# Patient Record
Sex: Male | Born: 1945 | Race: White | Hispanic: No | Smoking: Former smoker
Health system: Southern US, Community
[De-identification: ages and names within clinical notes are randomized; demographics above are authoritative.]

## PROBLEM LIST (undated history)

## (undated) DIAGNOSIS — K219 Gastro-esophageal reflux disease without esophagitis: Secondary | ICD-10-CM

## (undated) DIAGNOSIS — E785 Hyperlipidemia, unspecified: Secondary | ICD-10-CM

## (undated) DIAGNOSIS — I5189 Other ill-defined heart diseases: Secondary | ICD-10-CM

## (undated) DIAGNOSIS — R269 Unspecified abnormalities of gait and mobility: Secondary | ICD-10-CM

## (undated) DIAGNOSIS — I4891 Unspecified atrial fibrillation: Secondary | ICD-10-CM

## (undated) DIAGNOSIS — I429 Cardiomyopathy, unspecified: Secondary | ICD-10-CM

## (undated) DIAGNOSIS — B019 Varicella without complication: Secondary | ICD-10-CM

## (undated) DIAGNOSIS — D689 Coagulation defect, unspecified: Secondary | ICD-10-CM

## (undated) DIAGNOSIS — J301 Allergic rhinitis due to pollen: Secondary | ICD-10-CM

## (undated) DIAGNOSIS — S92901A Unspecified fracture of right foot, initial encounter for closed fracture: Secondary | ICD-10-CM

## (undated) DIAGNOSIS — M1990 Unspecified osteoarthritis, unspecified site: Secondary | ICD-10-CM

## (undated) DIAGNOSIS — M5030 Other cervical disc degeneration, unspecified cervical region: Secondary | ICD-10-CM

## (undated) DIAGNOSIS — S92919A Unspecified fracture of unspecified toe(s), initial encounter for closed fracture: Secondary | ICD-10-CM

## (undated) DIAGNOSIS — F329 Major depressive disorder, single episode, unspecified: Secondary | ICD-10-CM

## (undated) DIAGNOSIS — M2040 Other hammer toe(s) (acquired), unspecified foot: Secondary | ICD-10-CM

## (undated) DIAGNOSIS — R011 Cardiac murmur, unspecified: Secondary | ICD-10-CM

## (undated) DIAGNOSIS — G629 Polyneuropathy, unspecified: Secondary | ICD-10-CM

## (undated) DIAGNOSIS — I00 Rheumatic fever without heart involvement: Secondary | ICD-10-CM

## (undated) DIAGNOSIS — Q667 Congenital pes cavus, unspecified foot: Secondary | ICD-10-CM

## (undated) DIAGNOSIS — M5090 Cervical disc disorder, unspecified, unspecified cervical region: Secondary | ICD-10-CM

## (undated) DIAGNOSIS — L0501 Pilonidal cyst with abscess: Secondary | ICD-10-CM

## (undated) DIAGNOSIS — R001 Bradycardia, unspecified: Secondary | ICD-10-CM

## (undated) DIAGNOSIS — D485 Neoplasm of uncertain behavior of skin: Secondary | ICD-10-CM

## (undated) DIAGNOSIS — H9190 Unspecified hearing loss, unspecified ear: Secondary | ICD-10-CM

## (undated) DIAGNOSIS — J302 Other seasonal allergic rhinitis: Secondary | ICD-10-CM

## (undated) DIAGNOSIS — N400 Enlarged prostate without lower urinary tract symptoms: Secondary | ICD-10-CM

## (undated) DIAGNOSIS — R972 Elevated prostate specific antigen [PSA]: Secondary | ICD-10-CM

## (undated) DIAGNOSIS — F32A Depression, unspecified: Secondary | ICD-10-CM

## (undated) DIAGNOSIS — H9319 Tinnitus, unspecified ear: Secondary | ICD-10-CM

## (undated) HISTORY — DX: Cardiac murmur, unspecified: R01.1

## (undated) HISTORY — DX: Major depressive disorder, single episode, unspecified: F32.9

## (undated) HISTORY — DX: Coagulation defect, unspecified: D68.9

## (undated) HISTORY — DX: Unspecified fracture of unspecified toe(s), initial encounter for closed fracture: S92.919A

## (undated) HISTORY — DX: Varicella without complication: B01.9

## (undated) HISTORY — DX: Gastro-esophageal reflux disease without esophagitis: K21.9

## (undated) HISTORY — DX: Neoplasm of uncertain behavior of skin: D48.5

## (undated) HISTORY — DX: Tinnitus, unspecified ear: H93.19

## (undated) HISTORY — DX: Polyneuropathy, unspecified: G62.9

## (undated) HISTORY — DX: Hyperlipidemia, unspecified: E78.5

## (undated) HISTORY — DX: Cardiomyopathy, unspecified: I42.9

## (undated) HISTORY — DX: Bradycardia, unspecified: R00.1

## (undated) HISTORY — DX: Other cervical disc degeneration, unspecified cervical region: M50.30

## (undated) HISTORY — DX: Unspecified fracture of right foot, initial encounter for closed fracture: S92.901A

## (undated) HISTORY — DX: Allergic rhinitis due to pollen: J30.1

## (undated) HISTORY — DX: Other hammer toe(s) (acquired), unspecified foot: M20.40

## (undated) HISTORY — DX: Other ill-defined heart diseases: I51.89

## (undated) HISTORY — DX: Benign prostatic hyperplasia without lower urinary tract symptoms: N40.0

## (undated) HISTORY — DX: Cervical disc disorder, unspecified, unspecified cervical region: M50.90

## (undated) HISTORY — DX: Rheumatic fever without heart involvement: I00

## (undated) HISTORY — DX: Pilonidal cyst with abscess: L05.01

## (undated) HISTORY — DX: Unspecified hearing loss, unspecified ear: H91.90

## (undated) HISTORY — DX: Depression, unspecified: F32.A

## (undated) HISTORY — DX: Unspecified abnormalities of gait and mobility: R26.9

## (undated) HISTORY — DX: Unspecified atrial fibrillation: I48.91

## (undated) HISTORY — DX: Unspecified osteoarthritis, unspecified site: M19.90

## (undated) HISTORY — PX: INGUINAL HERNIA REPAIR: SUR1180

## (undated) HISTORY — DX: Congenital pes cavus, unspecified foot: Q66.70

## (undated) HISTORY — DX: Elevated prostate specific antigen (PSA): R97.20

## (undated) HISTORY — PX: WISDOM TOOTH EXTRACTION: SHX21

## (undated) HISTORY — DX: Other seasonal allergic rhinitis: J30.2

## (undated) HISTORY — PX: COLONOSCOPY: SHX174

---
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
## 1964-04-08 HISTORY — PX: PILONIDAL CYST EXCISION: SHX744

## 2009-12-09 ENCOUNTER — Emergency Department (HOSPITAL_COMMUNITY): Admission: EM | Admit: 2009-12-09 | Discharge: 2009-12-09 | Payer: Self-pay | Admitting: Family Medicine

## 2010-12-02 ENCOUNTER — Emergency Department (HOSPITAL_COMMUNITY)
Admission: EM | Admit: 2010-12-02 | Discharge: 2010-12-02 | Disposition: A | Discharge status: Home / Self Care | Payer: Medicare Other | Attending: Emergency Medicine | Admitting: Emergency Medicine

## 2010-12-02 ENCOUNTER — Emergency Department (HOSPITAL_COMMUNITY): Payer: Medicare Other

## 2010-12-02 DIAGNOSIS — R112 Nausea with vomiting, unspecified: Secondary | ICD-10-CM | POA: Insufficient documentation

## 2010-12-02 DIAGNOSIS — R42 Dizziness and giddiness: Secondary | ICD-10-CM | POA: Insufficient documentation

## 2015-12-12 ENCOUNTER — Ambulatory Visit (INDEPENDENT_AMBULATORY_CARE_PROVIDER_SITE_OTHER): Payer: Medicare PPO | Admitting: Adult Health

## 2015-12-12 ENCOUNTER — Encounter: Payer: Self-pay | Admitting: Adult Health

## 2015-12-12 VITALS — BP 130/84 | Temp 98.6°F | Ht 73.0 in | Wt 193.7 lb

## 2015-12-12 DIAGNOSIS — R011 Cardiac murmur, unspecified: Secondary | ICD-10-CM | POA: Diagnosis not present

## 2015-12-12 DIAGNOSIS — E785 Hyperlipidemia, unspecified: Secondary | ICD-10-CM | POA: Diagnosis not present

## 2015-12-12 DIAGNOSIS — Z7189 Other specified counseling: Secondary | ICD-10-CM

## 2015-12-12 DIAGNOSIS — Z7689 Persons encountering health services in other specified circumstances: Secondary | ICD-10-CM

## 2015-12-12 NOTE — Progress Notes (Signed)
Patient presents to clinic today to establish care. He is a pleasant 70 year old male who  has a past medical history of Arthritis; Chicken pox; Depression; Fracture of foot bone, right, closed (1964); GERD (gastroesophageal reflux disease); Heart murmur; Rheumatic fever; and Seasonal allergies.   His last physical was in August 2017.   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 He denies any CP, SOB, or palpitations. He does have a history of rheumatic fever.   Elevated Cholesterol  - During his last office visit at the [facility] ( 10/2015) it was found that his Triglycerides were 303. He endorses that this is from his decrease in physical activity and poor diet over the last year due to having to take care of his wife. He has since started to exercise and eat healthier.    Health Maintenance: Dental -- Routine  Vision --2015  Immunizations -- UTD  Colonoscopy -- 2008   If Followed by  - Neurology - at the [facility] - Dermatology - at the [facility]    Past Medical History:  Diagnosis Date  . Arthritis   . Chicken pox   . Depression   . Fracture of foot bone, right, closed 1964  . GERD (gastroesophageal reflux disease)   . Heart murmur   . Rheumatic fever   . Seasonal allergies     Past Surgical History:  Procedure Laterality Date  . WISDOM TOOTH EXTRACTION      No current outpatient prescriptions on file prior to visit.   No current facility-administered medications on file prior to visit.     No Known Allergies  Family History  Problem Relation Age of Onset  . Arthritis Mother   . Cancer Mother   . Arthritis Father   . Cancer Father   . Heart disease Father   . Stroke Father   . Hypertension Father   . Stroke Paternal Grandfather     Social History   Social History  . Marital status: Married    Spouse name: N/A  . Number of children: N/A  . Years of education: N/A     Occupational History  . Not on file.   Social History Main Topics  . Smoking status: Former Research scientist (life sciences)  . Smokeless tobacco: Former Systems developer  . Alcohol use Yes  . Drug use: Unknown  . Sexual activity: Not on file   Other Topics Concern  . Not on file   Social History Narrative  . No narrative on file    Review of Systems  Constitutional: Negative.   HENT: Negative.   Respiratory: Negative.   Cardiovascular: Negative.   Genitourinary: Negative.   Musculoskeletal: Negative.   Skin: Negative.   Neurological: Negative.   Psychiatric/Behavioral: Negative.   All other systems reviewed and are negative.   BP 130/84   Temp 98.6 F (37 C) (Oral)   Ht 6\' 1"  (1.854 m)   Wt 193 lb 11.2 oz (87.9 kg)   BMI 25.56 kg/m   Physical Exam  Constitutional: He is oriented to person, place, and time and well-developed, well-nourished, and in no distress. No distress.  HENT:  Head: Normocephalic and atraumatic.  Right Ear: External ear normal.  Left Ear: External ear normal.  Nose: Nose normal.  Mouth/Throat: Oropharynx is clear and moist. No oropharyngeal exudate.  Eyes: Conjunctivae and EOM are normal. Pupils are equal, round, and reactive to  light. Right eye exhibits no discharge. Left eye exhibits no discharge.  Cardiovascular: Normal rate, regular rhythm and intact distal pulses.  Exam reveals no gallop and no friction rub.   Murmur (slight systolic murmur ) heard. Pulmonary/Chest: Effort normal and breath sounds normal.  Abdominal: Soft. Bowel sounds are normal. He exhibits no distension and no mass. There is no tenderness. There is no rebound and no guarding.  Musculoskeletal: Normal range of motion. He exhibits no edema, tenderness or deformity.  Neurological: He is alert and oriented to person, place, and time. He has normal reflexes. He displays normal reflexes. No cranial nerve deficit. He exhibits normal muscle tone. Gait normal. Coordination normal. GCS score is 15.  Skin: Skin  is warm and dry. No rash noted. He is not diaphoretic. No erythema. No pallor.  Psychiatric: Mood, memory, affect and judgment normal.  Nursing note and vitals reviewed.   Assessment/Plan: 1. Encounter to establish care - Reviewed labs from his previous [facility] appointment. THese results were scanned in - Follow up for CPE - Follow up sooner for any acute issues - Encouraged heart healthy diet and exercise  2. Heart murmur - Referral to cardiology   3. Hyperlipidemia - Will recheck in 4 months  - Encouraged heart healthy diet and exercise - Consider placing on statin.   [name], NP

## 2015-12-12 NOTE — Patient Instructions (Signed)
It was great seeing you today   Someone from Cardiology will contact you to set up an appointment.   Please follow up with me in 4 months or so to have your cholesterol rechecked.   Follow up in July 2018 for your next physical. If you need anything in the meantime, please let me know.

## 2016-01-15 ENCOUNTER — Ambulatory Visit (INDEPENDENT_AMBULATORY_CARE_PROVIDER_SITE_OTHER): Payer: Medicare PPO | Admitting: Cardiovascular Disease

## 2016-01-15 ENCOUNTER — Encounter: Payer: Self-pay | Admitting: Cardiovascular Disease

## 2016-01-15 VITALS — BP 122/68 | HR 65 | Ht 73.0 in | Wt 197.0 lb

## 2016-01-15 DIAGNOSIS — R011 Cardiac murmur, unspecified: Secondary | ICD-10-CM | POA: Insufficient documentation

## 2016-01-15 DIAGNOSIS — R9431 Abnormal electrocardiogram [ECG] [EKG]: Secondary | ICD-10-CM | POA: Insufficient documentation

## 2016-01-15 DIAGNOSIS — R0602 Shortness of breath: Secondary | ICD-10-CM

## 2016-01-15 DIAGNOSIS — R0600 Dyspnea, unspecified: Secondary | ICD-10-CM | POA: Insufficient documentation

## 2016-01-15 DIAGNOSIS — E781 Pure hyperglyceridemia: Secondary | ICD-10-CM | POA: Diagnosis not present

## 2016-01-15 NOTE — Patient Instructions (Signed)
Your physician has requested that you have an echocardiogram. Echocardiography is a painless test that uses sound waves to create images of your heart. It provides your doctor with information about the size and shape of your heart and how well your heart's chambers and valves are working. This procedure takes approximately one hour. There are no restrictions for this procedure. This will be performed at our [facility] location - [address].  Dr Sallyanne Kuster recommends that you schedule a follow-up appointment in 2 months.  If you need a refill on your cardiac medications before your next appointment, please call your pharmacy.

## 2016-01-15 NOTE — Progress Notes (Signed)
Cardiology Consultation Note    Date:  01/15/2016   ID:  Marc Thornton, DOB 09-Feb-1946, MRN [id number]  PCP:  [name], NP  Cardiologist:   Sanda Klein, MD  Reason for consultation: murmur  Chief complaint: new murmur   History of Present Illness:  Marc Thornton is a 70 y.o. male without previous known cardiac illness, recently found to have a murmur on physical exam by his primary care provider. He reports that this has not been detected in the past.  He tries to minimize his symptoms, but he has been mowing his 3/4-acre lawn a quarter acre every day, since he develops shortness of breath and a twinge of discomfort in his chest if he tries to do more. It sounds like this has been progressively worsening slowly over the last year or so. He denies dizziness or syncope. He does not have shortness of breath or chest discomfort at rest. He insists that all his symptoms are related to poor sleep. His wife is seriously disabled and requires a lot of assistance to use the bathroom. She has nocturia so his sleep is interrupted 4-5 times every night. He denies leg edema, recent focal neurological complaints.  Marc Thornton reports that he had rheumatic fever at age 6. His father, who is a Therapist, music treated him with Pen-Vee K prophylaxis for the next many years and he did not have any exacerbations or heart injury. The only other time she has had contact with healthcare providers has been for was done to fix fraction pilonidal cyst surgery or an occasional injury. In 2012 was briefly evaluated in the emergency room for vertigo. He has otherwise been very healthy. He reports that his cholesterol has been "intermittently elevated".  He spent a year and a half in [place] in the [employer] and had some exposure to [employer]. He quit smoking 50 years ago. Between Marc Thornton and his wife they will have anywhere from a beer or 2 a day that they split. He denies use of recreational drugs. He does not  perform routine physical exercise.  There is a strong family history of cerebrovascular disease and dementia on his father's side. His paternal grandfather died at age 61 of a stroke. His father had triple bypass at age 53 and lived to be 76, after having a stroke at age 77. His mother died at age 53 from metastatic breast cancer. No family history of hypertrophic cardiomyopathy, congestive heart failure, unexplained sudden cardiac death or her ventricular arrhythmia  His ECG today shows sinus rhythm with very prominent changes of left ventricular hypertrophy and profound secondary repolarization abnormalities. There is also an unusually sudden transition to positive R waves in lead V2 and deep Q waves in lead aVL only. He reports having had electrocardiograms before, that were by his recollection all normal. He has never been told that he had an abnormal electrocardiogram.   Past Medical History:  Diagnosis Date  . Arthritis   . BPH (benign prostatic hyperplasia)   . Chicken pox   . DDD (degenerative disc disease), cervical   . Depression   . Elevated PSA   . Fracture of foot bone, right, closed 1964  . GERD (gastroesophageal reflux disease)   . Heart murmur   . Hyperlipidemia   . Polyneuropathy ([place])   . Rheumatic fever   . Seasonal allergies     Past Surgical History:  Procedure Laterality Date  . WISDOM TOOTH EXTRACTION      Current  Medications: No outpatient prescriptions prior to visit.   No facility-administered medications prior to visit.      Allergies:   Review of patient's allergies indicates no known allergies.   Social History   Social History  . Marital status: Married    Spouse name: N/A  . Number of children: N/A  . Years of education: N/A   Social History Main Topics  . Smoking status: Former Research scientist (life sciences)  . Smokeless tobacco: Former Systems developer  . Alcohol use Yes  . Drug use: No  . Sexual activity: Not Asked   Other Topics Concern  . None   Social History  Narrative   Worked as a Community education officer    Married           Family History:  The patient's family history includes Arthritis in his father and mother; Breast cancer in his mother; Dementia in his father; Heart disease in his father; Hypertension in his father; Prostate cancer in his father; Stroke in his father and paternal grandfather.   ROS:   Please see the history of present illness.    ROS All other systems reviewed and are negative.   PHYSICAL EXAM:   VS:  BP 122/68 (BP Location: Right Arm, Patient Position: Sitting, Cuff Size: Normal)   Pulse 65   Ht 6\' 1"  (1.854 m)   Wt 197 lb (89.4 kg)   SpO2 95%   BMI 25.99 kg/m    GEN: Well nourished, well developed, in no acute distress  HEENT: normal  Neck: no JVD, carotid bruits, or masses Cardiac: RRR; 2/6 mid peaking systolic ejection murmur heard best at the left lower sternal border, that appears to slightly diminish with handgrip and increase with Valsalva maneuver ; no diastolic murmurs, rubs, or gallops,no edema  Respiratory:  clear to auscultation bilaterally, normal work of breathing GI: soft, nontender, nondistended, + BS MS: no deformity or atrophy  Skin: warm and dry, no rash Neuro:  Alert and Oriented x 3, Strength and sensation are intact Psych: euthymic mood, full affect  Wt Readings from Last 3 Encounters:  01/15/16 197 lb (89.4 kg)  12/12/15 193 lb 11.2 oz (87.9 kg)      Studies/Labs Reviewed:   EKG:  EKG is ordered today.  The ekg ordered today demonstrates Normal sinus rhythm, prominent left ventricular hypertrophy, QRS 100 ms, sudden are as transition from V1 to V2, deep Q wave in aVL, prominent secondary repolarization abnormalities, normal QTC 405 ms.  LABS: [facility] 10/20/2015 Cholesterol 178, triglycerides 303, HDL 43, LDL 74, hemoglobin A1c 5.4%    ASSESSMENT:    1. Heart murmur   2. Shortness of breath   3. Abnormal ECG   4. Hypertriglyceridemia      PLAN:  In order of problems listed  above:  1. Murmur: I think the differential diagnosis is between valvular aortic stenosis and hypertrophic obstructive cardiomyopathy. Both his symptoms and the electrocardiogram could be explained by both. There is some dynamic component to his murmur, but not as convincing as one would expect with true HOCM. We should differently start with an echocardiogram. He should not have a stress test until after his echo. 2. Dyspnea: Most likely represents diastolic heart failure related to valvular heart disease and/or diastolic dysfunction. Echo will help. I am worried that Marc Thornton is minimizing his symptoms since he does not want to acknowledge being sick. He is aware of how important is to the care of his wife. 3. Abnormal ECG: He does not have  hypertension to explain the LVH changes. 4. HyperTG: In the absence of diabetes mellitus or known vascular disease, his mild-moderate hypertriglyceridemia does not require pharmacological therapy. He should limit his intake of sugars and starches and saturated fats and perform daily physical exercise. You exercise prescription after we have the results of his echo.    Medication Adjustments/Labs and Tests Ordered: Current medicines are reviewed at length with the patient today.  Concerns regarding medicines are outlined above.  Medication changes, Labs and Tests ordered today are listed in the Patient Instructions below. Patient Instructions  Your physician has requested that you have an echocardiogram. Echocardiography is a painless test that uses sound waves to create images of your heart. It provides your doctor with information about the size and shape of your heart and how well your heart's chambers and valves are working. This procedure takes approximately one hour. There are no restrictions for this procedure. This will be performed at our [facility] location - [address], [address].  Dr Sallyanne Kuster recommends that you schedule a follow-up appointment  in 2 months.  If you need a refill on your cardiac medications before your next appointment, please call your pharmacy.    Signed, Sanda Klein, MD  01/15/2016 6:00 PM    [address], [place], [place]  [address] Phone: [phone]; Fax: [phone]

## 2016-01-30 ENCOUNTER — Other Ambulatory Visit: Payer: Self-pay

## 2016-01-30 ENCOUNTER — Ambulatory Visit (HOSPITAL_COMMUNITY): Payer: Medicare PPO | Attending: Cardiology

## 2016-01-30 DIAGNOSIS — R0602 Shortness of breath: Secondary | ICD-10-CM | POA: Insufficient documentation

## 2016-01-30 DIAGNOSIS — R011 Cardiac murmur, unspecified: Secondary | ICD-10-CM | POA: Diagnosis not present

## 2016-02-02 ENCOUNTER — Telehealth: Payer: Self-pay | Admitting: Cardiovascular Disease

## 2016-02-02 DIAGNOSIS — R011 Cardiac murmur, unspecified: Secondary | ICD-10-CM

## 2016-02-02 NOTE — Telephone Encounter (Signed)
I did not order a Lexiscan. I ordered a treadmill Myoview.  A simple treadmill ECG test without imaging will not suffice,

## 2016-02-02 NOTE — Telephone Encounter (Signed)
Pt is calling to get test results

## 2016-02-02 NOTE — Telephone Encounter (Signed)
Spoke with pt and gave him results. He states that he can do the treadmill testing and would like to try to do it. And not the lexiscan. FYI

## 2016-02-05 NOTE — Telephone Encounter (Signed)
PT NOTIFIED  

## 2016-02-16 ENCOUNTER — Telehealth (HOSPITAL_COMMUNITY): Payer: Self-pay

## 2016-02-16 NOTE — Telephone Encounter (Signed)
Encounter complete. 

## 2016-02-21 ENCOUNTER — Inpatient Hospital Stay (HOSPITAL_COMMUNITY): Admission: RE | Admit: 2016-02-21 | Payer: Medicare PPO | Source: Ambulatory Visit

## 2016-02-23 ENCOUNTER — Telehealth (HOSPITAL_COMMUNITY): Payer: Self-pay

## 2016-02-23 NOTE — Telephone Encounter (Signed)
Encounter complete. 

## 2016-02-28 ENCOUNTER — Ambulatory Visit (HOSPITAL_COMMUNITY)
Admission: RE | Admit: 2016-02-28 | Discharge: 2016-02-28 | Disposition: A | Discharge status: Home / Self Care | Payer: Medicare PPO | Source: Ambulatory Visit | Attending: Internal Medicine | Admitting: Internal Medicine

## 2016-02-28 DIAGNOSIS — R011 Cardiac murmur, unspecified: Secondary | ICD-10-CM | POA: Insufficient documentation

## 2016-02-28 LAB — MYOCARDIAL PERFUSION IMAGING
CHL CUP MPHR: 149 {beats}/min
CHL CUP NUCLEAR SDS: 4
CHL CUP NUCLEAR SSS: 4
CHL RATE OF PERCEIVED EXERTION: 17
CSEPED: 4 min
CSEPEW: 5.6 METS
Exercise duration (sec): 33 s
LV dias vol: 111 mL (ref 62–150)
LVSYSVOL: 51 mL
NUC STRESS TID: 1.11
Peak HR: 137 {beats}/min
Percent HR: 91 %
Rest HR: 52 {beats}/min
SRS: 0

## 2016-02-28 MED ORDER — TECHNETIUM TC 99M TETROFOSMIN IV KIT
9.8000 | PACK | Freq: Once | INTRAVENOUS | Status: AC | PRN
  Administered 2016-02-28: 9.8 via INTRAVENOUS
  Filled 2016-02-28: qty 10

## 2016-02-28 MED ORDER — TECHNETIUM TC 99M TETROFOSMIN IV KIT
31.6000 | PACK | Freq: Once | INTRAVENOUS | Status: AC | PRN
  Administered 2016-02-28: 31.6 via INTRAVENOUS
  Filled 2016-02-28: qty 32

## 2016-03-21 ENCOUNTER — Ambulatory Visit (INDEPENDENT_AMBULATORY_CARE_PROVIDER_SITE_OTHER): Payer: Medicare PPO | Admitting: Cardiovascular Disease

## 2016-03-21 ENCOUNTER — Encounter: Payer: Self-pay | Admitting: Cardiovascular Disease

## 2016-03-21 VITALS — BP 130/80 | HR 86 | Ht 73.0 in | Wt 198.4 lb

## 2016-03-21 DIAGNOSIS — R9431 Abnormal electrocardiogram [ECG] [EKG]: Secondary | ICD-10-CM

## 2016-03-21 DIAGNOSIS — R011 Cardiac murmur, unspecified: Secondary | ICD-10-CM | POA: Diagnosis not present

## 2016-03-21 DIAGNOSIS — E852 Heredofamilial amyloidosis, unspecified: Secondary | ICD-10-CM | POA: Diagnosis not present

## 2016-03-21 DIAGNOSIS — E781 Pure hyperglyceridemia: Secondary | ICD-10-CM

## 2016-03-21 DIAGNOSIS — Z01812 Encounter for preprocedural laboratory examination: Secondary | ICD-10-CM

## 2016-03-21 DIAGNOSIS — I5032 Chronic diastolic (congestive) heart failure: Secondary | ICD-10-CM | POA: Diagnosis not present

## 2016-03-21 NOTE — Patient Instructions (Addendum)
Medication Instructions: Dr Sallyanne Kuster recommends that you continue on your current medications as directed. Please refer to the Current Medication list given to you today.  Labwork: Your physician recommends that you return for lab work prior to Cardiac MRI - within 30 days.  Testing/Procedures: 1. 24-hour Blood Pressure Monitor - This will be placed at our [facility] office - [address] [place] physician has requested that you have a cardiac MRI. Cardiac MRI uses a computer to create images of your heart as its beating, producing both still and moving pictures of your heart and major blood vessels. For further information please visit [email or web address] Please follow the instruction sheet given to you today for more information. This will be performed at [facility].  Follow-up: Dr Sallyanne Kuster recommends that you schedule a follow-up appointment in 3 months.  If you need a refill on your cardiac medications before your next appointment, please call your pharmacy.

## 2016-03-21 NOTE — Progress Notes (Signed)
Cardiology Office Note    Date:  03/21/2016   ID:  Marc Thornton, DOB 10-25-1945, MRN [id number]  PCP:  [name], NP  Cardiologist:   Sanda Klein, MD   Chief complaint: Follow-up echo and stress test   History of Present Illness:  Marc Thornton is a 70 y.o. male without previous known cardiac illness, recently found to have a murmur on physical exam by his primary care provider. Returns to follow-up on the results of his echocardiogram and nuclear treadmill stress test  The nuclear perfusion images were normal. He was only able to exercise for 4-1/2 minutes (5.6 METS) before he reached a heart rate of 149 bpm. He had a hypertensive response to exercise with a diastolic blood pressure that increased to 107 mmHg. Exercise performance was good. His echocardiogram did not show evidence of hypertrophic obstructive cardiomyopathy or valvular aortic stenosis. Left ventricular ejection fraction was normal. There was indeed evidence of diastolic dysfunction. The mitral annulus diastolic velocities were markedly reduced and the E/e' ratio was borderline significant for elevated filling pressures. Surprisingly, the measurements did not show thickening of the ventricular walls or left atrial enlargement.  He spent a year and a half in [place] in the [employer] and had some exposure to [employer]. He quit smoking 50 years ago.   There is a strong family history of cerebrovascular disease and dementia on his father's side. His paternal grandfather died at age 57 of a stroke. His father had triple bypass at age 6 and lived to be 19, after having a stroke at age 58. His mother died at age 18 from metastatic breast cancer. No family history of hypertrophic cardiomyopathy, congestive heart failure, unexplained sudden cardiac death or her ventricular arrhythmia. He believes his father had amyloid angiopathy and whether this might be hereditary.  His ECG today shows sinus rhythm with very prominent changes  of left ventricular hypertrophy and profound secondary repolarization abnormalities. There is also an unusually sudden transition to positive R waves in lead V2 and deep Q waves in lead aVL only. He reports having had electrocardiograms before, that were by his recollection all normal. He has never been told that he had an abnormal electrocardiogram.   Past Medical History:  Diagnosis Date  . Arthritis   . BPH (benign prostatic hyperplasia)   . Chicken pox   . DDD (degenerative disc disease), cervical   . Depression   . Elevated PSA   . Fracture of foot bone, right, closed 1964  . GERD (gastroesophageal reflux disease)   . Heart murmur   . Hyperlipidemia   . Polyneuropathy ([place])   . Rheumatic fever   . Seasonal allergies     Past Surgical History:  Procedure Laterality Date  . WISDOM TOOTH EXTRACTION      Current Medications: Outpatient Medications Prior to Visit  Medication Sig Dispense Refill  . aspirin 325 MG tablet Take 325 mg by mouth daily.    Marland Kitchen ibuprofen (ADVIL,MOTRIN) 200 MG tablet Take 200 mg by mouth every 6 (six) hours as needed.     No facility-administered medications prior to visit.      Allergies:   Patient has no known allergies.   Social History   Social History  . Marital status: Married    Spouse name: N/A  . Number of children: N/A  . Years of education: N/A   Social History Main Topics  . Smoking status: Former Research scientist (life sciences)  . Smokeless tobacco: Former Systems developer  . Alcohol  use Yes  . Drug use: No  . Sexual activity: Not Asked   Other Topics Concern  . None   Social History Narrative   Worked as a Community education officer    Married           Family History:  The patient's family history includes Arthritis in his father and mother; Breast cancer in his mother; Dementia in his father; Heart disease in his father; Hypertension in his father; Prostate cancer in his father; Stroke in his father and paternal grandfather.   ROS:   Please see the history of present  illness.    ROS All other systems reviewed and are negative.   PHYSICAL EXAM:   VS:  BP 130/80 (BP Location: Left Arm, Patient Position: Sitting, Cuff Size: Normal)   Pulse 86   Ht 6\' 1"  (1.854 m)   Wt 198 lb 6.4 oz (90 kg)   SpO2 96%   BMI 26.18 kg/m    GEN: Well nourished, well developed, in no acute distress  HEENT: normal  Neck: no JVD, carotid bruits, or masses Cardiac: RRR; 2/6 mid peaking systolic ejection murmur heard best at the left lower sternal border, that appears to slightly diminish with handgrip and increase with Valsalva maneuver ; no diastolic murmurs, rubs, or gallops,no edema  Respiratory:  clear to auscultation bilaterally, normal work of breathing GI: soft, nontender, nondistended, + BS MS: no deformity or atrophy  Skin: warm and dry, no rash Neuro:  Alert and Oriented x 3, Strength and sensation are intact Psych: euthymic mood, full affect  Wt Readings from Last 3 Encounters:  03/21/16 198 lb 6.4 oz (90 kg)  02/28/16 197 lb (89.4 kg)  01/15/16 197 lb (89.4 kg)      Studies/Labs Reviewed:   EKG:  EKG is not ordered today.  The ekg ordered At his previous appointment demonstrates Normal sinus rhythm, prominent left ventricular hypertrophy, QRS 100 ms, sudden are as transition from V1 to V2, deep Q wave in aVL, prominent secondary repolarization abnormalities, normal QTC 405 ms.  LABS: [facility] 10/20/2015 Cholesterol 178, triglycerides 303, HDL 43, LDL 74, hemoglobin A1c 5.4%    ASSESSMENT:    1. Chronic diastolic heart failure ([place])   2. Abnormal ECG   3. Murmur, cardiac   4. Hypertriglyceridemia   5. Amyloidosis, hereditary ([place])   6. Pre-procedure lab exam      PLAN:  In order of problems listed above:   1. CHF: his dyspnea most likely represents diastolic heart failure related to diastolic dysfunction. I think the treadmill tests confirm that his exercise capacity is more limited than he is willing to admit. What remains unclear is  whether or not this is simply due to latent and untreated hypertension (more likely from a statistical point of view) or due to an infiltrative cardiomyopathy such as amyloidosis (a diagnosis which he favors and which he has researched intensively). I have told him that typically with amyloidosis the electrocardiogram shows low voltage, and that his ECG shows the typical high-voltage of left ventricular hypertrophy. I think it is more likely that he has high blood pressure heart changes or even a primary hypertrophic cardiomyopathy. We'll schedule for cardiac MRI and a 24-hour blood pressure monitor. He would like referral to a free genetic screening test for hATTR amyloidosis via [place]. Since there is no evidence of LV outflow tract obstruction, but he does have some suggestion of diastolic heart failure, I would recommend initial treatment with a diuretic such  as chlorthalidone. He at least should limit increased sodium intake in his diet, which he admits is a problem.  2. Abnormal ECG: He does not have resting hypertension, but does have a hypertensive response to exercise which might explain the LVH changes. 3. Murmur: There was no evidence of left ventricular outflow tract obstruction or systolic anterior motion of the mitral valve on his echocardiogram. There was also no evidence of valvular aortic stenosis. Provocative maneuvers were not performed, suspect his murmur simply represent aortic valve sclerosis. 4. HyperTG: In the absence of diabetes mellitus or known vascular disease, his mild-moderate hypertriglyceridemia does not require pharmacological therapy. He should limit his intake of sugars and starches and saturated fats and perform daily physical exercise. You exercise prescription after we have the results of his echo. 5. Possible amyloidosis: Pointed out the fact that there is no relationship between cerebral amyloid angiopathy which is an isolated cerebrovascular phenomenon and systemic  amyloidosis. Therefore his father's medical problems likely have little bearing on his current condition. 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    Medication Adjustments/Labs and Tests Ordered: Current medicines are reviewed at length with the patient today.  Concerns regarding medicines are outlined above.  Medication changes, Labs and Tests ordered today are listed in the Patient Instructions below. Patient Instructions  Medication Instructions: Dr Sallyanne Kuster recommends that you continue on your current medications as directed. Please refer to the Current Medication list given to you today.  Labwork: Your physician recommends that you return for lab work prior to Cardiac MRI - within 30 days.  Testing/Procedures: 1. 24-hour Blood Pressure Monitor - This will be placed at our [facility] office - [address], [place] physician has requested that you have a cardiac MRI. Cardiac MRI uses a computer to create images of your heart as its beating, producing both still and moving pictures of your heart and major blood vessels. For further information please visit [email or web address] Please follow the instruction sheet given to you today for more information. This will be performed at [place].  Follow-up: Dr Sallyanne Kuster recommends that you schedule a follow-up appointment in 3 months.  If you need a refill on your cardiac medications before your next appointment, please call your pharmacy.    Signed, Sanda Klein, MD  03/21/2016 5:58 PM    [address], [place], [place]  [address] Phone: [phone]; Fax: [phone]

## 2016-03-26 ENCOUNTER — Encounter: Payer: Self-pay | Admitting: Cardiovascular Disease

## 2016-03-26 ENCOUNTER — Telehealth: Payer: Self-pay

## 2016-03-26 DIAGNOSIS — E781 Pure hyperglyceridemia: Secondary | ICD-10-CM

## 2016-03-26 NOTE — Telephone Encounter (Signed)
After office visit on 03/21/16, patient reported that back in the summer he had his cholesterol check and his triglycerides were elevated.  Discussed this finding with Dr C after patient was let go. Dr C okay with ordering lipid panel to be drawn with his labs for his upcoming cardiac MRI.  Additional paperwork mailed to patient.

## 2016-04-04 ENCOUNTER — Telehealth: Payer: Self-pay | Admitting: Cardiovascular Disease

## 2016-04-04 NOTE — Telephone Encounter (Signed)
Called the patient and gave him the date, time and location of cardiac MRI.

## 2016-04-09 ENCOUNTER — Ambulatory Visit (HOSPITAL_COMMUNITY): Admission: RE | Admit: 2016-04-09 | Payer: Medicare PPO | Source: Ambulatory Visit

## 2016-04-09 ENCOUNTER — Telehealth: Payer: Self-pay | Admitting: Cardiovascular Disease

## 2016-04-09 NOTE — Telephone Encounter (Signed)
Got a staff message from Mack Guise saying that she got a VM from the patient wanting to reschedule his cardiac MRI that he cancelled this morning.  He wants an afternoon appointment.  I sent a staff message to [facility] and precert to reschedule MRI.

## 2016-04-19 ENCOUNTER — Telehealth: Payer: Self-pay | Admitting: Cardiovascular Disease

## 2016-04-19 ENCOUNTER — Encounter: Payer: Self-pay | Admitting: Cardiovascular Disease

## 2016-04-19 NOTE — Telephone Encounter (Signed)
Called patient and gave him date, time and location of cardiac MRI.  Also sent message to check on precert of MRI.

## 2016-04-23 ENCOUNTER — Telehealth: Payer: Self-pay | Admitting: Cardiovascular Disease

## 2016-04-23 NOTE — Telephone Encounter (Signed)
Spoke to patient who recounted detailed narrative for my own knowledge regarding his prior tests and those findings. Notes echo showing diastolic dysfunction Notes the neg stress test but findings of hypertensive response to exercise  He had two questions/comments related to the upcoming MRI slated for 1/22. #1 - he's unclear of what additional information the MRI will provide #2 - had concern r/t his demyelinating peripheral polyneuropathy and what impact the contrast chemical will have on this.  Aware Dr. Sallyanne Kuster is out of office but he wanted his advice - voiced he will wait for response.

## 2016-04-23 NOTE — Telephone Encounter (Signed)
Marc Thornton is calling because he has some questions about his upcoming MRI . Please call

## 2016-04-26 NOTE — Telephone Encounter (Signed)
Patient came by office today, to see if he can get answer from previous question - why does he need this cardiac MRI.  2- How will it change in his diagnosis and treatment.  3- he states he was not given a full explanation about the test. 4- if the contrast will interfere with his diagnosis of  Amyloidosis.  5 cost factor?and will do labs when reschedule.  RN spoke to patient , informed Dr Sallyanne Kuster not in office , message is awaiting his return. Suggest to reschedule MRI   Until question are answered. Patient request activation code for [place] to reschedule. Both given to patient . Patient verbalized understanding .

## 2016-04-29 ENCOUNTER — Ambulatory Visit (HOSPITAL_COMMUNITY): Payer: Medicare PPO

## 2016-05-22 ENCOUNTER — Ambulatory Visit (HOSPITAL_COMMUNITY): Payer: Medicare PPO

## 2016-05-27 ENCOUNTER — Ambulatory Visit (HOSPITAL_COMMUNITY): Admission: RE | Admit: 2016-05-27 | Payer: Medicare PPO | Source: Ambulatory Visit

## 2016-05-28 ENCOUNTER — Ambulatory Visit (INDEPENDENT_AMBULATORY_CARE_PROVIDER_SITE_OTHER): Payer: Medicare PPO | Admitting: Adult Health

## 2016-05-28 ENCOUNTER — Encounter: Payer: Self-pay | Admitting: Adult Health

## 2016-05-28 VITALS — Temp 98.1°F | Wt 203.1 lb

## 2016-05-28 DIAGNOSIS — R6889 Other general symptoms and signs: Secondary | ICD-10-CM

## 2016-05-28 NOTE — Progress Notes (Signed)
Subjective:    Patient ID: Marc Thornton, male    DOB: Dec 20, 1945, 71 y.o.   MRN: [id number]  HPI  71 year old male who  has a past medical history of Arthritis; BPH (benign prostatic hyperplasia); Chicken pox; DDD (degenerative disc disease), cervical; Depression; Elevated PSA; Fracture of foot bone, right, closed (1964); GERD (gastroesophageal reflux disease); Heart murmur; Hyperlipidemia; Polyneuropathy ([place]); Rheumatic fever; and Seasonal allergies. He presents to the office today with the acute complaint of flu like symptoms that seem to be improving. His symptoms include that of non productive cough, generalized body aches, and fatigue. He denies any low grade fevers, nausea, or vomiting. He feels better today than he has in the past.   He has been using Mucinex with relief.    Review of Systems  Constitutional: Positive for activity change and fatigue.  HENT: Negative.   Respiratory: Positive for cough.   Musculoskeletal: Positive for myalgias.  All other systems reviewed and are negative.  Past Medical History:  Diagnosis Date  . Arthritis   . BPH (benign prostatic hyperplasia)   . Chicken pox   . DDD (degenerative disc disease), cervical   . Depression   . Elevated PSA   . Fracture of foot bone, right, closed 1964  . GERD (gastroesophageal reflux disease)   . Heart murmur   . Hyperlipidemia   . Polyneuropathy ([place])   . Rheumatic fever   . Seasonal allergies     Social History   Social History  . Marital status: Married    Spouse name: N/A  . Number of children: N/A  . Years of education: N/A   Occupational History  . Not on file.   Social History Main Topics  . Smoking status: Former Research scientist (life sciences)  . Smokeless tobacco: Former Systems developer  . Alcohol use Yes  . Drug use: No  . Sexual activity: Not on file   Other Topics Concern  . Not on file   Social History Narrative   Worked as a Community education officer    Married          Past Surgical History:  Procedure Laterality  Date  . WISDOM TOOTH EXTRACTION      Family History  Problem Relation Age of Onset  . Arthritis Mother   . Breast cancer Mother   . Arthritis Father   . Heart disease Father   . Stroke Father   . Hypertension Father   . Prostate cancer Father   . Dementia Father   . Stroke Paternal Grandfather     No Known Allergies  Current Outpatient Prescriptions on File Prior to Visit  Medication Sig Dispense Refill  . aspirin 325 MG tablet Take 325 mg by mouth daily.    Marland Kitchen ibuprofen (ADVIL,MOTRIN) 200 MG tablet Take 200 mg by mouth every 6 (six) hours as needed.     No current facility-administered medications on file prior to visit.     Temp 98.1 F (36.7 C) (Oral)   Wt 203 lb 1.6 oz (92.1 kg)   BMI 26.80 kg/m       Objective:   Physical Exam  Constitutional: He is oriented to person, place, and time. He appears well-developed and well-nourished. No distress.  Cardiovascular: Normal rate, regular rhythm, normal heart sounds and intact distal pulses.  Exam reveals no gallop and no friction rub.   No murmur heard. Pulmonary/Chest: Effort normal and breath sounds normal. No respiratory distress. He has no wheezes. He has no rales. He exhibits no  tenderness.  Neurological: He is alert and oriented to person, place, and time.  Skin: Skin is warm and dry. No rash noted. He is not diaphoretic. No erythema. No pallor.  Psychiatric: He has a normal mood and affect. His behavior is normal. Judgment and thought content normal.  Nursing note and vitals reviewed.     Assessment & Plan:  1. Flu-like symptoms - Has been improving. I do not see a need to do a flu swab today  - Stay hydrated and rest - Can take tylenol or motrin for symptom relief.  - Follow up as needed  [name], NP

## 2016-05-31 ENCOUNTER — Encounter: Payer: Self-pay | Admitting: Family Medicine

## 2016-05-31 ENCOUNTER — Ambulatory Visit (INDEPENDENT_AMBULATORY_CARE_PROVIDER_SITE_OTHER): Payer: Medicare PPO | Admitting: Family Medicine

## 2016-05-31 VITALS — BP 138/82 | HR 80 | Temp 100.4°F | Wt 200.8 lb

## 2016-05-31 DIAGNOSIS — R52 Pain, unspecified: Secondary | ICD-10-CM

## 2016-05-31 DIAGNOSIS — J101 Influenza due to other identified influenza virus with other respiratory manifestations: Secondary | ICD-10-CM | POA: Diagnosis not present

## 2016-05-31 DIAGNOSIS — R350 Frequency of micturition: Secondary | ICD-10-CM | POA: Diagnosis not present

## 2016-05-31 LAB — POC URINALSYSI DIPSTICK (AUTOMATED)
Bilirubin, UA: NEGATIVE
GLUCOSE UA: NEGATIVE
Ketones, UA: NEGATIVE
LEUKOCYTES UA: NEGATIVE
NITRITE UA: NEGATIVE
PH UA: 5.5
Protein, UA: NEGATIVE
RBC UA: NEGATIVE
Spec Grav, UA: 1.01
Urobilinogen, UA: 0.2

## 2016-05-31 LAB — POCT INFLUENZA A: RAPID INFLUENZA A AGN: POSITIVE

## 2016-05-31 NOTE — Progress Notes (Signed)
Pre visit review using our clinic review tool, if applicable. No additional management support is needed unless otherwise documented below in the visit note. 

## 2016-05-31 NOTE — Progress Notes (Signed)
Subjective:    Patient ID: Marc Thornton, male    DOB: [birth year], 71 y.o.   MRN: [id number]  HPI  Marc Thornton is a 71 year old male with PMH of Arthritis; BPH (benign prostatic hyperplasia); Chicken pox; DDD (degenerative disc disease), cervical; Depression; Elevated PSA; Fracture of foot bone, right, closed (1964); GERD (gastroesophageal reflux disease); Heart murmur; Hyperlipidemia; Polyneuropathy ([place]); Rheumatic fever; and Seasonal allergies He presents today with urinary frequency and urgency that started 2 weeks ago. No associated dysuria. He denies hematuria, flank pain, N/V/D. History of BPH and he is followed by urology with the [facility].  He has not seen this provider "in some time" Associated fever of 100.4 F, productive cough with yellow sputum, mild headache and body aches. He was seen in the office 3 days ago for flu like symptoms of nonproductive cough, generalized body aches, and fatigue. Treatment at home with Mucinex has provided limited benefit. He reports that these symptoms remain present but are improving.   Review of Systems  Constitutional: Positive for fatigue and fever.  HENT: Positive for rhinorrhea. Negative for ear pain, postnasal drip, sinus pain, sinus pressure and sore throat.   Respiratory: Positive for cough. Negative for shortness of breath and wheezing.   Cardiovascular: Negative for chest pain and palpitations.  Gastrointestinal: Negative for abdominal pain, diarrhea, nausea and vomiting.  Genitourinary: Positive for frequency and urgency. Negative for hematuria.  Musculoskeletal: Positive for myalgias.  Skin: Negative for rash.  Neurological: Negative for dizziness, light-headedness and headaches.   Past Medical History:  Diagnosis Date  . Arthritis   . BPH (benign prostatic hyperplasia)   . Chicken pox   . DDD (degenerative disc disease), cervical   . Depression   . Elevated PSA   . Fracture of foot bone, right, closed 1964  . GERD (gastroesophageal  reflux disease)   . Heart murmur   . Hyperlipidemia   . Polyneuropathy ([place])   . Rheumatic fever   . Seasonal allergies      Social History   Social History  . Marital status: Married    Spouse name: N/A  . Number of children: N/A  . Years of education: N/A   Occupational History  . Not on file.   Social History Main Topics  . Smoking status: Former Research scientist (life sciences)  . Smokeless tobacco: Former Systems developer  . Alcohol use Yes  . Drug use: No  . Sexual activity: Not on file   Other Topics Concern  . Not on file   Social History Narrative   Worked as a Community education officer    Married          Past Surgical History:  Procedure Laterality Date  . WISDOM TOOTH EXTRACTION      Family History  Problem Relation Age of Onset  . Arthritis Mother   . Breast cancer Mother   . Arthritis Father   . Heart disease Father   . Stroke Father   . Hypertension Father   . Prostate cancer Father   . Dementia Father   . Stroke Paternal Grandfather     No Known Allergies  Current Outpatient Prescriptions on File Prior to Visit  Medication Sig Dispense Refill  . aspirin 325 MG tablet Take 325 mg by mouth daily.    Marland Kitchen ibuprofen (ADVIL,MOTRIN) 200 MG tablet Take 200 mg by mouth every 6 (six) hours as needed.     No current facility-administered medications on file prior to visit.     BP 138/82 (BP  Location: Left Arm, Patient Position: Sitting, Cuff Size: Normal)   Pulse 80   Temp (!) 100.4 F (38 C) (Oral)   Wt 200 lb 12.8 oz (91.1 kg)   SpO2 97%   BMI 26.49 kg/m        Objective:   Physical Exam  Constitutional: He is oriented to person, place, and time. He appears well-developed and well-nourished.  Eyes: Pupils are equal, round, and reactive to light. No scleral icterus.  Neck: Neck supple.  Cardiovascular: Normal rate and regular rhythm.   Pulmonary/Chest: Effort normal and breath sounds normal. He has no wheezes. He has no rales.  Abdominal: Soft. Bowel sounds are normal. There is no  tenderness. There is no CVA tenderness.  Genitourinary:  Genitourinary Comments: Declined GU exam. He plans to follow up with urology  Lymphadenopathy:    He has no cervical adenopathy.  Neurological: He is alert and oriented to person, place, and time. Coordination normal.  Skin: Skin is warm and dry. No rash noted.          Assessment & Plan:  1. Influenza A Positive for influenza A and B; he is outside of the window for Tamiflu. He stated that he declined Tamiflu earlier in the week so is supportive of comfort measures for symptoms. Advised patient on supportive measures:  Get rest, drink plenty of fluids, and use tylenol or ibuprofen as needed for pain. Follow up if fever >101, if symptoms worsen or if symptoms are not improved in 3 days. Patient verbalizes understanding.   2. Influenza B Positive Influenza B  3. Frequency of urination Negative leukocytes, nitrites, UA is unremarkable; Suspect that symptoms are related to BPH. He reports that symptoms have been ongoing for 2 to weeks and these are not related to recent flu-like illness and have not worsened since flu symptoms started. He will make an appointment with his urologist for evaluation. No evidence of infection and other symptoms are related to influenza A/B.  - POCT Urinalysis Dipstick (Automated)  4. Body aches Advised patient on supportive measures:  Get rest, drink plenty of fluids, and use tylenol or ibuprofen as needed for pain. Follow up if fever >101, if symptoms worsen or if symptoms are not improved in 3 to 4 days. Patient verbalizes understanding.   - POCT Influenza A  We reviewed symptoms for follow up including pain with urination, hematuria, N/V, or back pain.   Delano Metz, FNP-C

## 2016-05-31 NOTE — Patient Instructions (Signed)
Your symptoms are most likely related to influenza B. Please drink plenty of water so that your urine is pale yellow or clear. Also, get plenty of rest, use tylenol or ibuprofen as needed for discomfort and follow up if symptoms do not improve in 3 to 4 days, worsen, or you develop a fever >101.  Also, your urinary symptoms are most likely related to your enlarge prostate. Please schedule a follow up with your urologist for further evaluation.  If you develop symptoms of pain with urination, nausea, vomiting, or back pain, please follow up with [facility] for further evaluation and treatment.   Avian Influenza, Adult Avian influenza is also known as bird flu. Avian influenza is a group of viruses that occur naturally in wild and domestic birds. Avian influenza is easily spread (contagious) among birds and is deadly to them. Birds become infected when they come into contact with infected birds or contaminated surfaces. The avian influenza viruses are spread from country to country through the international poultry trade or by migrating birds. Although rare, avian influenza can cause severe illness in humans. It is also rare for there to be human-to-human transmission of avian influenza. However, influenza viruses can change, so human-to-human transmission may become more likely in the future. What are the causes? This condition is caused by infected birds that spread avian influenza through their feces, saliva, and fluids from the nose (nasal secretions). Avian influenza can then infect humans who:  Come in contact with infected birds. This can happen with a bird that is dead or alive.  Breathe in contaminated dust.  Touch contaminated surfaces. What increases the risk? This condition is more likely to develop in people who come in close contact with birds or poultry. What are the signs or symptoms? Symptoms of this condition include:  Fever.  Cough.  Sore throat.  Nausea and  vomiting.  Diarrhea.  Muscle aches.  Tiredness (fatigue).  Inflammation or redness of the eyes (conjunctivitis).  Shortness of breath.  Difficulty breathing.  Pain in the abdomen.  Seizures.  Mental confusion. How is this diagnosed? This condition may be diagnosed by:  Medical history and physical exam.  Chest X-ray.  Examination of a fluid sample from your throat or nose.  Blood tests. How is this treated? This condition may be treated by:  Medicines that stop the growth of the viruses in your body (antiviral medicines).  Supportive care to relieve your symptoms. Follow these instructions at home:  Take over-the-counter and prescription medicines only as told by your health care provider.  Use a cool mist humidifier. This makes breathing easier.  Rest as directed by your health care provider.  Drink enough fluid to keep your urine clear or pale yellow.  Cover your mouth and nose when coughing or sneezing.  Wash your hands well to prevent the viruses from spreading.  Avoid crowded areas. Stay home from work or school until directed by your health care provider. How is this prevented?  Stay home from work and school when you are sick. Not being in contact with other people will help stop the spread of illness.  Cover your mouth and nose with your arm when coughing or sneezing. This may help keep those around you from getting sick.  Wash your hands often with warm water and soap. Illnesses are often spread when a person touches something that is contaminated with germs and then touches his or her eyes, nose, or mouth.  If you think that you have been  exposed to avian influenza, ask your health care provider about preventive antiviral medicines. These can help prevent infection from occurring. Contact a health care provider if:  You have a fever.  You have a skin rash.  You have new symptoms.  Your symptoms get worse.  You are urinating noticeably  less or not at all.  Your symptoms do not get better with treatment. Get help right away if:  Your skin or nails turn bluish.  You have chest pain.  You have trouble breathing.  You feel like your heart is fluttering, skipping a beat, or beating faster than normal.  You become confused.  You have chills, weakness, or light-headedness.  You develop a sudden headache.  You develop pain in your face or ear.  You have severe pain or stiffness in your neck.  You cough up blood.  You have nausea and vomiting that you cannot control. This information is not intended to replace advice given to you by your health care provider. Make sure you discuss any questions you have with your health care provider. Document Released: 03/28/2003 Document Revised: 10/18/2015 Document Reviewed: 09/02/2015 Elsevier Interactive Patient Education  2017 [employer].

## 2017-02-11 ENCOUNTER — Encounter: Payer: Self-pay | Admitting: Adult Health

## 2017-02-11 ENCOUNTER — Ambulatory Visit: Payer: Medicare PPO | Admitting: Adult Health

## 2017-02-11 VITALS — BP 124/70 | Temp 98.6°F | Ht 73.0 in | Wt 194.0 lb

## 2017-02-11 DIAGNOSIS — M542 Cervicalgia: Secondary | ICD-10-CM | POA: Diagnosis not present

## 2017-02-11 NOTE — Progress Notes (Signed)
Subjective:    Patient ID: Marc Thornton, male    DOB: 09-Jul-1945, 70 y.o.   MRN: [id number]  HPI  71 year old male who  has a past medical history of Arthritis, BPH (benign prostatic hyperplasia), Chicken pox, DDD (degenerative disc disease), cervical, Depression, Elevated PSA, Fracture of foot bone, right, closed (1964), GERD (gastroesophageal reflux disease), Heart murmur, Hyperlipidemia, Polyneuropathy ([place]), Rheumatic fever, and Seasonal allergies.  He presents to the office today for the acute complaint of right sided neck pain x 2-3 weeks. Pain is located at right lower base of neck. Closest to trachea. He feels as though the right side of his neck is swollen as compared to the left. Denies any trouble swallowing or eating. Denies any change in voice   He has been sleeping on the couch and has some muscular pain in his right shoulder and right upper back as well    Review of Systems See HPI   Past Medical History:  Diagnosis Date  . Arthritis   . BPH (benign prostatic hyperplasia)   . Chicken pox   . DDD (degenerative disc disease), cervical   . Depression   . Elevated PSA   . Fracture of foot bone, right, closed 1964  . GERD (gastroesophageal reflux disease)   . Heart murmur   . Hyperlipidemia   . Polyneuropathy ([place])   . Rheumatic fever   . Seasonal allergies     Social History   Socioeconomic History  . Marital status: Married    Spouse name: Not on file  . Number of children: Not on file  . Years of education: Not on file  . Highest education level: Not on file  Social Needs  . Financial resource strain: Not on file  . Food insecurity - worry: Not on file  . Food insecurity - inability: Not on file  . Transportation needs - medical: Not on file  . Transportation needs - non-medical: Not on file  Occupational History  . Not on file  Tobacco Use  . Smoking status: Former Research scientist (life sciences)  . Smokeless tobacco: Former Network engineer and Sexual Activity  . Alcohol  use: Yes  . Drug use: No  . Sexual activity: Not on file  Other Topics Concern  . Not on file  Social History Narrative   Worked as a Community education officer    Married       Past Surgical History:  Procedure Laterality Date  . WISDOM TOOTH EXTRACTION      Family History  Problem Relation Age of Onset  . Arthritis Mother   . Breast cancer Mother   . Arthritis Father   . Heart disease Father   . Stroke Father   . Hypertension Father   . Prostate cancer Father   . Dementia Father   . Stroke Paternal Grandfather     No Known Allergies  Current Outpatient Medications on File Prior to Visit  Medication Sig Dispense Refill  . aspirin 325 MG tablet Take 325 mg by mouth daily.    Marland Kitchen ibuprofen (ADVIL,MOTRIN) 200 MG tablet Take 200 mg by mouth every 6 (six) hours as needed.     No current facility-administered medications on file prior to visit.     There were no vitals taken for this visit.      Objective:   Physical Exam  Constitutional: He appears well-developed and well-nourished. No distress.  Neck: Normal range of motion. Neck supple. No JVD present. No tracheal deviation present. No thyromegaly  present.  No noticeable swelling noted on right neck. He has pain with palpation around splenius capitus. No masses felt   Cardiovascular: Intact distal pulses.  Lymphadenopathy:    He has no cervical adenopathy.  Skin: He is not diaphoretic.  Nursing note and vitals reviewed.     Assessment & Plan:  1. Neck pain - appears more MSK in nature  - He has flexeril at home. Advised to try this for acouple of days  - Can take Motrin with this  - Follow up if no improvement   [name], NP

## 2017-02-11 NOTE — Patient Instructions (Signed)
I believe your symptoms are more muscular. As discussed try using Flexeril you have at home already   Follow up if no improvement in the next 2-3 days   Also follow up for your physical

## 2017-03-11 ENCOUNTER — Encounter: Payer: Self-pay | Admitting: Adult Health

## 2017-03-11 ENCOUNTER — Ambulatory Visit: Payer: Medicare PPO | Admitting: Adult Health

## 2017-03-11 VITALS — BP 126/70 | Temp 98.4°F | Wt 193.0 lb

## 2017-03-11 DIAGNOSIS — T148XXA Other injury of unspecified body region, initial encounter: Secondary | ICD-10-CM | POA: Diagnosis not present

## 2017-03-11 MED ORDER — CYCLOBENZAPRINE HCL 10 MG PO TABS: 10.0000 mg | ORAL_TABLET | Freq: Every day | ORAL | 0 refills | Status: DC

## 2017-03-11 NOTE — Patient Instructions (Signed)
Your exam appears to be more muscular in nature.   Alternate motrin and tylenol. I have sent in Flexeril to help with muscle pain.   You can use heat or ice... Which ever feels better

## 2017-03-11 NOTE — Progress Notes (Signed)
Subjective:    Patient ID: Marc Thornton, male    DOB: 1945/11/05, 71 y.o.   MRN: [id number]  HPI  71 year old male who  has a past medical history of Arthritis, BPH (benign prostatic hyperplasia), Chicken pox, DDD (degenerative disc disease), cervical, Depression, Elevated PSA, Fracture of foot bone, right, closed (1964), GERD (gastroesophageal reflux disease), Heart murmur, Hyperlipidemia, Polyneuropathy, Rheumatic fever, and Seasonal allergies. He presents to the office today for an acute issue of right hip pain. He reports falling seven days ago after slipping on a water spill at [employer]. He reports pain in his right lower back, right buttocks, and right hamstring. He was able to keep himself from hitting the ground.   Pain is worse with movement and changing positions. He has been using Tylenol without relief. Denies any bruising    Review of Systems See HPI   Past Medical History:  Diagnosis Date  . Arthritis   . BPH (benign prostatic hyperplasia)   . Chicken pox   . DDD (degenerative disc disease), cervical   . Depression   . Elevated PSA   . Fracture of foot bone, right, closed 1964  . GERD (gastroesophageal reflux disease)   . Heart murmur   . Hyperlipidemia   . Polyneuropathy   . Rheumatic fever   . Seasonal allergies     Social History   Socioeconomic History  . Marital status: Married    Spouse name: Not on file  . Number of children: Not on file  . Years of education: Not on file  . Highest education level: Not on file  Social Needs  . Financial resource strain: Not on file  . Food insecurity - worry: Not on file  . Food insecurity - inability: Not on file  . Transportation needs - medical: Not on file  . Transportation needs - non-medical: Not on file  Occupational History  . Not on file  Tobacco Use  . Smoking status: Former Research scientist (life sciences)  . Smokeless tobacco: Former Network engineer and Sexual Activity  . Alcohol use: Yes  . Drug use: No  . Sexual  activity: Not on file  Other Topics Concern  . Not on file  Social History Narrative   Worked as a Community education officer    Married       Past Surgical History:  Procedure Laterality Date  . WISDOM TOOTH EXTRACTION      Family History  Problem Relation Age of Onset  . Arthritis Mother   . Breast cancer Mother   . Arthritis Father   . Heart disease Father   . Stroke Father   . Hypertension Father   . Prostate cancer Father   . Dementia Father   . Stroke Paternal Grandfather     No Known Allergies  Current Outpatient Medications on File Prior to Visit  Medication Sig Dispense Refill  . acetaminophen (TYLENOL) 500 MG tablet Take 500 mg by mouth every 6 (six) hours as needed.    Marland Kitchen aspirin 325 MG tablet Take 325 mg by mouth daily.     No current facility-administered medications on file prior to visit.     BP 126/70 (BP Location: Left Arm)   Temp 98.4 F (36.9 C) (Oral)   Wt 193 lb (87.5 kg)   BMI 25.46 kg/m       Objective:   Physical Exam  Constitutional: He is oriented to person, place, and time. He appears well-developed and well-nourished. No distress.  Cardiovascular: Normal  rate, regular rhythm, normal heart sounds and intact distal pulses. Exam reveals no gallop and no friction rub.  No murmur heard. Pulmonary/Chest: Effort normal and breath sounds normal. No respiratory distress. He has no wheezes. He has no rales. He exhibits no tenderness.  Musculoskeletal: Normal range of motion. He exhibits tenderness. He exhibits no edema or deformity.  Pain to hamstring and gluteal on right side with straight leg raise and knee to chest. No pain to hip or sciatic nerve  No bruising noted      Neurological: He is alert and oriented to person, place, and time.  Skin: Skin is warm and dry. No rash noted. He is not diaphoretic. No erythema. No pallor.  Psychiatric: He has a normal mood and affect. His behavior is normal. Judgment and thought content normal.  Nursing note and  vitals reviewed.      Assessment & Plan:  1. Muscle strain - Exam appears to be muscular in nature.  - cyclobenzaprine (FLEXERIL) 10 MG tablet; Take 1 tablet (10 mg total) by mouth at bedtime.  Dispense: 30 tablet; Refill: 0 - Will prescribe flexeril QHS  - Ok to take motrin Q6H PRN and use heating pad  - Follow up if not resolved in the next week   [name], NP

## 2017-03-14 ENCOUNTER — Telehealth: Payer: Self-pay | Admitting: Adult Health

## 2017-03-14 NOTE — Telephone Encounter (Signed)
Copied from [place] [id number]. Topic: Quick Communication - See Telephone Encounter >> Mar 14, 2017 12:18 PM Boyd Kerbs wrote: CRM for notification. See Telephone encounter for:   Patient called wanting referral for [facility], He has had this before and it worked. He is in a lot of pain right now.  Or does someone in office can do this. 03/14/17.

## 2017-03-19 NOTE — Telephone Encounter (Signed)
Spoke to the pt.  Advised that Tommi Rumps was not aware of anyone who does IFT.  Advised pt that we could send him to PT for TENS therapy.  Pt stated he will think about it and call back.  Asked pt if Flexeril helped when prescribed on 03/11/17 for his hip and leg pain.  He did not think that it helped all that much.  Also stated that he did not get to use it much due to caring for his disabled wife.

## 2017-03-19 NOTE — Telephone Encounter (Signed)
Patient calling back, states he has found his notes

## 2017-03-19 NOTE — Telephone Encounter (Signed)
I do not know anyone that does IFT. I can send him to PT for TENS therapy

## 2017-03-20 NOTE — Telephone Encounter (Signed)
Returned Marc Thornton' phone call.  He informed me that Marc Thornton told him that he needed to schedule an Wellness Exam with Marc Thornton and a physical appointment with Marc Thornton.  He wanted Marc Thornton to know he tried to make these appointments 1-2 weeks ago but was unable.  He was very confused about the 2 appointments.  He then asked to speak to Marc Thornton directly.  Advised that I would pass along a message to have Marc Thornton call at his convenience.

## 2017-03-21 NOTE — Telephone Encounter (Signed)
Spoke to Harris about the difference between the two exams. Due to Nancy's significant medical issues going on right now, I do not think she needs to come in for her AWE. Her thyroid and lipid panel is checked at [facility] ( he will get me these labs). We will follow up for CPE in January or later if needed.

## 2017-03-24 ENCOUNTER — Ambulatory Visit (INDEPENDENT_AMBULATORY_CARE_PROVIDER_SITE_OTHER): Payer: Medicare PPO | Admitting: *Deleted

## 2017-03-24 DIAGNOSIS — Z23 Encounter for immunization: Secondary | ICD-10-CM | POA: Diagnosis not present

## 2017-03-26 ENCOUNTER — Other Ambulatory Visit: Payer: Self-pay | Admitting: Adult Health

## 2017-03-26 DIAGNOSIS — M79604 Pain in right leg: Secondary | ICD-10-CM

## 2017-04-09 ENCOUNTER — Ambulatory Visit: Payer: Medicare PPO | Attending: Adult Health | Admitting: Physical Therapy

## 2017-04-09 ENCOUNTER — Encounter: Payer: Self-pay | Admitting: Physical Therapy

## 2017-04-09 DIAGNOSIS — M6281 Muscle weakness (generalized): Secondary | ICD-10-CM | POA: Insufficient documentation

## 2017-04-09 DIAGNOSIS — M79651 Pain in right thigh: Secondary | ICD-10-CM | POA: Diagnosis not present

## 2017-04-09 DIAGNOSIS — R252 Cramp and spasm: Secondary | ICD-10-CM | POA: Diagnosis present

## 2017-04-09 NOTE — Therapy (Signed)
[facility] [address], [place], [place], [address] Phone: [phone]   Fax:  [phone]  Physical Therapy Evaluation  Patient Details  Name: Marc Thornton MRN: [id number] Date of Birth: [birth year] Referring Provider: Dr. [name]   Encounter Date: 04/09/2017  PT End of Session - 04/09/17 1530    Visit Number  1    Date for PT Re-Evaluation  06/04/17    Authorization Type  [place]    PT Start Time  1445    PT Stop Time  1545    PT Time Calculation (min)  60 min    Activity Tolerance  Patient tolerated treatment well    Behavior During Therapy  [facility] for tasks assessed/performed       Past Medical History:  Diagnosis Date  . Arthritis   . BPH (benign prostatic hyperplasia)   . Chicken pox   . DDD (degenerative disc disease), cervical   . Depression   . Elevated PSA   . Fracture of foot bone, right, closed 1964  . GERD (gastroesophageal reflux disease)   . Heart murmur   . Hyperlipidemia   . Polyneuropathy   . Rheumatic fever   . Seasonal allergies     Past Surgical History:  Procedure Laterality Date  . WISDOM TOOTH EXTRACTION      There were no vitals filed for this visit.   Subjective Assessment - 04/09/17 1450    Subjective  slipped on water on the floor when at [facility] with right leg forward.  Patient pulled the muscles in the back of the right leg.  The deep lower hamstring is uncomfortable with the pressure of the chair.     Patient Stated Goals  reduce pain in right leg    Currently in Pain?  Yes    Pain Score  6     Pain Location  Leg    Pain Orientation  Upper;Posterior    Pain Descriptors / Indicators  Aching    Pain Type  Acute pain    Pain Onset  More than a month ago    Pain Frequency  Intermittent    Aggravating Factors   when activating the right hamstring, sitting with pressure on the posterior right thigh    Pain Relieving Factors  change position    Multiple Pain  Sites  No         [facility] PT Assessment - 04/09/17 0001      Assessment   Medical Diagnosis  M79.604 Right leg pain    Referring Provider  Dr. [name]    Onset Date/Surgical Date  03/04/17    Prior Therapy  none      Precautions   Precautions  None      Restrictions   Weight Bearing Restrictions  No      Balance Screen   Has the patient fallen in the past 6 months  Yes    How many times?  1 slipped on water at [employer]      Prior Function   Level of [place]  Retired    Leisure  household chores, take care of wife      Cognition   Overall Cognitive Status  Within Functional Limits for tasks assessed      Observation/Other Assessments   Focus on Therapeutic Outcomes (FOTO)   46% limitation and goal is 34% limitation      Posture/Postural Control   Posture/Postural  Control  No significant limitations      ROM / Strength   AROM / PROM / Strength  AROM;PROM;Strength      Strength   Strength Assessment Site  Knee    Right Hip Extension  4/5    Right/Left Knee  Right    Right Knee Flexion  4-/5 pain    Right Knee Extension  5/5      Palpation   Palpation comment  tenderness located in posterior right hip laterally and distally of right hamstring             Objective measurements completed on examination: See above findings.      [facility] Adult PT Treatment/Exercise - 04/09/17 0001      Modalities   Modalities  Electrical Stimulation;Moist Heat      Moist Heat Therapy   Number Minutes Moist Heat  15 Minutes    Moist Heat Location  Other (comment) posterior right thigh      Electrical Stimulation   Electrical Stimulation Location  posterior right thigh    Electrical Stimulation Action  IFC    Electrical Stimulation Parameters  15 min, to patient tolerance    Electrical Stimulation Goals  Pain      Manual Therapy   Manual Therapy  Soft tissue mobilization    Soft tissue mobilization  right lower hamstring        Trigger Point Dry Needling - 04/09/17 1531    Consent Given?  Yes    Education Handout Provided  Yes    Muscles Treated Lower Body  Hamstring    Hamstring Response  Twitch response elicited;Palpable increased muscle length lower lateral right            PT Education - 04/09/17 1637    Education provided  Yes    Education Details  information on dry needling    Person(s) Educated  Patient    Methods  Explanation;Handout    Comprehension  Verbalized understanding       PT Short Term Goals - 04/09/17 1643      PT SHORT TERM GOAL #1   Title  able to sit with pressure on posterior right thigh with pain decreased >/= 25%    Time  4    Period  Weeks    Status  New    Target Date  05/07/17      PT SHORT TERM GOAL #2   Title  able to activate right hamstring with pain decreased >/= 25%    Time  4    Period  Weeks    Status  New    Target Date  05/07/17      PT SHORT TERM GOAL #3   Title  independent with initial HEP    Time  4    Period  Weeks    Status  New    Target Date  05/07/17        PT Long Term Goals - 04/09/17 1646      PT LONG TERM GOAL #1   Title  indepedent with HEP and how to progress himself    Time  8    Period  Weeks    Status  New    Target Date  06/04/17      PT LONG TERM GOAL #2   Title  able to sit with pressure on right posterior thigh with pain decreased >/= 80%    Time  8    Period  Weeks  Status  New    Target Date  06/04/17      PT LONG TERM GOAL #3   Title  able to contract right hamstring with minimal to no pain due to reduction of trigger points and strength 5/5    Time  8    Period  Weeks    Status  New    Target Date  06/04/17             Plan - 04/09/17 1637    Clinical Impression Statement  Patient is a 72 year old male with right leg pain that began when he slipped on water at [employer] and out stretced his right leg forward on 03/04/2017.  Patient reports 6/10 in distal right hamstring laterally that  is worse with activation of the right hamstring or pressure on it with sitting.  Right knee flexion 3+/5 and right hip extension. Palpable tenderness located in distal right lateral hamstring with increased tightness.  Patient would benefit from skilled therapy to improve right leg function while reducing the pain.     History and Personal Factors relevant to plan of care:  none that will affect patient with rehab    Clinical Presentation  Stable    Clinical Presentation due to:  stable condition    Clinical Decision Making  Low    Rehab Potential  Excellent    Clinical Impairments Affecting Rehab Potential  None    PT Frequency  1x / week    PT Duration  8 weeks    PT Treatment/Interventions  Cryotherapy;Electrical Stimulation;Iontophoresis 4mg /ml Dexamethasone;Moist Heat;Ultrasound;Therapeutic exercise;Therapeutic activities;Neuromuscular re-education;Patient/family education;Manual techniques;Dry needling;Taping    PT Next Visit Plan  see if dry needling helped the hamstring; soft tissue work to right hamstring; strength for right hip extension and hamstring; stretches to right hamstring, modalities as needed    PT Home Exercise Plan  progress as needed    Consulted and Agree with Plan of Care  Patient       Patient will benefit from skilled therapeutic intervention in order to improve the following deficits and impairments:  Increased fascial restricitons, Pain, Increased muscle spasms, Decreased strength, Decreased activity tolerance  Visit Diagnosis: Pain in right thigh - Plan: PT plan of care cert/re-cert  Muscle weakness (generalized) - Plan: PT plan of care cert/re-cert  Cramp and spasm - Plan: PT plan of care cert/re-cert     Problem List Patient Active Problem List   Diagnosis Date Noted  . Chronic diastolic heart failure ([place]) 03/21/2016  . Murmur, cardiac 01/15/2016  . Dyspnea 01/15/2016  . Abnormal ECG 01/15/2016  . Hypertriglyceridemia 01/15/2016    Earlie Counts,  PT 04/09/17 4:50 PM   [facility] [address], [place], [place], [address] Phone: [phone]   Fax:  [phone]  Name: Marc Thornton MRN: [id number] Date of Birth: [birth year]

## 2017-04-09 NOTE — Patient Instructions (Signed)

## 2017-04-14 ENCOUNTER — Encounter: Payer: Self-pay | Admitting: Physical Therapy

## 2017-04-14 ENCOUNTER — Ambulatory Visit: Payer: Medicare PPO | Admitting: Physical Therapy

## 2017-04-14 DIAGNOSIS — M79651 Pain in right thigh: Secondary | ICD-10-CM | POA: Diagnosis not present

## 2017-04-14 DIAGNOSIS — M6281 Muscle weakness (generalized): Secondary | ICD-10-CM

## 2017-04-14 DIAGNOSIS — R252 Cramp and spasm: Secondary | ICD-10-CM

## 2017-04-14 NOTE — Therapy (Signed)
[facility] [address], [place], [place], [address] Phone: [phone]   Fax:  [phone]  Physical Therapy Treatment  Patient Details  Name: Marc Thornton MRN: [id number] Date of Birth: 08/21/1945 Referring Provider: Dr. [name]   Encounter Date: 04/14/2017  PT End of Session - 04/14/17 1403    Visit Number  2    Date for PT Re-Evaluation  06/04/17    Authorization Type  [employer]    PT Start Time  1402    PT Stop Time  1502    PT Time Calculation (min)  60 min    Activity Tolerance  Patient tolerated treatment well    Behavior During Therapy  [facility] for tasks assessed/performed       Past Medical History:  Diagnosis Date  . Arthritis   . BPH (benign prostatic hyperplasia)   . Chicken pox   . DDD (degenerative disc disease), cervical   . Depression   . Elevated PSA   . Fracture of foot bone, right, closed 1964  . GERD (gastroesophageal reflux disease)   . Heart murmur   . Hyperlipidemia   . Polyneuropathy   . Rheumatic fever   . Seasonal allergies     Past Surgical History:  Procedure Laterality Date  . WISDOM TOOTH EXTRACTION      There were no vitals filed for this visit.  Subjective Assessment - 04/14/17 1404    Subjective  I feel like it is getting better. Feels like the needling really helps.     Currently in Pain?  No/denies    Multiple Pain Sites  No                      [facility] Adult PT Treatment/Exercise - 04/14/17 0001      Knee/Hip Exercises: Stretches   Active Hamstring Stretch  -- Seated 3x 30 sec      Knee/Hip Exercises: Aerobic   Recumbent Bike  L4 x 6 min PTA present to monitor       Knee/Hip Exercises: Prone   Hamstring Curl  3 sets;10 reps    Hamstring Curl Limitations  3# added, pt commented this was so easy...      Moist Heat Therapy   Number Minutes Moist Heat  15 Minutes    Moist Heat Location  Other (comment) posterior right thigh      Electrical  Stimulation   Electrical Stimulation Location  posterior right thigh    Electrical Stimulation Action  IFC    Electrical Stimulation Goals  Pain      Manual Therapy   Manual Therapy  Soft tissue mobilization    Soft tissue mobilization  RT hamstring proximal & distal               PT Short Term Goals - 04/09/17 1643      PT SHORT TERM GOAL #1   Title  able to sit with pressure on posterior right thigh with pain decreased >/= 25%    Time  4    Period  Weeks    Status  New    Target Date  05/07/17      PT SHORT TERM GOAL #2   Title  able to activate right hamstring with pain decreased >/= 25%    Time  4    Period  Weeks    Status  New    Target Date  05/07/17      PT SHORT  TERM GOAL #3   Title  independent with initial HEP    Time  4    Period  Weeks    Status  New    Target Date  05/07/17        PT Long Term Goals - 04/09/17 1646      PT LONG TERM GOAL #1   Title  indepedent with HEP and how to progress himself    Time  8    Period  Weeks    Status  New    Target Date  06/04/17      PT LONG TERM GOAL #2   Title  able to sit with pressure on right posterior thigh with pain decreased >/= 80%    Time  8    Period  Weeks    Status  New    Target Date  06/04/17      PT LONG TERM GOAL #3   Title  able to contract right hamstring with minimal to no pain due to reduction of trigger points and strength 5/5    Time  8    Period  Weeks    Status  New    Target Date  06/04/17            Plan - 04/14/17 1403    Clinical Impression Statement  Pt presentes today with no complaints of hamstring pain. He was able to vigorously ride the recumbent bike and do prone ham curls with 3# ankle weights. Proximally PTA was able to identify multiple trigger points at the hamstring, but noted they are also present in the LT hamstring as well. Visably the Rt hamstring looked smaller than the LT.     Rehab Potential  Excellent    Clinical Impairments Affecting Rehab  Potential  None    PT Frequency  1x / week    PT Duration  8 weeks    PT Treatment/Interventions  Cryotherapy;Electrical Stimulation;Iontophoresis 4mg /ml Dexamethasone;Moist Heat;Ultrasound;Therapeutic exercise;Therapeutic activities;Neuromuscular re-education;Patient/family education;Manual techniques;Dry needling;Taping    PT Next Visit Plan  Bike, manual work to hamstring, flexibility/stretching and increase wt for strength, consider leg press.    PT Home Exercise Plan  progress as needed    Consulted and Agree with Plan of Care  Patient       Patient will benefit from skilled therapeutic intervention in order to improve the following deficits and impairments:  Increased fascial restricitons, Pain, Increased muscle spasms, Decreased strength, Decreased activity tolerance  Visit Diagnosis: Pain in right thigh  Muscle weakness (generalized)  Cramp and spasm     Problem List Patient Active Problem List   Diagnosis Date Noted  . Chronic diastolic heart failure ([place]) 03/21/2016  . Murmur, cardiac 01/15/2016  . Dyspnea 01/15/2016  . Abnormal ECG 01/15/2016  . Hypertriglyceridemia 01/15/2016    COCHRAN,JENNIFER, PTA 04/14/2017, 3:51 PM  [facility] [address], [place], [place], [address] Phone: [phone]   Fax:  [phone]  Name: Marc Thornton MRN: [id number] Date of Birth: 1945-09-07

## 2017-04-23 ENCOUNTER — Ambulatory Visit: Payer: Medicare PPO | Admitting: Physical Therapy

## 2017-04-23 ENCOUNTER — Encounter: Payer: Self-pay | Admitting: Physical Therapy

## 2017-04-23 DIAGNOSIS — R252 Cramp and spasm: Secondary | ICD-10-CM

## 2017-04-23 DIAGNOSIS — M79651 Pain in right thigh: Secondary | ICD-10-CM

## 2017-04-23 DIAGNOSIS — M6281 Muscle weakness (generalized): Secondary | ICD-10-CM

## 2017-04-23 NOTE — Therapy (Signed)
[facility] [address], [place], [place], [address] Phone: [phone]   Fax:  [phone]  Physical Therapy Treatment  Patient Details  Name: Marc Thornton MRN: [id number] Date of Birth: 1945-04-25 Referring Provider: Dr. [name]   Encounter Date: 04/23/2017  PT End of Session - 04/23/17 1415    Visit Number  3    Date for PT Re-Evaluation  06/04/17    Authorization Type  [employer]    PT Start Time  1412 Pt late    PT Stop Time  1510    PT Time Calculation (min)  58 min    Activity Tolerance  Patient tolerated treatment well    Behavior During Therapy  [facility] for tasks assessed/performed       Past Medical History:  Diagnosis Date  . Arthritis   . BPH (benign prostatic hyperplasia)   . Chicken pox   . DDD (degenerative disc disease), cervical   . Depression   . Elevated PSA   . Fracture of foot bone, right, closed 1964  . GERD (gastroesophageal reflux disease)   . Heart murmur   . Hyperlipidemia   . Polyneuropathy   . Rheumatic fever   . Seasonal allergies     Past Surgical History:  Procedure Laterality Date  . WISDOM TOOTH EXTRACTION      There were no vitals filed for this visit.  Subjective Assessment - 04/23/17 1415    Subjective  I am pleased with how my leg has felt since coming to therapy.     Currently in Pain?  No/denies    Multiple Pain Sites  No                      [facility] Adult PT Treatment/Exercise - 04/23/17 0001      Knee/Hip Exercises: Stretches   Active Hamstring Stretch  Right;3 reps;30 seconds      Knee/Hip Exercises: Aerobic   Recumbent Bike  L6 x 10  min PTA present for status       Knee/Hip Exercises: Machines for Strengthening   Cybex Knee Flexion  30# 70% RT 30% LT 2x10 VC for slower speed and control, pt like to go fast    Cybex Leg Press  Seat 8 RTEL 50# 20x, 60# 20x VC to decelerate slowly      Moist Heat Therapy   Number Minutes Moist Heat   15 Minutes    Moist Heat Location  Other (comment) posterior right thigh      Manual Therapy   Manual Therapy  Soft tissue mobilization    Soft tissue mobilization  RT hamstring proximal & distal               PT Short Term Goals - 04/23/17 1416      PT SHORT TERM GOAL #1   Title  able to sit with pressure on posterior right thigh with pain decreased >/= 25%    Time  4    Period  Weeks    Status  Achieved 50%      PT SHORT TERM GOAL #2   Title  able to activate right hamstring with pain decreased >/= 25%    Time  4    Period  Weeks    Status  Achieved      PT SHORT TERM GOAL #3   Title  independent with initial HEP    Time  4    Status  Achieved  PT Long Term Goals - 04/09/17 1646      PT LONG TERM GOAL #1   Title  indepedent with HEP and how to progress himself    Time  8    Period  Weeks    Status  New    Target Date  06/04/17      PT LONG TERM GOAL #2   Title  able to sit with pressure on right posterior thigh with pain decreased >/= 80%    Time  8    Period  Weeks    Status  New    Target Date  06/04/17      PT LONG TERM GOAL #3   Title  able to contract right hamstring with minimal to no pain due to reduction of trigger points and strength 5/5    Time  8    Period  Weeks    Status  New    Target Date  06/04/17            Plan - 04/23/17 1417    Clinical Impression Statement  Short term goals met as of today, pt at least 50% improved especially over the last few days. After riding the recumbent bike pt requested taking his blood pressure which was 145/70 mmhg. Pt had no pain with resistive hamstring exercises for strength today. There appears to be a smaller fibrotic area distal and medial hamstring during manual work today. Pt was encouraged to stretch at home daily.      Rehab Potential  Excellent    Clinical Impairments Affecting Rehab Potential  None    PT Frequency  1x / week    PT Duration  8 weeks    PT  Treatment/Interventions  Cryotherapy;Electrical Stimulation;Iontophoresis '4mg'$ /ml Dexamethasone;Moist Heat;Ultrasound;Therapeutic exercise;Therapeutic activities;Neuromuscular re-education;Patient/family education;Manual techniques;Dry needling;Taping    PT Next Visit Plan  Bike, manual work to hamstring, flexibility/stretching and increase wt for strength.    Consulted and Agree with Plan of Care  --       Patient will benefit from skilled therapeutic intervention in order to improve the following deficits and impairments:  Increased fascial restricitons, Pain, Increased muscle spasms, Decreased strength, Decreased activity tolerance  Visit Diagnosis: Pain in right thigh  Muscle weakness (generalized)  Cramp and spasm     Problem List Patient Active Problem List   Diagnosis Date Noted  . Chronic diastolic heart failure ([place]) 03/21/2016  . Murmur, cardiac 01/15/2016  . Dyspnea 01/15/2016  . Abnormal ECG 01/15/2016  . Hypertriglyceridemia 01/15/2016    COCHRAN,JENNIFER, PTA 04/23/2017, 3:35 PM  [facility] [address] [place], [place], [address] Phone: [phone]   Fax:  [phone]  Name: Marc Thornton MRN: [id number] Date of Birth: [birth year]

## 2017-04-30 ENCOUNTER — Ambulatory Visit: Payer: Medicare PPO | Admitting: Physical Therapy

## 2017-04-30 ENCOUNTER — Encounter: Payer: Self-pay | Admitting: Physical Therapy

## 2017-04-30 DIAGNOSIS — M79651 Pain in right thigh: Secondary | ICD-10-CM

## 2017-04-30 DIAGNOSIS — R252 Cramp and spasm: Secondary | ICD-10-CM

## 2017-04-30 DIAGNOSIS — M6281 Muscle weakness (generalized): Secondary | ICD-10-CM

## 2017-04-30 NOTE — Therapy (Signed)
[facility] [address] [place], [place], [address] Phone: [phone]   Fax:  [phone]  Physical Therapy Treatment  Patient Details  Name: LOGYN KENDRICK MRN: [id number] Date of Birth: 03/24/1946 Referring Provider: Dr. [name]   Encounter Date: 04/30/2017  PT End of Session - 04/30/17 1405    Visit Number  4    Date for PT Re-Evaluation  06/04/17    Authorization Type  [employer]    PT Start Time  1448    PT Stop Time  1500    PT Time Calculation (min)  57 min    Activity Tolerance  Patient tolerated treatment well    Behavior During Therapy  [facility] for tasks assessed/performed       Past Medical History:  Diagnosis Date  . Arthritis   . BPH (benign prostatic hyperplasia)   . Chicken pox   . DDD (degenerative disc disease), cervical   . Depression   . Elevated PSA   . Fracture of foot bone, right, closed 1964  . GERD (gastroesophageal reflux disease)   . Heart murmur   . Hyperlipidemia   . Polyneuropathy   . Rheumatic fever   . Seasonal allergies     Past Surgical History:  Procedure Laterality Date  . WISDOM TOOTH EXTRACTION      There were no vitals filed for this visit.  Subjective Assessment - 04/30/17 1407    Subjective  I only have hamstring pain when I stretch it. I get more discomfort from my lumbar (low.)    Currently in Pain?  No/denies    Aggravating Factors   When stretching it    Pain Relieving Factors  Exercises    Multiple Pain Sites  No                      [facility] Adult PT Treatment/Exercise - 04/30/17 0001      Knee/Hip Exercises: Stretches   Active Hamstring Stretch  Right;3 reps;30 seconds      Knee/Hip Exercises: Aerobic   Recumbent Bike  L6 x 10  min PTA present for status       Knee/Hip Exercises: Machines for Strengthening   Cybex Knee Flexion  30# RTLE > LTLE 3x10    Cybex Leg Press  Seat 8 RTLE 65# 30x       Electrical Stimulation   Electrical Stimulation Location  posterior right thigh    Electrical Stimulation Action  IFC    Electrical Stimulation Goals  Pain      Manual Therapy   Manual Therapy  Soft tissue mobilization    Soft tissue mobilization  RT hamstring proximal & distal               PT Short Term Goals - 04/23/17 1416      PT SHORT TERM GOAL #1   Title  able to sit with pressure on posterior right thigh with pain decreased >/= 25%    Time  4    Period  Weeks    Status  Achieved 50%      PT SHORT TERM GOAL #2   Title  able to activate right hamstring with pain decreased >/= 25%    Time  4    Period  Weeks    Status  Achieved      PT SHORT TERM GOAL #3   Title  independent with initial HEP    Time  4  Status  Achieved        PT Long Term Goals - 04/30/17 1410      PT LONG TERM GOAL #1   Title  indepedent with HEP and how to progress himself    Time  8    Period  Weeks    Status  On-going      PT LONG TERM GOAL #2   Title  able to sit with pressure on right posterior thigh with pain decreased >/= 80%    Time  8    Period  Weeks    Status  Achieved      PT LONG TERM GOAL #3   Title  able to contract right hamstring with minimal to no pain due to reduction of trigger points and strength 5/5    Time  8    Period  Weeks    Status  On-going Will MMT nest session, but essentially no pain.            Plan - 04/30/17 1405    Clinical Impression Statement  Pt reports at this point he feels 75%-80 improved since the eval. He appears complinat with his HEP and reports riding a recumbent bike at home some. Although proximal trigger points can manually be palpated, they do not seem to produce any pain unless provoked manually.     Rehab Potential  Excellent    Clinical Impairments Affecting Rehab Potential  None    PT Frequency  1x / week    PT Duration  8 weeks    PT Treatment/Interventions  Cryotherapy;Electrical Stimulation;Iontophoresis 4mg /ml Dexamethasone;Moist  Heat;Ultrasound;Therapeutic exercise;Therapeutic activities;Neuromuscular re-education;Patient/family education;Manual techniques;Dry needling;Taping    PT Next Visit Plan  Dc next session.    Consulted and Agree with Plan of Care  Patient       Patient will benefit from skilled therapeutic intervention in order to improve the following deficits and impairments:  Increased fascial restricitons, Pain, Increased muscle spasms, Decreased strength, Decreased activity tolerance  Visit Diagnosis: Pain in right thigh  Muscle weakness (generalized)  Cramp and spasm     Problem List Patient Active Problem List   Diagnosis Date Noted  . Chronic diastolic heart failure ([place]) 03/21/2016  . Murmur, cardiac 01/15/2016  . Dyspnea 01/15/2016  . Abnormal ECG 01/15/2016  . Hypertriglyceridemia 01/15/2016    COCHRAN,JENNIFER, PTA 04/30/2017, 3:34 PM  [facility] [address] [place], [place], [address] Phone: [phone]   Fax:  [phone]  Name: JONATHIN HEINICKE MRN: [id number] Date of Birth: 1945/12/03

## 2017-05-07 ENCOUNTER — Telehealth: Payer: Self-pay | Admitting: Adult Health

## 2017-05-07 ENCOUNTER — Encounter: Payer: Self-pay | Admitting: Physical Therapy

## 2017-05-07 ENCOUNTER — Ambulatory Visit: Payer: Medicare PPO | Admitting: Physical Therapy

## 2017-05-07 DIAGNOSIS — M79651 Pain in right thigh: Secondary | ICD-10-CM

## 2017-05-07 DIAGNOSIS — R252 Cramp and spasm: Secondary | ICD-10-CM

## 2017-05-07 DIAGNOSIS — M545 Low back pain: Secondary | ICD-10-CM

## 2017-05-07 DIAGNOSIS — M6281 Muscle weakness (generalized): Secondary | ICD-10-CM

## 2017-05-07 NOTE — Telephone Encounter (Signed)
Patient is requesting Physical Therapy along with treatment he has already had when patient pulled his leg when he fell previously/same injury.  Patient is requesting a call back.

## 2017-05-07 NOTE — Therapy (Addendum)
[facility] [address] [place], [place], [address] Phone: [phone]   Fax:  [phone]  Physical Therapy Treatment  Patient Details  Name: Marc Thornton MRN: [id number] Date of Birth: 01-25-1946 Referring Provider: Dr. [name]   Encounter Date: 05/07/2017  PT End of Session - 05/07/17 1404    Visit Number  5    Date for PT Re-Evaluation  06/04/17    Authorization Type  [employer]    PT Start Time  1404    PT Stop Time  1505    PT Time Calculation (min)  61 min    Activity Tolerance  Patient tolerated treatment well    Behavior During Therapy  [facility] for tasks assessed/performed       Past Medical History:  Diagnosis Date  . Arthritis   . BPH (benign prostatic hyperplasia)   . Chicken pox   . DDD (degenerative disc disease), cervical   . Depression   . Elevated PSA   . Fracture of foot bone, right, closed 1964  . GERD (gastroesophageal reflux disease)   . Heart murmur   . Hyperlipidemia   . Polyneuropathy   . Rheumatic fever   . Seasonal allergies     Past Surgical History:  Procedure Laterality Date  . WISDOM TOOTH EXTRACTION      There were no vitals filed for this visit.  Subjective Assessment - 05/07/17 1408    Subjective  I don't really have hamstring pain anymore, I have more Rt low back to proximal glute pain fairly constantly but is positional. Range of pain is 3-7.    Currently in Pain?  Yes    Pain Score  3     Pain Location  Back    Pain Orientation  Right    Pain Descriptors / Indicators  Sore    Aggravating Factors   Standing up straight, and maybe the leg press or knee flexion machine    Multiple Pain Sites  No         [facility] PT Assessment - 05/07/17 0001      Observation/Other Assessments   Focus on Therapeutic Outcomes (FOTO)   29% limitation CJ      Strength   Right Knee Flexion  4/5 no pain    Right Knee Extension  5/5                  [facility] Adult PT  Treatment/Exercise - 05/07/17 0001      Knee/Hip Exercises: Stretches   Active Hamstring Stretch  Right;3 reps;30 seconds      Knee/Hip Exercises: Aerobic   Recumbent Bike  L6 x 10  min PTA present for status and FOTO      Knee/Hip Exercises: Machines for Strengthening   Cybex Knee Flexion  20# RTLE 3x10     Cybex Leg Press  Seat 8 RTLE 45# 3x 10  Reduced weight so see if that helped low back symptoms      Electrical Stimulation   Electrical Stimulation Location  posterior right thigh    Electrical Stimulation Action  IFC    Electrical Stimulation Parameters  80-150 HZ prone    Electrical Stimulation Goals  Pain               PT Short Term Goals - 04/23/17 1416      PT SHORT TERM GOAL #1   Title  able to sit with pressure on posterior right thigh with pain decreased >/=  25%    Time  4    Period  Weeks    Status  Achieved 50%      PT SHORT TERM GOAL #2   Title  able to activate right hamstring with pain decreased >/= 25%    Time  4    Period  Weeks    Status  Achieved      PT SHORT TERM GOAL #3   Title  independent with initial HEP    Time  4    Status  Achieved        PT Long Term Goals - 05/07/17 1411      PT LONG TERM GOAL #1   Title  indepedent with HEP and how to progress himself    Time  8    Period  Weeks    Status  Achieved      PT LONG TERM GOAL #3   Title  able to contract right hamstring with minimal to no pain due to reduction of trigger points and strength 5/5    Time  8    Period  Weeks    Status  Partially Met MMT was 4/5 but no pain            Plan - 05/07/17 1411    Clinical Impression Statement  Pt scored 29% on his FOTO today. He reports his hamstring pain/symptoms are currently 70%-75% improved. Major complaint at this time is pain that originates in his RT low back and radiates to the glutes. He plans to see MD/NP regarding this complaint and may request more PT to address this pain.  His MMT of the RT hamstring was slightly  better in strength but there was no pain like there was originally.     Rehab Potential  Excellent    Clinical Impairments Affecting Rehab Potential  None    PT Frequency  1x / week    PT Duration  8 weeks    PT Treatment/Interventions  Cryotherapy;Electrical Stimulation;Iontophoresis '4mg'$ /ml Dexamethasone;Moist Heat;Ultrasound;Therapeutic exercise;Therapeutic activities;Neuromuscular re-education;Patient/family education;Manual techniques;Dry needling;Taping    PT Next Visit Plan  Discharge this current plan of care, pt will see the MD/NP for his new complaints and perhaps return to PT to address this complaint.     Consulted and Agree with Plan of Care  Patient       Patient will benefit from skilled therapeutic intervention in order to improve the following deficits and impairments:  Increased fascial restricitons, Pain, Increased muscle spasms, Decreased strength, Decreased activity tolerance  Visit Diagnosis: Pain in right thigh  Muscle weakness (generalized)  Cramp and spasm     Problem List Patient Active Problem List   Diagnosis Date Noted  . Chronic diastolic heart failure ([place]) 03/21/2016  . Murmur, cardiac 01/15/2016  . Dyspnea 01/15/2016  . Abnormal ECG 01/15/2016  . Hypertriglyceridemia 01/15/2016    Myrene Galas, PTA 05/07/17 3:16 PM  [facility] [address] [place], [place], [address] Phone: [phone]   Fax:  [phone]  Name: Marc Thornton MRN: [id number] Date of Birth: March 27, 1946  PHYSICAL THERAPY DISCHARGE SUMMARY  Visits from Start of Care: 5  Current functional level related to goals / functional outcomes: See above.    Remaining deficits: See above.  Patient has new complaints that need to be addressed by the physician.  Education / Equipment: HEP Plan: Patient agrees to discharge.  Patient goals were met. Patient is being discharged due to meeting the stated rehab  goals.   Thank you for the referral. Earlie Counts, PT 05/08/17 7:54 AM  ?????

## 2017-05-07 NOTE — Telephone Encounter (Signed)
I am fine with him seeing PT for right lower back pain. He may want to check with insurance to make sure they will cover this since he JUST finished with PT for his hamstring

## 2017-05-09 NOTE — Telephone Encounter (Signed)
Pt notified of instructions and verbalized understanding. He is still seeing PT for hamstring and is "not worried about my insurance right now." He requested we go ahead and refer for R LBP. Referral placed as directed.

## 2017-05-13 ENCOUNTER — Encounter: Payer: Self-pay | Admitting: Physical Therapy

## 2017-05-13 ENCOUNTER — Other Ambulatory Visit: Payer: Self-pay

## 2017-05-13 ENCOUNTER — Ambulatory Visit: Payer: Medicare PPO | Attending: Adult Health | Admitting: Physical Therapy

## 2017-05-13 DIAGNOSIS — R252 Cramp and spasm: Secondary | ICD-10-CM | POA: Insufficient documentation

## 2017-05-13 DIAGNOSIS — M6281 Muscle weakness (generalized): Secondary | ICD-10-CM | POA: Diagnosis present

## 2017-05-13 DIAGNOSIS — M25551 Pain in right hip: Secondary | ICD-10-CM | POA: Diagnosis present

## 2017-05-13 DIAGNOSIS — M545 Low back pain: Secondary | ICD-10-CM

## 2017-05-13 DIAGNOSIS — M79651 Pain in right thigh: Secondary | ICD-10-CM | POA: Diagnosis present

## 2017-05-13 DIAGNOSIS — M62830 Muscle spasm of back: Secondary | ICD-10-CM

## 2017-05-13 NOTE — Therapy (Signed)
[facility] [address], [place], [place], [address] Phone: [phone]   Fax:  [phone]  Physical Therapy Evaluation  Patient Details  Name: Marc Thornton MRN: [id number] Date of Birth: 02/08/1946 Referring Provider: Beaulah Dinning, MD   Encounter Date: 05/13/2017  PT End of Session - 05/13/17 1616    Visit Number  1    Date for PT Re-Evaluation  07/08/17    Authorization Type  [employer]    Authorization Time Period  05/13/17 to 07/08/17    PT Start Time  1532    PT Stop Time  1625    PT Time Calculation (min)  53 min    Activity Tolerance  Patient tolerated treatment well;No increased pain    Behavior During Therapy  WFL for tasks assessed/performed       Past Medical History:  Diagnosis Date  . Arthritis   . BPH (benign prostatic hyperplasia)   . Chicken pox   . DDD (degenerative disc disease), cervical   . Depression   . Elevated PSA   . Fracture of foot bone, right, closed 1964  . GERD (gastroesophageal reflux disease)   . Heart murmur   . Hyperlipidemia   . Polyneuropathy   . Rheumatic fever   . Seasonal allergies     Past Surgical History:  Procedure Laterality Date  . WISDOM TOOTH EXTRACTION      There were no vitals filed for this visit.   Subjective Assessment - 05/13/17 1535    Subjective  Pt reports that he slipped on some water into [employer] and had some Rt low back and hamstring pain. His hamstring is atleast 80% improved, however his Rt buttock and low back region remain irritated with daily movements.     Currently in Pain?  No/denies    Aggravating Factors   standing up straight, leg press, going to sit    Pain Relieving Factors  repositioning and avoiding irritating movements     Effect of Pain on Daily Activities  issues with lifting his wife         [facility] PT Assessment - 05/13/17 0001      Assessment   Medical Diagnosis  Rt low back pain with sciatic presence      Referring Provider  Beaulah Dinning, MD    Prior Therapy  finished OPPT for hamstring on 05/07/17      Precautions   Precautions  None      Restrictions   Weight Bearing Restrictions  No      Balance Screen   Has the patient fallen in the past 6 months  Yes    How many times?  1 slipped on water at [employer]    Has the patient had a decrease in activity level because of a fear of falling?   No    Is the patient reluctant to leave their home because of a fear of falling?   No      Prior Function   Level of Independence  Independent    Vocation  Retired    Leisure  household chores, Primary care of wife      Cognition   Overall Cognitive Status  Within Functional Limits for tasks assessed      Observation/Other Assessments   Focus on Therapeutic Outcomes (FOTO)   33% limited       AROM   AROM Assessment Site  Lumbar    Lumbar Flexion  50% limited  mild  discomfort Rt low back     Lumbar Extension  75% limited       Strength   Strength Assessment Site  Hip;Ankle;Knee    Right/Left Hip  Right;Left    Right Hip Flexion  5/5    Right Hip Extension  5/5 (+) pain    Right Hip ABduction  5/5    Left Hip Flexion  5/5    Left Hip Extension  5/5    Left Hip ABduction  5/5    Right/Left Knee  Right;Left    Right Knee Flexion  5/5    Right Knee Extension  5/5    Left Knee Flexion  5/5    Left Knee Extension  5/5      Flexibility   Soft Tissue Assessment /Muscle Length  yes    Quadriceps  90 deg BLE, in prone      Palpation   Spinal mobility  hypomobile throughout lumbar spine     SI assessment   (-) sacral compression, (+) pain with end range Rt hip flexion    Palpation comment  tenderness along Rt gluteals/Rt paraspinals      Special Tests   Other special tests  Negative passive straight leg raise      Transfers   Comments  Use of UE due to pain Rt gluteal/low back region             Objective measurements completed on examination: See above findings.       [facility] Adult PT Treatment/Exercise - 05/13/17 0001      Moist Heat Therapy   Number Minutes Moist Heat  15 Minutes    Moist Heat Location  Hip;Other (comment) Rt buttock/low back       Electrical Stimulation   Electrical Stimulation Location  Rt buttock and low back     Electrical Stimulation Action  IFC    Electrical Stimulation Parameters  80-150 Hz prone     Electrical Stimulation Goals  Pain             PT Education - 05/13/17 1616    Education provided  Yes    Education Details  eval findings/POC    Person(s) Educated  Patient    Methods  Explanation    Comprehension  Verbalized understanding       PT Short Term Goals - 05/13/17 1626      PT SHORT TERM GOAL #1   Title  Pt will be independent with his initial HEP to decrease pain and improved flexibility.    Time  3    Period  Weeks    Status  New    Target Date  06/03/17      PT SHORT TERM GOAL #2   Title  Pt will active Rt gluteals into extension with 25% or more less pain to assist with sit to stand.    Time  3    Period  Weeks    Status  New        PT Long Term Goals - 05/13/17 1633      PT LONG TERM GOAL #1   Title  Pt will be indepedent with HEP and demonstrate good understanding of self progressions of exericse for completion at discharge.    Time  8    Period  Weeks    Status  New    Target Date  07/08/17      PT LONG TERM GOAL #2   Title  Pt will be able to  complete sit to stand with decreased pain atleast 75% from the start of therapy.    Time  8    Period  Weeks    Status  New      PT LONG TERM GOAL #3   Title  Pt will be able to properly lift and transfer his wife x2 trials without the need for additional cuing or support and without increase in Rt buttock/back pain    Time  8    Period  Weeks    Status  New MMT was 4/5 but no pain      PT LONG TERM GOAL #4   Title  Pt will demo improved quadriceps flexibility evident by his ability to reach atleast 100 deg of knee flexion  during prone testing, which will improve his standing mechanics and activation of the gluteals.     Time  8    Period  Weeks    Status  New             Plan - 05/13/17 1618    Clinical Impression Statement  Pt returns today with new physician order regarding Rt low back/buttock pain that has been present since his fall back in November. Pt recently finished treatment of the Rt hamstring which is nearly 80% resolved per his report today. He continues to have muscle spasm and stiffness along the Rt low back/buttock region which is impacting his daily activity and caring for his spouse. Pt demonstrates full BLE strength and negative straight leg raise during today's evaluation. He does have limitations in flexibility/mobility of the hips and lumbar spine along with tenderness to palpation along the Rt gluteals and Rt lumbar paraspinals. Pt responded well to PT treatment in the past and would benefit from skilled PT to address limitations in flexibility, mobility and decrease pain to allow him to safely perform transfers with his wife and maintain his regular duties as a primary caregiver.     History and Personal Factors relevant to plan of care:  none applicable to rehab POC    Clinical Presentation  Stable    Clinical Presentation due to:  mostly stable since onset     Clinical Decision Making  Low    Rehab Potential  Excellent    Clinical Impairments Affecting Rehab Potential  None    PT Frequency  1x / week    PT Duration  8 weeks    PT Treatment/Interventions  Cryotherapy;Electrical Stimulation;Iontophoresis 4mg /ml Dexamethasone;Moist Heat;Ultrasound;Therapeutic exercise;Therapeutic activities;Neuromuscular re-education;Patient/family education;Manual techniques;Dry needling;Taping;ADLs/Self Care Home Management;Traction    PT Next Visit Plan  follow up on lifting mechanics and make adjustments if needed; gentle lumbar/hip mobility work and possible DN to Rt lumbar/glute region; pt enjoys  heat/estim end of session    PT Home Exercise Plan  provide next session: lumbar/hip stretches    Consulted and Agree with Plan of Care  Patient       Patient will benefit from skilled therapeutic intervention in order to improve the following deficits and impairments:  Increased fascial restricitons, Pain, Increased muscle spasms, Decreased activity tolerance, Decreased range of motion, Hypomobility, Impaired flexibility, Postural dysfunction, Improper body mechanics  Visit Diagnosis: Right low back pain, unspecified chronicity, with sciatica presence unspecified  Pain in right hip  Muscle spasm of back     Problem List Patient Active Problem List   Diagnosis Date Noted  . Chronic diastolic heart failure ([place]) 03/21/2016  . Murmur, cardiac 01/15/2016  . Dyspnea 01/15/2016  . Abnormal ECG 01/15/2016  .  Hypertriglyceridemia 01/15/2016    4:44 PM,05/13/17 Sherol Dade PT, DPT [place] [facility] [place] [facility] [address], [place], [place], [address] Phone: [phone]   Fax:  [phone]  Name: Marc Thornton MRN: [id number] Date of Birth: 08/27/1945

## 2017-05-14 ENCOUNTER — Telehealth: Payer: Self-pay | Admitting: Physical Therapy

## 2017-05-14 ENCOUNTER — Ambulatory Visit: Payer: Medicare PPO | Admitting: Physical Therapy

## 2017-05-14 NOTE — Telephone Encounter (Signed)
No show. Pt states he was unaware he had an appointment today. He confirmed his next appointment on 05/21/17 at 2:45pm.   3:09 PM,05/14/17 Sherol Dade PT, [place] [facility] [place]

## 2017-05-21 ENCOUNTER — Ambulatory Visit: Payer: Medicare PPO | Admitting: Physical Therapy

## 2017-05-21 ENCOUNTER — Encounter: Payer: Self-pay | Admitting: Physical Therapy

## 2017-05-21 DIAGNOSIS — M545 Low back pain: Secondary | ICD-10-CM | POA: Diagnosis not present

## 2017-05-21 DIAGNOSIS — M25551 Pain in right hip: Secondary | ICD-10-CM

## 2017-05-21 DIAGNOSIS — M62830 Muscle spasm of back: Secondary | ICD-10-CM

## 2017-05-21 NOTE — Therapy (Signed)
[facility] [address], [place], [place], [address] Phone: [phone]   Fax:  [phone]  Physical Therapy Treatment  Patient Details  Name: Marc Thornton MRN: [id number] Date of Birth: 07/09/1945 Referring Provider: Beaulah Dinning, MD   Encounter Date: 05/21/2017  PT End of Session - 05/21/17 1507    Visit Number  2    Date for PT Re-Evaluation  07/08/17    Authorization Type  [employer]    Authorization Time Period  05/13/17 to 07/08/17    PT Start Time  1455 Pt arrived late     PT Stop Time  1550    PT Time Calculation (min)  55 min    Activity Tolerance  Patient tolerated treatment well;No increased pain    Behavior During Therapy  WFL for tasks assessed/performed       Past Medical History:  Diagnosis Date  . Arthritis   . BPH (benign prostatic hyperplasia)   . Chicken pox   . DDD (degenerative disc disease), cervical   . Depression   . Elevated PSA   . Fracture of foot bone, right, closed 1964  . GERD (gastroesophageal reflux disease)   . Heart murmur   . Hyperlipidemia   . Polyneuropathy   . Rheumatic fever   . Seasonal allergies     Past Surgical History:  Procedure Laterality Date  . WISDOM TOOTH EXTRACTION      There were no vitals filed for this visit.  Subjective Assessment - 05/21/17 1456    Subjective  Pt reports that he is doing well, but he has had alot going on with caring for his wife. He     Currently in Pain?  No/denies                [facility] Adult PT Treatment/Exercise - 05/21/17 0001      Knee/Hip Exercises: Stretches   Other Knee/Hip Stretches  SKTC 5x20 sec each; Lt/Rt glute stretch 3x30 sec     Other Knee/Hip Stretches  DKTC 5x20 sec       Knee/Hip Exercises: Aerobic   Recumbent Bike  L3 x5 min PT present to discuss session      Knee/Hip Exercises: Supine   Other Supine Knee/Hip Exercises  low trunk rotation in hooklying x20 reps each direction     Other Supine  Knee/Hip Exercises  hip extension isometric into mat table 10x5 sec hold each LE      Moist Heat Therapy   Number Minutes Moist Heat  15 Minutes    Moist Heat Location  -- Rt buttock/low back      Electrical Stimulation   Electrical Stimulation Location  Rt buttock and low back     Electrical Stimulation Action  IFC    Electrical Stimulation Parameters  80-150 Hz prone     Electrical Stimulation Goals  Pain             PT Education - 05/21/17 1527    Education provided  Yes    Education Details  HEP     Person(s) Educated  Patient    Methods  Explanation;Verbal cues;Handout    Comprehension  Returned demonstration;Verbalized understanding       PT Short Term Goals - 05/13/17 1626      PT SHORT TERM GOAL #1   Title  Pt will be independent with his initial HEP to decrease pain and improved flexibility.    Time  3    Period  Weeks  Status  New    Target Date  06/03/17      PT SHORT TERM GOAL #2   Title  Pt will active Rt gluteals into extension with 25% or more less pain to assist with sit to stand.    Time  3    Period  Weeks    Status  New        PT Long Term Goals - 05/13/17 1633      PT LONG TERM GOAL #1   Title  Pt will be indepedent with HEP and demonstrate good understanding of self progressions of exericse for completion at discharge.    Time  8    Period  Weeks    Status  New    Target Date  07/08/17      PT LONG TERM GOAL #2   Title  Pt will be able to complete sit to stand with decreased pain atleast 75% from the start of therapy.    Time  8    Period  Weeks    Status  New      PT LONG TERM GOAL #3   Title  Pt will be able to properly lift and transfer his wife x2 trials without the need for additional cuing or support and without increase in Rt buttock/back pain    Time  8    Period  Weeks    Status  New MMT was 4/5 but no pain      PT LONG TERM GOAL #4   Title  Pt will demo improved quadriceps flexibility evident by his ability to reach  atleast 100 deg of knee flexion during prone testing, which will improve his standing mechanics and activation of the gluteals.     Time  8    Period  Weeks    Status  New            Plan - 05/21/17 1540    Clinical Impression Statement  Pt arrived late to his appointment today. Therapist was able to incorporate gentle lumbar/hip ROM exercises into the pt's HEP and he demonstrated good understanding of technique with intermittent verbal instruction. Introduced hip extension isometric which was pain free this session compared to his evaluation. Pt continues to report good results following heat and TENS, so we ended with this today. No reports of pain during of following today's session.      Rehab Potential  Excellent    Clinical Impairments Affecting Rehab Potential  None    PT Frequency  1x / week    PT Duration  8 weeks    PT Treatment/Interventions  Cryotherapy;Electrical Stimulation;Iontophoresis 4mg /ml Dexamethasone;Moist Heat;Ultrasound;Therapeutic exercise;Therapeutic activities;Neuromuscular re-education;Patient/family education;Manual techniques;Dry needling;Taping;ADLs/Self Care Home Management;Traction    PT Next Visit Plan  continue to progress gentle lumbar/hip mobility work; progress from hip isometrics various ranges of hip flexion to gentle hip strengthening through full range; possible DN to Rt lumbar/glute region; pt enjoys heat/estim end of session    PT Home Exercise Plan  SKTC, glute/piriformis stretch, low trunk rotation    Consulted and Agree with Plan of Care  Patient       Patient will benefit from skilled therapeutic intervention in order to improve the following deficits and impairments:  Increased fascial restricitons, Pain, Increased muscle spasms, Decreased activity tolerance, Decreased range of motion, Hypomobility, Impaired flexibility, Postural dysfunction, Improper body mechanics  Visit Diagnosis: Right low back pain, unspecified chronicity, with  sciatica presence unspecified  Pain in right hip  Muscle spasm  of back     Problem List Patient Active Problem List   Diagnosis Date Noted  . Chronic diastolic heart failure ([place]) 03/21/2016  . Murmur, cardiac 01/15/2016  . Dyspnea 01/15/2016  . Abnormal ECG 01/15/2016  . Hypertriglyceridemia 01/15/2016    3:54 PM,05/21/17 Sherol Dade PT, DPT [place] [facility] [place] [facility] [address], [place], [place], [address] Phone: [phone]   Fax:  [phone]  Name: Marc Thornton MRN: [id number] Date of Birth: 1945/08/14

## 2017-05-21 NOTE — Patient Instructions (Addendum)
  LOWER TRUNK ROTATIONS - LTR  Lying on your back with your knees bent, gently move your knees side-to-side.  x20 reps      SINGLE KNEE TO CHEST STRETCH - SKTC  While lying on your back, use your hands and gently draw up a knee towards your chest.   Keep your other knee straight and lying on the ground. Hold 10 sec, repeat 5x each.      Supine glute med/piriformis stretch  Lie on your back with both legs straight. Bring one knee up to your chest and grab with both hands as if giving it a hug. Pull that knee towards the opposite shoulder as tolerated feeling this in low back and butt region.   Hold 10 sec, repeat 5x each side    [facility] [address] [place], [place] [address] Phone # [phone] Fax [phone]

## 2017-05-26 ENCOUNTER — Encounter: Payer: Self-pay | Admitting: Physical Therapy

## 2017-05-26 ENCOUNTER — Ambulatory Visit: Payer: Medicare PPO | Admitting: Physical Therapy

## 2017-05-26 DIAGNOSIS — M545 Low back pain: Secondary | ICD-10-CM

## 2017-05-26 DIAGNOSIS — M62830 Muscle spasm of back: Secondary | ICD-10-CM

## 2017-05-26 DIAGNOSIS — M25551 Pain in right hip: Secondary | ICD-10-CM

## 2017-05-26 NOTE — Therapy (Signed)
[facility] [address], [place], [place], [address] Phone: [phone]   Fax:  [phone]  Physical Therapy Treatment  Patient Details  Name: Marc Thornton MRN: [id number] Date of Birth: February 14, 1946 Referring Provider: Beaulah Dinning, MD   Encounter Date: 05/26/2017  PT End of Session - 05/26/17 1505    Visit Number  3    Date for PT Re-Evaluation  07/08/17    Authorization Type  [employer]    Authorization Time Period  05/13/17 to 07/08/17    PT Start Time  1442    PT Stop Time  1540    PT Time Calculation (min)  58 min    Activity Tolerance  Patient tolerated treatment well;No increased pain    Behavior During Therapy  WFL for tasks assessed/performed       Past Medical History:  Diagnosis Date  . Arthritis   . BPH (benign prostatic hyperplasia)   . Chicken pox   . DDD (degenerative disc disease), cervical   . Depression   . Elevated PSA   . Fracture of foot bone, right, closed 1964  . GERD (gastroesophageal reflux disease)   . Heart murmur   . Hyperlipidemia   . Polyneuropathy   . Rheumatic fever   . Seasonal allergies     Past Surgical History:  Procedure Laterality Date  . WISDOM TOOTH EXTRACTION      There were no vitals filed for this visit.  Subjective Assessment - 05/26/17 1454    Subjective  Pt reports he is completing his HEP and thinks his pain is improving. He has no other complaints at this time.     Currently in Pain?  Yes    Pain Score  2     Pain Location  Back    Pain Orientation  Right;Lower    Pain Descriptors / Indicators  Sore    Pain Type  Acute pain    Pain Radiating Towards  none     Pain Onset  More than a month ago    Pain Frequency  Rarely    Aggravating Factors   alot of lifting    Pain Relieving Factors  stretching    Effect of Pain on Daily Activities  mild limitation                       [facility] Adult PT Treatment/Exercise - 05/26/17 0001      Knee/Hip Exercises: Aerobic   Nustep  L6 x6 min PT present to discuss HEP/session      Knee/Hip Exercises: Supine   Bridges  Both;15 reps;1 set    Other Supine Knee/Hip Exercises  B knees to chest with LE on red physioball x20 reps     Other Supine Knee/Hip Exercises  Rt hip extension isometric into mat table 15x3 sec hold (at full extension, at 90 deg and at 105 deg);  supine bent knee fallout on the Rt with 5 sec hold x10 reps       Moist Heat Therapy   Number Minutes Moist Heat  15 Minutes concurrent Estim    Moist Heat Location  Other (comment) Rt buttock/low back       Electrical Stimulation   Electrical Stimulation Location  Rt buttock and low back     Electrical Stimulation Action  IFC    Electrical Stimulation Parameters  80-150 Hz in prone     Electrical Stimulation Goals  Pain  PT Education - 05/26/17 1528    Education provided  Yes    Education Details  technique with therex     Person(s) Educated  Patient    Methods  Explanation;Verbal cues    Comprehension  Verbalized understanding;Returned demonstration       PT Short Term Goals - 05/13/17 1626      PT SHORT TERM GOAL #1   Title  Pt will be independent with his initial HEP to decrease pain and improved flexibility.    Time  3    Period  Weeks    Status  New    Target Date  06/03/17      PT SHORT TERM GOAL #2   Title  Pt will active Rt gluteals into extension with 25% or more less pain to assist with sit to stand.    Time  3    Period  Weeks    Status  New        PT Long Term Goals - 05/13/17 1633      PT LONG TERM GOAL #1   Title  Pt will be indepedent with HEP and demonstrate good understanding of self progressions of exericse for completion at discharge.    Time  8    Period  Weeks    Status  New    Target Date  07/08/17      PT LONG TERM GOAL #2   Title  Pt will be able to complete sit to stand with decreased pain atleast 75% from the start of therapy.    Time  8    Period   Weeks    Status  New      PT LONG TERM GOAL #3   Title  Pt will be able to properly lift and transfer his wife x2 trials without the need for additional cuing or support and without increase in Rt buttock/back pain    Time  8    Period  Weeks    Status  New MMT was 4/5 but no pain      PT LONG TERM GOAL #4   Title  Pt will demo improved quadriceps flexibility evident by his ability to reach atleast 100 deg of knee flexion during prone testing, which will improve his standing mechanics and activation of the gluteals.     Time  8    Period  Weeks    Status  New            Plan - 05/26/17 1529    Clinical Impression Statement  Pt continues to note improvement in Rt buttock and low back pain, noting no more than 2/10 dull ache upon arrival today. Session continued with flexibility exercise in addition to progressed isometric glute activation, which pt reports was relatively pain free. Pt does require occasional redirection to the specific task at hand, but overall continues to have full pain relief by the end of his session despite progressions in exercise. Will continue with current POC.     Rehab Potential  Excellent    Clinical Impairments Affecting Rehab Potential  None    PT Frequency  1x / week    PT Duration  8 weeks    PT Treatment/Interventions  Cryotherapy;Electrical Stimulation;Iontophoresis 4mg /ml Dexamethasone;Moist Heat;Ultrasound;Therapeutic exercise;Therapeutic activities;Neuromuscular re-education;Patient/family education;Manual techniques;Dry needling;Taping;ADLs/Self Care Home Management;Traction    PT Next Visit Plan  continue to progress gentle lumbar/hip mobility work; progress from hip isometrics various ranges of hip flexion to gentle hip strengthening through full range; possible  DN to Rt lumbar/glute region; pt enjoys heat/estim end of session    PT Home Exercise Plan  SKTC, glute/piriformis stretch, low trunk rotation    Consulted and Agree with Plan of Care   Patient       Patient will benefit from skilled therapeutic intervention in order to improve the following deficits and impairments:  Increased fascial restricitons, Pain, Increased muscle spasms, Decreased activity tolerance, Decreased range of motion, Hypomobility, Impaired flexibility, Postural dysfunction, Improper body mechanics  Visit Diagnosis: Right low back pain, unspecified chronicity, with sciatica presence unspecified  Pain in right hip  Muscle spasm of back     Problem List Patient Active Problem List   Diagnosis Date Noted  . Chronic diastolic heart failure ([place]) 03/21/2016  . Murmur, cardiac 01/15/2016  . Dyspnea 01/15/2016  . Abnormal ECG 01/15/2016  . Hypertriglyceridemia 01/15/2016    3:55 PM,05/26/17 Sherol Dade PT, DPT [place] [facility] [place]  [facility] [address] [place], [place], [address] Phone: [phone]   Fax:  [phone]  Name: Marc Thornton MRN: [id number] Date of Birth: Jun 25, 1945

## 2017-05-28 ENCOUNTER — Encounter: Payer: Medicare PPO | Admitting: Physical Therapy

## 2017-06-04 ENCOUNTER — Encounter: Payer: Self-pay | Admitting: Physical Therapy

## 2017-06-04 ENCOUNTER — Ambulatory Visit: Payer: Medicare PPO | Admitting: Physical Therapy

## 2017-06-04 DIAGNOSIS — R252 Cramp and spasm: Secondary | ICD-10-CM

## 2017-06-04 DIAGNOSIS — M545 Low back pain: Secondary | ICD-10-CM

## 2017-06-04 DIAGNOSIS — M6281 Muscle weakness (generalized): Secondary | ICD-10-CM

## 2017-06-04 DIAGNOSIS — M62830 Muscle spasm of back: Secondary | ICD-10-CM

## 2017-06-04 DIAGNOSIS — M79651 Pain in right thigh: Secondary | ICD-10-CM

## 2017-06-04 DIAGNOSIS — M25551 Pain in right hip: Secondary | ICD-10-CM

## 2017-06-04 NOTE — Therapy (Addendum)
[facility] [address], [place], [place], [address] Phone: [phone]   Fax:  [phone]  Physical Therapy Treatment  Patient Details  Name: Marc Thornton MRN: [id number] Date of Birth: 28-Jun-1945 Referring Provider: Beaulah Dinning, MD   Encounter Date: 06/04/2017  PT End of Session - 06/04/17 1430    Visit Number  4    Date for PT Re-Evaluation  07/08/17    Authorization Type  [employer]    Authorization Time Period  05/13/17 to 07/08/17    PT Start Time  1425    PT Stop Time  1535    PT Time Calculation (min)  70 min    Activity Tolerance  Patient tolerated treatment well;No increased pain    Behavior During Therapy  WFL for tasks assessed/performed       Past Medical History:  Diagnosis Date  . Arthritis   . BPH (benign prostatic hyperplasia)   . Chicken pox   . DDD (degenerative disc disease), cervical   . Depression   . Elevated PSA   . Fracture of foot bone, right, closed 1964  . GERD (gastroesophageal reflux disease)   . Heart murmur   . Hyperlipidemia   . Polyneuropathy   . Rheumatic fever   . Seasonal allergies     Past Surgical History:  Procedure Laterality Date  . WISDOM TOOTH EXTRACTION      There were no vitals filed for this visit.  Subjective Assessment - 06/04/17 1426    Subjective  My back is better. Pt was late due to isues with his wife.     Currently in Pain?  Yes    Pain Score  1     Pain Location  Back    Pain Orientation  Right    Pain Descriptors / Indicators  Dull    Aggravating Factors   not using good body mechanics    Pain Relieving Factors  stretching    Multiple Pain Sites  No                      [facility] Adult PT Treatment/Exercise - 06/04/17 0001      Knee/Hip Exercises: Stretches   Piriformis Stretch  -- Hip ER release bil, L1 foot on mat 30 sec 2x     Other Knee/Hip Stretches  SKTC 5x20 sec each; Lt/Rt glute stretch 3x30 sec     Other Knee/Hip  Stretches  Knee to opposite  shoulder  2x20 sec bil       Knee/Hip Exercises: Aerobic   Nustep  L6 x6 min seat #12, arm #6 PT present to discuss HEP/session      Knee/Hip Exercises: Supine   Bridges  Both;1 set;20 reps twinge while lowering hips    Other Supine Knee/Hip Exercises  B knees to chest with LE on red physioball x20 reps ; feet on red physioball and roll ball side to side to engage the abdominals.  bridge with feet on ball with control 10x    Other Supine Knee/Hip Exercises  Rt hip extension isometric into mat table 15x3 sec hold (at full extension, at 90 deg and at 105 deg); supine bent knee fallout on the Rt and left with 5 sec hold x10 reps       Moist Heat Therapy   Number Minutes Moist Heat  15 Minutes consurrent estim    Moist Heat Location  Other (comment) Rt buttock/low back  Acupuncturist Location  Rt buttock and low back     Electrical Stimulation Action  IFC    Electrical Stimulation Parameters  80-150 HZ in prone    Electrical Stimulation Goals  Pain               PT Short Term Goals - 06/04/17 1512      PT SHORT TERM GOAL #1   Title  Pt will be independent with his initial HEP to decrease pain and improved flexibility.    Time  3    Period  Weeks    Status  On-going      PT SHORT TERM GOAL #2   Title  Pt will active Rt gluteals into extension with 25% or more less pain to assist with sit to stand.    Time  3    Period  Weeks    Status  On-going      PT SHORT TERM GOAL #3   Title  independent with initial HEP    Time  4    Period  Weeks    Status  On-going        PT Long Term Goals - 05/13/17 1633      PT LONG TERM GOAL #1   Title  Pt will be indepedent with HEP and demonstrate good understanding of self progressions of exericse for completion at discharge.    Time  8    Period  Weeks    Status  New    Target Date  07/08/17      PT LONG TERM GOAL #2   Title  Pt will be able to complete sit to  stand with decreased pain atleast 75% from the start of therapy.    Time  8    Period  Weeks    Status  New      PT LONG TERM GOAL #3   Title  Pt will be able to properly lift and transfer his wife x2 trials without the need for additional cuing or support and without increase in Rt buttock/back pain    Time  8    Period  Weeks    Status  New MMT was 4/5 but no pain      PT LONG TERM GOAL #4   Title  Pt will demo improved quadriceps flexibility evident by his ability to reach atleast 100 deg of knee flexion during prone testing, which will improve his standing mechanics and activation of the gluteals.     Time  8    Period  Weeks    Status  New            Plan - 06/04/17 1430    Clinical Impression Statement  Patient needs verbal cues to go slowly with exercise to work on control.  Patient has pain in right buttocks when coming down during a bridge.  Patient was working on flexibility and core strength in therapy session.  Patient will benefit fromskilled therapy to reduce pain and improve function.     Rehab Potential  Excellent    Clinical Impairments Affecting Rehab Potential  None    PT Frequency  1x / week    PT Duration  8 weeks    PT Treatment/Interventions  Cryotherapy;Electrical Stimulation;Iontophoresis 4mg /ml Dexamethasone;Moist Heat;Ultrasound;Therapeutic exercise;Therapeutic activities;Neuromuscular re-education;Patient/family education;Manual techniques;Dry needling;Taping;ADLs/Self Care Home Management;Traction    PT Next Visit Plan  continue to progress gentle lumbar/hip mobility work; progress from hip isometrics various ranges of hip  flexion to gentle hip strengthening through full range; possible DN to Rt lumbar/glute region; pt enjoys heat/estim end of session    PT Home Exercise Plan  SKTC, glute/piriformis stretch, low trunk rotation    Recommended Other Services  MD signed initial note    Consulted and Agree with Plan of Care  Patient       Patient will  benefit from skilled therapeutic intervention in order to improve the following deficits and impairments:  Increased fascial restricitons, Pain, Increased muscle spasms, Decreased activity tolerance, Decreased range of motion, Hypomobility, Impaired flexibility, Postural dysfunction, Improper body mechanics  Visit Diagnosis: Right low back pain, unspecified chronicity, with sciatica presence unspecified  Pain in right hip  Muscle spasm of back  Pain in right thigh  Muscle weakness (generalized)  Cramp and spasm     Problem List Patient Active Problem List   Diagnosis Date Noted  . Chronic diastolic heart failure ([place]) 03/21/2016  . Murmur, cardiac 01/15/2016  . Dyspnea 01/15/2016  . Abnormal ECG 01/15/2016  . Hypertriglyceridemia 01/15/2016    Earlie Counts, PT 06/04/17 3:15 PM  Myrene Galas, PTA 06/04/17 3:24 PM  [facility] [address], [place], [place], [address] Phone: [phone]   Fax:  [phone]  Name: Marc Thornton MRN: [id number] Date of Birth: Feb 28, 1946

## 2017-06-11 ENCOUNTER — Ambulatory Visit: Payer: Medicare PPO | Attending: Adult Health | Admitting: Physical Therapy

## 2017-06-11 ENCOUNTER — Encounter: Payer: Self-pay | Admitting: Physical Therapy

## 2017-06-11 DIAGNOSIS — M25551 Pain in right hip: Secondary | ICD-10-CM | POA: Diagnosis present

## 2017-06-11 DIAGNOSIS — M62830 Muscle spasm of back: Secondary | ICD-10-CM | POA: Insufficient documentation

## 2017-06-11 DIAGNOSIS — M79651 Pain in right thigh: Secondary | ICD-10-CM | POA: Insufficient documentation

## 2017-06-11 DIAGNOSIS — R252 Cramp and spasm: Secondary | ICD-10-CM | POA: Diagnosis present

## 2017-06-11 DIAGNOSIS — M545 Low back pain: Secondary | ICD-10-CM | POA: Diagnosis present

## 2017-06-11 DIAGNOSIS — M6281 Muscle weakness (generalized): Secondary | ICD-10-CM | POA: Insufficient documentation

## 2017-06-11 NOTE — Therapy (Signed)
[facility] [address], [place], [place], [address] Phone: [phone]   Fax:  [phone]  Physical Therapy Treatment  Patient Details  Name: Marc Thornton MRN: [id number] Date of Birth: [birth year] Referring Provider: Beaulah Dinning, MD   Encounter Date: 06/11/2017  PT End of Session - 06/11/17 1405    Visit Number  5    Date for PT Re-Evaluation  07/08/17    Authorization Type  [employer]    Authorization Time Period  05/13/17 to 07/08/17    PT Start Time  6967    PT Stop Time  1503    PT Time Calculation (min)  60 min    Activity Tolerance  Patient tolerated treatment well;No increased pain    Behavior During Therapy  WFL for tasks assessed/performed       Past Medical History:  Diagnosis Date  . Arthritis   . BPH (benign prostatic hyperplasia)   . Chicken pox   . DDD (degenerative disc disease), cervical   . Depression   . Elevated PSA   . Fracture of foot bone, right, closed 1964  . GERD (gastroesophageal reflux disease)   . Heart murmur   . Hyperlipidemia   . Polyneuropathy   . Rheumatic fever   . Seasonal allergies     Past Surgical History:  Procedure Laterality Date  . WISDOM TOOTH EXTRACTION      There were no vitals filed for this visit.  Subjective Assessment - 06/11/17 1406    Subjective  My pelvic crest area feels better, but I feel more tightness along my paraspinals.    Currently in Pain?  -- Tightness in my paraspinals 1/10    Multiple Pain Sites  No                      [facility] Adult PT Treatment/Exercise - 06/11/17 0001      Knee/Hip Exercises: Stretches   Active Hamstring Stretch  Right;3 reps;30 seconds    Piriformis Stretch  -- Hip ER release bil, L1 foot on mat 30 sec 2x     Other Knee/Hip Stretches  SKTC 5x20 sec each; Lt/Rt glute stretch 3x30 sec     Other Knee/Hip Stretches  Knee to opposite  shoulder  2x20 sec bil       Knee/Hip Exercises: Aerobic   Recumbent Bike  L7 x 10 min      Knee/Hip Exercises: Machines for Strengthening   Cybex Leg Press  Seat 9 110# Bil RT> LT 20x       Knee/Hip Exercises: Seated   Sit to Sand  -- 15x no UE      Knee/Hip Exercises: Supine   Bridges  Strengthening;Right;Left;2 sets;10 reps    Single Leg Bridge  -- Each side 8x VC to slow speed      Moist Heat Therapy   Number Minutes Moist Heat  15 Minutes    Moist Heat Location  Other (comment) Rt buttock/low back       Electrical Stimulation   Electrical Stimulation Location  Rt buttock and low back     Electrical Stimulation Action  IFC    Electrical Stimulation Parameters  80-150 HZin prone     Electrical Stimulation Goals  Pain             PT Education - 06/11/17 1446    Education provided  Yes    Education Details  Final HEP    Person(s) Educated  Patient  Methods  Explanation;Demonstration;Tactile cues;Handout;Verbal cues    Comprehension  Verbalized understanding;Returned demonstration       PT Short Term Goals - 06/11/17 1409      PT SHORT TERM GOAL #1   Title  Pt will be independent with his initial HEP to decrease pain and improved flexibility.    Time  3    Period  Weeks      PT SHORT TERM GOAL #2   Title  Pt will active Rt gluteals into extension with 25% or more less pain to assist with sit to stand.    Time  3    Period  Weeks      PT SHORT TERM GOAL #3   Title  independent with initial HEP    Time  4    Period  Weeks    Status  Achieved        PT Long Term Goals - 06/11/17 1410      PT LONG TERM GOAL #1   Title  Pt will be indepedent with HEP and demonstrate good understanding of self progressions of exericse for completion at discharge.    Time  8    Period  Weeks    Status  Achieved      PT LONG TERM GOAL #2   Title  Pt will be able to complete sit to stand with decreased pain atleast 75% from the start of therapy.    Time  8    Period  Weeks    Status  Achieved No pain      PT LONG TERM GOAL  #3   Title  Pt will be able to properly lift and transfer his wife x2 trials without the need for additional cuing or support and without increase in Rt buttock/back pain    Time  8    Period  Weeks    Status  Achieved            Plan - 06/11/17 1405    Clinical Impression Statement  Pt has met most of his PT goals. We discussed at length what HEP he could be somewhat consistent as he cannot get to the gym and finds little time throughout the day to do his HEP. PTA suggested any stretches and exercises he could do in the bed while he was spending time with his wife. He thought this was something he could manage and do consistently. He continues to demonstrate tightness in his Rt hip. No pain with strengthening exercises, no pain with sit to stand.      Rehab Potential  Excellent    Clinical Impairments Affecting Rehab Potential  None    PT Frequency  1x / week    PT Duration  8 weeks    PT Treatment/Interventions  Cryotherapy;Electrical Stimulation;Iontophoresis '4mg'$ /ml Dexamethasone;Moist Heat;Ultrasound;Therapeutic exercise;Therapeutic activities;Neuromuscular re-education;Patient/family education;Manual techniques;Dry needling;Taping;ADLs/Self Care Home Management;Traction    PT Next Visit Plan  Pt states he might like to DC next visit.     Consulted and Agree with Plan of Care  --       Patient will benefit from skilled therapeutic intervention in order to improve the following deficits and impairments:  Increased fascial restricitons, Pain, Increased muscle spasms, Decreased activity tolerance, Decreased range of motion, Hypomobility, Impaired flexibility, Postural dysfunction, Improper body mechanics  Visit Diagnosis: Right low back pain, unspecified chronicity, with sciatica presence unspecified  Pain in right hip  Muscle spasm of back  Pain in right thigh  Muscle weakness (generalized)  Cramp and spasm     Problem List Patient Active Problem List   Diagnosis Date  Noted  . Chronic diastolic heart failure ([place]) 03/21/2016  . Murmur, cardiac 01/15/2016  . Dyspnea 01/15/2016  . Abnormal ECG 01/15/2016  . Hypertriglyceridemia 01/15/2016    Naama Sappington, PTA 06/11/2017, 3:52 PM  [facility] [address], [place], [place], [address] Phone: [phone]   Fax:  [phone]  Name: Marc Thornton MRN: [id number] Date of Birth: March 17, 1946

## 2017-06-11 NOTE — Patient Instructions (Addendum)
Knee to Chest    Lying supine, bend involved knee to chest ___ times. Repeat with other leg. Do ___ times per day.  Copyright  VHI. All rights reserved.  HIP: External Rotation    Sit at edge of surface. Cross one leg over other knee. Press down gently on knee. Hold 20___ seconds. _1-_3_ reps per set, __2-3_ sets per day, this is an alternative to the exercise laying on your back where you flop your knee outward as the ankle  rests on the opposite shin bone. Lay there in that position for 1 min. Do each leg.   Then pull knee to OPPOSITE shoulder. Sprinkle this exercise throughout your day. 30 sec to 1 min holds, as many as you can! Copyright  VHI. All rights reserved.   Hamstring: Towel Stretch (Supine)    Lie on back. Loop towel around left foot, hip and knee at 90. Straighten knee and pull foot toward body. Hold _30 sec to 1 min__ seconds. Relax. Repeat __1-3_ times. Do __many_ times a day. Repeat with other leg.    Copyright  VHI. All rights reserved.   Flexors, Supine Bridge    Lie supine, feet shoulder-width apart. Lift hips toward ceiling. Hold _5__ seconds. Repeat _10__ times per session. Do _1-3__ sessions per day. DO SLOWLY!!!  You can also do slow marching or do single leg bridging.  Copyright  VHI. All rights reserved.

## 2017-06-18 ENCOUNTER — Encounter: Payer: Self-pay | Admitting: Physical Therapy

## 2017-06-18 ENCOUNTER — Ambulatory Visit: Payer: Medicare PPO | Admitting: Physical Therapy

## 2017-06-18 DIAGNOSIS — M545 Low back pain: Secondary | ICD-10-CM | POA: Diagnosis not present

## 2017-06-18 DIAGNOSIS — R252 Cramp and spasm: Secondary | ICD-10-CM

## 2017-06-18 DIAGNOSIS — M6281 Muscle weakness (generalized): Secondary | ICD-10-CM

## 2017-06-18 DIAGNOSIS — M62830 Muscle spasm of back: Secondary | ICD-10-CM

## 2017-06-18 DIAGNOSIS — M79651 Pain in right thigh: Secondary | ICD-10-CM

## 2017-06-18 DIAGNOSIS — M25551 Pain in right hip: Secondary | ICD-10-CM

## 2017-06-18 NOTE — Therapy (Signed)
[facility] [address] [place], [place], [address] Phone: [phone]   Fax:  [phone]  Physical Therapy Treatment  Patient Details  Name: Marc Thornton MRN: [id number] Date of Birth: [birth year] Referring Provider: Beaulah Dinning, MD   Encounter Date: 06/18/2017  PT End of Session - 06/18/17 1450    Visit Number  6    Date for PT Re-Evaluation  07/08/17    Authorization Type  [employer]    Authorization Time Period  05/13/17 to 07/08/17    PT Start Time  1448    PT Stop Time  1530    PT Time Calculation (min)  42 min    Activity Tolerance  Patient tolerated treatment well;No increased pain    Behavior During Therapy  WFL for tasks assessed/performed       Past Medical History:  Diagnosis Date  . Arthritis   . BPH (benign prostatic hyperplasia)   . Chicken pox   . DDD (degenerative disc disease), cervical   . Depression   . Elevated PSA   . Fracture of foot bone, right, closed 1964  . GERD (gastroesophageal reflux disease)   . Heart murmur   . Hyperlipidemia   . Polyneuropathy   . Rheumatic fever   . Seasonal allergies     Past Surgical History:  Procedure Laterality Date  . WISDOM TOOTH EXTRACTION      There were no vitals filed for this visit.  Subjective Assessment - 06/18/17 1450    Subjective  I have had a good week. I got my socks on by myself which is great.    Patient Stated Goals  reduce pain in right leg    Currently in Pain?  No/denies    Multiple Pain Sites  No                      [facility] Adult PT Treatment/Exercise - 06/18/17 0001      Knee/Hip Exercises: Stretches   Active Hamstring Stretch  Right;3 reps;30 seconds    Piriformis Stretch  -- Hip ER release bil, L1 foot on mat 30 sec 2x     Other Knee/Hip Stretches  SKTC 5x20 sec each; Lt/Rt glute stretch 3x30 sec     Other Knee/Hip Stretches  Knee to opposite  shoulder  2x20 sec bil       Knee/Hip Exercises: Aerobic    Recumbent Bike  L6 x 10 min               PT Short Term Goals - 06/18/17 1451      PT SHORT TERM GOAL #1   Title  Pt will be independent with his initial HEP to decrease pain and improved flexibility.    Time  3    Period  Weeks    Status  Achieved        PT Long Term Goals - 06/11/17 1410      PT LONG TERM GOAL #1   Title  Pt will be indepedent with HEP and demonstrate good understanding of self progressions of exericse for completion at discharge.    Time  8    Period  Weeks    Status  Achieved      PT LONG TERM GOAL #2   Title  Pt will be able to complete sit to stand with decreased pain atleast 75% from the start of therapy.    Time  8    Period  Weeks  Status  Achieved No pain      PT LONG TERM GOAL #3   Title  Pt will be able to properly lift and transfer his wife x2 trials without the need for additional cuing or support and without increase in Rt buttock/back pain    Time  8    Period  Weeks    Status  Achieved            Plan - 06/18/17 1451    Clinical Impression Statement  Pt reports that he has had a good week, not much pain at all. He does report he is not very consisent with any HEP due to his schedule. Pt does well with his stretches when kept on task. Time was slightly shortened as he had to leave early. He would like to schedule 1x more in order to get another interferential treatment in.     Rehab Potential  Excellent    Clinical Impairments Affecting Rehab Potential  None    PT Frequency  1x / week    PT Duration  8 weeks    PT Treatment/Interventions  Cryotherapy;Electrical Stimulation;Iontophoresis 4mg /ml Dexamethasone;Moist Heat;Ultrasound;Therapeutic exercise;Therapeutic activities;Neuromuscular re-education;Patient/family education;Manual techniques;Dry needling;Taping;ADLs/Self Care Home Management;Traction    PT Next Visit Plan  Pt states he might like to DC next visit.     Consulted and Agree with Plan of Care  Patient        Patient will benefit from skilled therapeutic intervention in order to improve the following deficits and impairments:  Increased fascial restricitons, Pain, Increased muscle spasms, Decreased activity tolerance, Decreased range of motion, Hypomobility, Impaired flexibility, Postural dysfunction, Improper body mechanics  Visit Diagnosis: Right low back pain, unspecified chronicity, with sciatica presence unspecified  Pain in right hip  Muscle spasm of back  Pain in right thigh  Muscle weakness (generalized)  Cramp and spasm     Problem List Patient Active Problem List   Diagnosis Date Noted  . Chronic diastolic heart failure ([place]) 03/21/2016  . Murmur, cardiac 01/15/2016  . Dyspnea 01/15/2016  . Abnormal ECG 01/15/2016  . Hypertriglyceridemia 01/15/2016    Munir Victorian ,PTA 06/18/2017, 3:26 PM  [facility] [address], [place], [place], [address] Phone: [phone]   Fax:  [phone]  Name: JOHNE BUCKLE MRN: [id number] Date of Birth: 26-Mar-1946

## 2017-06-25 ENCOUNTER — Ambulatory Visit: Payer: Self-pay | Admitting: Physical Therapy

## 2017-07-07 ENCOUNTER — Encounter: Payer: Self-pay | Admitting: Physical Therapy

## 2017-07-07 ENCOUNTER — Encounter (INDEPENDENT_AMBULATORY_CARE_PROVIDER_SITE_OTHER): Payer: Self-pay

## 2017-07-07 ENCOUNTER — Ambulatory Visit: Payer: Medicare PPO | Attending: Adult Health | Admitting: Physical Therapy

## 2017-07-07 DIAGNOSIS — M25551 Pain in right hip: Secondary | ICD-10-CM | POA: Insufficient documentation

## 2017-07-07 DIAGNOSIS — M6281 Muscle weakness (generalized): Secondary | ICD-10-CM | POA: Insufficient documentation

## 2017-07-07 DIAGNOSIS — R252 Cramp and spasm: Secondary | ICD-10-CM | POA: Insufficient documentation

## 2017-07-07 DIAGNOSIS — M79651 Pain in right thigh: Secondary | ICD-10-CM | POA: Diagnosis present

## 2017-07-07 DIAGNOSIS — M62830 Muscle spasm of back: Secondary | ICD-10-CM | POA: Diagnosis present

## 2017-07-07 DIAGNOSIS — M545 Low back pain: Secondary | ICD-10-CM | POA: Insufficient documentation

## 2017-07-07 NOTE — Therapy (Addendum)
[facility] [address] [place], [place], [address] Phone: [phone]   Fax:  [phone]  Physical Therapy Treatment  Patient Details  Name: Marc Thornton MRN: [id number] Date of Birth: 06-03-1945 Referring Provider: Beaulah Dinning, MD   Encounter Date: 07/07/2017  PT End of Session - 07/07/17 1450    Visit Number  7    Date for PT Re-Evaluation  07/08/17    Authorization Type  [employer]    Authorization Time Period  05/13/17 to 07/08/17    PT Start Time  1450    PT Stop Time  1555    PT Time Calculation (min)  65 min    Activity Tolerance  Patient tolerated treatment well;No increased pain    Behavior During Therapy  WFL for tasks assessed/performed       Past Medical History:  Diagnosis Date  . Arthritis   . BPH (benign prostatic hyperplasia)   . Chicken pox   . DDD (degenerative disc disease), cervical   . Depression   . Elevated PSA   . Fracture of foot bone, right, closed 1964  . GERD (gastroesophageal reflux disease)   . Heart murmur   . Hyperlipidemia   . Polyneuropathy   . Rheumatic fever   . Seasonal allergies     Past Surgical History:  Procedure Laterality Date  . WISDOM TOOTH EXTRACTION      There were no vitals filed for this visit.  Subjective Assessment - 07/07/17 1452    Subjective  I have been getting outside and getting my exercise that way.     Currently in Pain?  No/denies    Multiple Pain Sites  No                                 PT Short Term Goals - 07/07/17 1522      PT SHORT TERM GOAL #2   Title  Pt will active Rt gluteals into extension with 25% or more less pain to assist with sit to stand.    Time  3    Status  Achieved        PT Long Term Goals - 07/07/17 1522      PT LONG TERM GOAL #1   Title  Pt will be indepedent with HEP and demonstrate good understanding of self progressions of exericse for completion at discharge.    Time  8     Period  Weeks    Status  Achieved      PT LONG TERM GOAL #2   Title  Pt will be able to complete sit to stand with decreased pain atleast 75% from the start of therapy.    Time  8    Period  Weeks    Status  Achieved      PT LONG TERM GOAL #3   Title  Pt will be able to properly lift and transfer his wife x2 trials without the need for additional cuing or support and without increase in Rt buttock/back pain    Time  8    Period  Weeks    Status  Achieved      PT LONG TERM GOAL #4   Title  Pt will demo improved quadriceps flexibility evident by his ability to reach atleast 100 deg of knee flexion during prone testing, which will improve his standing mechanics and activation of the gluteals.  Time  8    Period  Weeks    Status  Achieved            Plan - 07/07/17 1451    Clinical Impression Statement  Pt reduced his FOTO score and increased his knee flexion in the prone position. He is trying hard to be more active at home. It is still difficult day to day to have a routine as the requirements for taking care of his wife vary day to day. At this time he reports not having any significant limitations. Lumbar flexion has improved some, extension remains very limited.     Rehab Potential  Excellent    Clinical Impairments Affecting Rehab Potential  None    PT Frequency  1x / week    PT Duration  8 weeks    PT Treatment/Interventions  Cryotherapy;Electrical Stimulation;Iontophoresis '4mg'$ /ml Dexamethasone;Moist Heat;Ultrasound;Therapeutic exercise;Therapeutic activities;Neuromuscular re-education;Patient/family education;Manual techniques;Dry needling;Taping;ADLs/Self Care Home Management;Traction    PT Next Visit Plan  Discharge to HEP    Consulted and Agree with Plan of Care  Patient       Patient will benefit from skilled therapeutic intervention in order to improve the following deficits and impairments:  Increased fascial restricitons, Pain, Increased muscle spasms, Decreased  activity tolerance, Decreased range of motion, Hypomobility, Impaired flexibility, Postural dysfunction, Improper body mechanics  Visit Diagnosis: Right low back pain, unspecified chronicity, with sciatica presence unspecified  Pain in right hip  Muscle spasm of back  Pain in right thigh  Muscle weakness (generalized)  Cramp and spasm     Problem List Patient Active Problem List   Diagnosis Date Noted  . Chronic diastolic heart failure ([place]) 03/21/2016  . Murmur, cardiac 01/15/2016  . Dyspnea 01/15/2016  . Abnormal ECG 01/15/2016  . Hypertriglyceridemia 01/15/2016     Myrene Galas, PTA 07/08/17 10:13 AM  [facility] [address], [place], [place], [address] Phone: [phone]   Fax:  [phone]  Name: Marc Thornton MRN: [id number] Date of Birth: Apr 27, 1945  PHYSICAL THERAPY DISCHARGE SUMMARY  Visits from Start of Care: 7  Current functional level related to goals / functional outcomes: See above.    Remaining deficits: See above.    Education / Equipment: HEP Plan: Patient agrees to discharge.  Patient goals were met. Patient is being discharged due to meeting the stated rehab goals. Thank you for the referral. Earlie Counts, PT 07/08/17 10:13 AM   ?????

## 2017-12-11 ENCOUNTER — Ambulatory Visit (HOSPITAL_COMMUNITY)
Admission: RE | Admit: 2017-12-11 | Discharge: 2017-12-11 | Disposition: A | Discharge status: Home / Self Care | Payer: Medicare PPO | Source: Ambulatory Visit | Attending: Adult Health | Admitting: Adult Health

## 2017-12-11 ENCOUNTER — Encounter: Payer: Self-pay | Admitting: Adult Health

## 2017-12-11 ENCOUNTER — Ambulatory Visit: Payer: Medicare PPO | Admitting: Adult Health

## 2017-12-11 VITALS — BP 114/74 | Temp 97.7°F | Wt 198.0 lb

## 2017-12-11 DIAGNOSIS — Z23 Encounter for immunization: Secondary | ICD-10-CM

## 2017-12-11 DIAGNOSIS — M7989 Other specified soft tissue disorders: Secondary | ICD-10-CM | POA: Insufficient documentation

## 2017-12-11 NOTE — Progress Notes (Signed)
*  Preliminary Results* Left lower extremity venous duplex completed. Left lower extremity is negative for deep vein thrombosis. There is no evidence of left Baker's cyst. Called results to nurse.  12/11/2017 10:10 AM  Marc Thornton

## 2017-12-11 NOTE — Addendum Note (Signed)
Addended by: Apolinar Junes on: 12/11/2017 08:36 AM   Modules accepted: Orders

## 2017-12-11 NOTE — Progress Notes (Addendum)
Subjective:    Patient ID: Marc Thornton, male    DOB: 1945-08-17, 72 y.o.   MRN: [id number]  HPI  72 year old male who  has a past medical history of Arthritis, BPH (benign prostatic hyperplasia), Chicken pox, DDD (degenerative disc disease), cervical, Depression, Elevated PSA, Fracture of foot bone, right, closed (1964), GERD (gastroesophageal reflux disease), Heart murmur, Hyperlipidemia, Polyneuropathy, Rheumatic fever, and Seasonal allergies.  He presents to the office today for an acute visit. He reports over the last 3 months or so he has noticed left lower extremity swelling that has become progressively worse over the last 3-4 days. He does report some pain in the calf and the back of his knee. Denies any redness or warmth.   Over the last 2-3 days he has noticed trace edema in right lower leg.   He has history of Cardiomyopathy but has not been to see his Cardiologist in some time . Last Echo was in 01/2016   Left ventricle: The cavity size was normal. Wall thickness was   normal. Systolic function was normal. The estimated ejection   fraction was in the range of 60% to 65%. Wall motion was normal;   there were no regional wall motion abnormalities. Doppler   parameters are consistent with abnormal left ventricular   relaxation (grade 1 diastolic dysfunction). - Tricuspid valve: There was trivial regurgitation.  Wt Readings from Last 3 Encounters:  12/11/17 198 lb (89.8 kg)  03/11/17 193 lb (87.5 kg)  02/11/17 194 lb (88 kg)   Review of Systems See HPI   Past Medical History:  Diagnosis Date  . Arthritis   . BPH (benign prostatic hyperplasia)   . Chicken pox   . DDD (degenerative disc disease), cervical   . Depression   . Elevated PSA   . Fracture of foot bone, right, closed 1964  . GERD (gastroesophageal reflux disease)   . Heart murmur   . Hyperlipidemia   . Polyneuropathy   . Rheumatic fever   . Seasonal allergies     Social History   Socioeconomic  History  . Marital status: Married    Spouse name: Not on file  . Number of children: Not on file  . Years of education: Not on file  . Highest education level: Not on file  Occupational History  . Not on file  Social Needs  . Financial resource strain: Not on file  . Food insecurity:    Worry: Not on file    Inability: Not on file  . Transportation needs:    Medical: Not on file    Non-medical: Not on file  Tobacco Use  . Smoking status: Former Research scientist (life sciences)  . Smokeless tobacco: Former Network engineer and Sexual Activity  . Alcohol use: Yes  . Drug use: No  . Sexual activity: Not on file  Lifestyle  . Physical activity:    Days per week: Not on file    Minutes per session: Not on file  . Stress: Not on file  Relationships  . Social connections:    Talks on phone: Not on file    Gets together: Not on file    Attends religious service: Not on file    Active member of club or organization: Not on file    Attends meetings of clubs or organizations: Not on file    Relationship status: Not on file  . Intimate partner violence:    Fear of current or ex partner: Not on file  Emotionally abused: Not on file    Physically abused: Not on file    Forced sexual activity: Not on file  Other Topics Concern  . Not on file  Social History Narrative   Worked as a Community education officer    Married       Past Surgical History:  Procedure Laterality Date  . WISDOM TOOTH EXTRACTION      Family History  Problem Relation Age of Onset  . Arthritis Mother   . Breast cancer Mother   . Arthritis Father   . Heart disease Father   . Stroke Father   . Hypertension Father   . Prostate cancer Father   . Dementia Father   . Stroke Paternal Grandfather     No Known Allergies  Current Outpatient Medications on File Prior to Visit  Medication Sig Dispense Refill  . acetaminophen (TYLENOL) 500 MG tablet Take 500 mg by mouth every 6 (six) hours as needed.    Marland Kitchen aspirin 325 MG tablet Take 325 mg by  mouth daily.     No current facility-administered medications on file prior to visit.     BP 114/74   Temp 97.7 F (36.5 C) (Oral)   Wt 198 lb (89.8 kg)   BMI 26.12 kg/m       Objective:   Physical Exam  Constitutional: He is oriented to person, place, and time. He appears well-developed and well-nourished. No distress.  Cardiovascular: Normal rate, regular rhythm and intact distal pulses. Exam reveals no gallop and no friction rub.  Murmur heard. Pulmonary/Chest: Effort normal and breath sounds normal. No stridor. No respiratory distress. He has no wheezes. He has no rales. He exhibits no tenderness.  Musculoskeletal: He exhibits edema and tenderness.  + 2 pitting edema from ankle to below the knee on left side.  + 1 pitting edema in right leg.  Pain with palpation to left calf. No redness/warmth noted   Neurological: He is alert and oriented to person, place, and time.  Skin: He is not diaphoretic.  Nursing note and vitals reviewed.     Assessment & Plan:  1. Swelling of left lower extremity - Need to r/o DVT  - VAS [place] LOWER EXTREMITY VENOUS (DVT); Future - Consider adding diuretic  [name], NP

## 2017-12-11 NOTE — Addendum Note (Signed)
Addended by: Miles Costain T on: 12/11/2017 08:47 AM   Modules accepted: Orders

## 2018-03-27 ENCOUNTER — Ambulatory Visit (INDEPENDENT_AMBULATORY_CARE_PROVIDER_SITE_OTHER): Payer: Medicare PPO

## 2018-03-27 DIAGNOSIS — Z23 Encounter for immunization: Secondary | ICD-10-CM

## 2018-11-26 ENCOUNTER — Ambulatory Visit (INDEPENDENT_AMBULATORY_CARE_PROVIDER_SITE_OTHER): Payer: Medicare PPO | Admitting: Adult Health

## 2018-11-26 ENCOUNTER — Other Ambulatory Visit: Payer: Self-pay

## 2018-11-26 ENCOUNTER — Encounter: Payer: Self-pay | Admitting: Adult Health

## 2018-11-26 VITALS — BP 112/64 | Temp 98.7°F | Ht 72.5 in | Wt 191.0 lb

## 2018-11-26 DIAGNOSIS — I5032 Chronic diastolic (congestive) heart failure: Secondary | ICD-10-CM

## 2018-11-26 DIAGNOSIS — Z0001 Encounter for general adult medical examination with abnormal findings: Secondary | ICD-10-CM | POA: Diagnosis not present

## 2018-11-26 DIAGNOSIS — Z23 Encounter for immunization: Secondary | ICD-10-CM | POA: Diagnosis not present

## 2018-11-26 DIAGNOSIS — Z125 Encounter for screening for malignant neoplasm of prostate: Secondary | ICD-10-CM

## 2018-11-26 DIAGNOSIS — Z0000 Encounter for general adult medical examination without abnormal findings: Secondary | ICD-10-CM

## 2018-11-26 DIAGNOSIS — Z1211 Encounter for screening for malignant neoplasm of colon: Secondary | ICD-10-CM

## 2018-11-26 NOTE — Progress Notes (Signed)
Subjective:    Patient ID: Marc Thornton, male    DOB: [birth year], 73 y.o.   MRN: [id number]  HPI Patient presents for yearly preventative medicine examination. He is a pleasant 73 year old male who  has a past medical history of Arthritis, BPH (benign prostatic hyperplasia), Cardiomyopathy ([place]), Chicken pox, DDD (degenerative disc disease), cervical, Depression, Elevated PSA, Fracture of foot bone, right, closed Jul 25, 1962), GERD (gastroesophageal reflux disease), Heart murmur, Hyperlipidemia, Polyneuropathy, Rheumatic fever, and Seasonal allergies.  His last CPE was in 2015/07/25. He has been the main caregiver of his wife, who passed away recently.  He reports he is doing about as well as he can be since his wife's death.   CHF -diagnosed in 2015-07-25 by cardiology.  He had echo done in October 2017 which was essentially normal except for mild diastolic dysfunction.  There was no serious abnormality to explain her murmur.  He was then scheduled for stress test which showed a normal EF of 54% and no evidence of ischemia.  Audiology was unsure if his dyspnea was more related to latent and untreated hypertension or due to infiltrative cardiomyopathy such as amyloidosis.  He was scheduled for 24-hour blood pressure monitor and cardiac MRI.  His wife's illness kept him from completing the cardiac MRI.  He has not followed up with cardiology since.   Does report that he is mowing the yard without difficulty and does not become short of breath.  Every once in a while he will get a "twinge" in his chest this lasts a few seconds and then goes away.  Hypertriglyceridemia-last report with triglycerides being 303 in July 25, 2015  BP Readings from Last 3 Encounters:  11/26/18 112/64  12/11/17 114/74  03/11/17 126/70   H/o elevated PSA -Per scanned records PSA level of 5.2 in 2014/07/25  All immunizations and health maintenance protocols were reviewed with the patient and needed orders were placed.  Appropriate screening  laboratory values were ordered for the patient including screening of hyperlipidemia, renal function and hepatic function. If indicated by BPH, a PSA was ordered.  Medication reconciliation,  past medical history, social history, problem list and allergies were reviewed in detail with the patient  Goals were established with regard to weight loss, exercise, and  diet in compliance with medications.  His diet has been poor and mostly includes highly processed foods and he has not been exercising.  His last colonoscopy was in 2004-07-24  Review of Systems  Constitutional: Negative.   HENT: Negative.   Eyes: Negative.   Respiratory: Negative.   Cardiovascular: Positive for leg swelling.  Gastrointestinal: Negative.   Endocrine: Negative.   Genitourinary: Negative.   Musculoskeletal: Positive for arthralgias.  Skin: Negative.   Allergic/Immunologic: Negative.   Neurological: Negative.   Hematological: Negative.   Psychiatric/Behavioral: Positive for sleep disturbance.  All other systems reviewed and are negative.  Past Medical History:  Diagnosis Date  . Arthritis   . BPH (benign prostatic hyperplasia)   . Cardiomyopathy ([place])   . Chicken pox   . DDD (degenerative disc disease), cervical   . Depression   . Elevated PSA   . Fracture of foot bone, right, closed 1962-07-25  . GERD (gastroesophageal reflux disease)   . Heart murmur   . Hyperlipidemia   . Polyneuropathy   . Rheumatic fever   . Seasonal allergies     Social History   Socioeconomic History  . Marital status: Married    Spouse name: Not on file  .  Number of children: Not on file  . Years of education: Not on file  . Highest education level: Not on file  Occupational History  . Not on file  Social Needs  . Financial resource strain: Not on file  . Food insecurity    Worry: Not on file    Inability: Not on file  . Transportation needs    Medical: Not on file    Non-medical: Not on file  Tobacco Use  . Smoking  status: Former Research scientist (life sciences)  . Smokeless tobacco: Former Network engineer and Sexual Activity  . Alcohol use: Yes  . Drug use: No  . Sexual activity: Not on file  Lifestyle  . Physical activity    Days per week: Not on file    Minutes per session: Not on file  . Stress: Not on file  Relationships  . Social Herbalist on phone: Not on file    Gets together: Not on file    Attends religious service: Not on file    Active member of club or organization: Not on file    Attends meetings of clubs or organizations: Not on file    Relationship status: Not on file  . Intimate partner violence    Fear of current or ex partner: Not on file    Emotionally abused: Not on file    Physically abused: Not on file    Forced sexual activity: Not on file  Other Topics Concern  . Not on file  Social History Narrative   Worked as a Community education officer    Married       Past Surgical History:  Procedure Laterality Date  . WISDOM TOOTH EXTRACTION      Family History  Problem Relation Age of Onset  . Arthritis Mother   . Breast cancer Mother   . Arthritis Father   . Heart disease Father   . Stroke Father   . Hypertension Father   . Prostate cancer Father   . Dementia Father   . Stroke Paternal Grandfather     No Known Allergies  Current Outpatient Medications on File Prior to Visit  Medication Sig Dispense Refill  . acetaminophen (TYLENOL) 500 MG tablet Take 500 mg by mouth every 6 (six) hours as needed.    Marland Kitchen aspirin 325 MG tablet Take 325 mg by mouth daily.     No current facility-administered medications on file prior to visit.     BP 112/64   Temp 98.7 F (37.1 C)   Ht 6' 0.5" (1.842 m)   Wt 191 lb (86.6 kg)   BMI 25.55 kg/m       Objective:   Physical Exam Vitals signs and nursing note reviewed.  Constitutional:      General: He is not in acute distress.    Appearance: Normal appearance. He is normal weight. He is not diaphoretic.  HENT:     Head: Normocephalic and  atraumatic.     Right Ear: Tympanic membrane, ear canal and external ear normal. There is no impacted cerumen.     Left Ear: Tympanic membrane, ear canal and external ear normal. There is no impacted cerumen.     Nose: Nose normal. No congestion or rhinorrhea.     Mouth/Throat:     Mouth: Mucous membranes are moist.     Pharynx: Oropharynx is clear. No oropharyngeal exudate or posterior oropharyngeal erythema.  Eyes:     General: No scleral icterus.  Right eye: No discharge.        Left eye: No discharge.     Conjunctiva/sclera: Conjunctivae normal.     Pupils: Pupils are equal, round, and reactive to light.  Neck:     Musculoskeletal: Normal range of motion and neck supple. No muscular tenderness.     Thyroid: No thyromegaly.     Vascular: No JVD.     Trachea: No tracheal deviation.  Cardiovascular:     Rate and Rhythm: Normal rate and regular rhythm.     Pulses: Normal pulses.     Heart sounds: Murmur present. No friction rub. No gallop.   Pulmonary:     Effort: Pulmonary effort is normal. No respiratory distress.     Breath sounds: Normal breath sounds. No stridor. No wheezing, rhonchi or rales.  Chest:     Chest wall: No tenderness.  Abdominal:     General: Bowel sounds are normal. There is no distension.     Palpations: Abdomen is soft. There is no mass.     Tenderness: There is no abdominal tenderness. There is no right CVA tenderness, left CVA tenderness, guarding or rebound.     Hernia: No hernia is present.  Musculoskeletal: Normal range of motion.        General: No swelling, tenderness, deformity or signs of injury.     Right lower leg: No edema.     Left lower leg: No edema.  Lymphadenopathy:     Cervical: No cervical adenopathy.  Skin:    General: Skin is warm and dry.     Capillary Refill: Capillary refill takes less than 2 seconds.     Coloration: Skin is not jaundiced or pale.     Findings: No bruising, erythema, lesion or rash.  Neurological:      General: No focal deficit present.     Mental Status: He is alert and oriented to person, place, and time. Mental status is at baseline.     Cranial Nerves: No cranial nerve deficit.     Sensory: No sensory deficit.     Motor: No weakness or abnormal muscle tone.     Coordination: Coordination normal.     Gait: Gait normal.     Deep Tendon Reflexes: Reflexes are normal and symmetric. Reflexes normal.  Psychiatric:        Mood and Affect: Mood normal.        Behavior: Behavior normal.        Thought Content: Thought content normal.        Judgment: Judgment normal.       Assessment & Plan:  1. Routine general medical examination at a health care facility -He was encouraged to work on lifestyle modifications, needs to eat less high processed foods and exercise more. -Check blood work and follow-up with recommendations after that.  He was advised to follow-up with cardiology - CBC with Differential/Platelet; Future - Comprehensive metabolic panel; Future - Hemoglobin A1c; Future - Lipid panel; Future - TSH; Future  2. Colon cancer screening  - Ambulatory referral to Gastroenterology  3. Prostate cancer screening  - PSA; Future  4. Need for prophylactic vaccination and inoculation against influenza  - Flu Vaccine QUAD High Dose(Fluad)  5. Need for vaccination with 13-polyvalent pneumococcal conjugate vaccine  - Pneumococcal conjugate vaccine 13-valent IM  6. Chronic diastolic heart failure ([place]) - Follow up with cardiology  - CBC with Differential/Platelet; Future - Comprehensive metabolic panel; Future - Hemoglobin A1c; Future - Lipid panel; Future -  TSH; Future    [name], NP

## 2018-11-26 NOTE — Patient Instructions (Addendum)
We will follow up with you regarding your labs and then figure out our next steps  Please follow up with cardiology   Make an appointment for labs tomorrow morning.   Someone will call you to schedule your appointment for a colonoscopy

## 2018-11-27 ENCOUNTER — Other Ambulatory Visit (INDEPENDENT_AMBULATORY_CARE_PROVIDER_SITE_OTHER): Payer: Medicare PPO

## 2018-11-27 ENCOUNTER — Other Ambulatory Visit: Payer: Self-pay

## 2018-11-27 ENCOUNTER — Encounter: Payer: Self-pay | Admitting: Adult Health

## 2018-11-27 ENCOUNTER — Encounter: Payer: Self-pay | Admitting: Gastroenterology

## 2018-11-27 DIAGNOSIS — Z125 Encounter for screening for malignant neoplasm of prostate: Secondary | ICD-10-CM | POA: Diagnosis not present

## 2018-11-27 DIAGNOSIS — Z0000 Encounter for general adult medical examination without abnormal findings: Secondary | ICD-10-CM

## 2018-11-27 DIAGNOSIS — I5032 Chronic diastolic (congestive) heart failure: Secondary | ICD-10-CM | POA: Diagnosis not present

## 2018-11-27 LAB — CBC WITH DIFFERENTIAL/PLATELET
Basophils Absolute: 0 10*3/uL (ref 0.0–0.1)
Basophils Relative: 0.6 % (ref 0.0–3.0)
Eosinophils Absolute: 0.2 10*3/uL (ref 0.0–0.7)
Eosinophils Relative: 3.2 % (ref 0.0–5.0)
HCT: 44.6 % (ref 39.0–52.0)
Hemoglobin: 15 g/dL (ref 13.0–17.0)
Lymphocytes Relative: 35.2 % (ref 12.0–46.0)
Lymphs Abs: 2.7 10*3/uL (ref 0.7–4.0)
MCHC: 33.5 g/dL (ref 30.0–36.0)
MCV: 96 fl (ref 78.0–100.0)
Monocytes Absolute: 0.6 10*3/uL (ref 0.1–1.0)
Monocytes Relative: 7.3 % (ref 3.0–12.0)
Neutro Abs: 4.1 10*3/uL (ref 1.4–7.7)
Neutrophils Relative %: 53.7 % (ref 43.0–77.0)
Platelets: 139 10*3/uL — ABNORMAL LOW (ref 150.0–400.0)
RBC: 4.65 Mil/uL (ref 4.22–5.81)
RDW: 13.1 % (ref 11.5–15.5)
WBC: 7.6 10*3/uL (ref 4.0–10.5)

## 2018-11-27 LAB — COMPREHENSIVE METABOLIC PANEL
ALT: 16 U/L (ref 0–53)
AST: 25 U/L (ref 0–37)
Albumin: 4.7 g/dL (ref 3.5–5.2)
Alkaline Phosphatase: 57 U/L (ref 39–117)
BUN: 29 mg/dL — ABNORMAL HIGH (ref 6–23)
CO2: 28 mEq/L (ref 19–32)
Calcium: 9.3 mg/dL (ref 8.4–10.5)
Chloride: 105 mEq/L (ref 96–112)
Creatinine, Ser: 0.88 mg/dL (ref 0.40–1.50)
GFR: 84.51 mL/min (ref >60.00)
Glucose, Bld: 80 mg/dL (ref 70–99)
Potassium: 4.2 mEq/L (ref 3.5–5.1)
Sodium: 141 mEq/L (ref 135–145)
Total Bilirubin: 1 mg/dL (ref 0.2–1.2)
Total Protein: 7.4 g/dL (ref 6.0–8.3)

## 2018-11-27 LAB — PSA: PSA: 4.22 ng/mL — ABNORMAL HIGH (ref 0.10–4.00)

## 2018-11-27 LAB — LIPID PANEL
Cholesterol: 161 mg/dL (ref 0–200)
HDL: 39.4 mg/dL (ref >39.00)
LDL Cholesterol: 94 mg/dL (ref 0–99)
NonHDL: 121.98
Total CHOL/HDL Ratio: 4
Triglycerides: 140 mg/dL (ref 0.0–149.0)
VLDL: 28 mg/dL (ref 0.0–40.0)

## 2018-11-27 LAB — TSH: TSH: 1.5 u[IU]/mL (ref 0.35–4.50)

## 2018-11-27 LAB — HEMOGLOBIN A1C: Hgb A1c MFr Bld: 5.5 % (ref 4.6–6.5)

## 2018-12-01 ENCOUNTER — Encounter: Payer: Self-pay | Admitting: Adult Health

## 2018-12-01 ENCOUNTER — Other Ambulatory Visit: Payer: Self-pay | Admitting: Adult Health

## 2018-12-01 DIAGNOSIS — R972 Elevated prostate specific antigen [PSA]: Secondary | ICD-10-CM

## 2018-12-01 DIAGNOSIS — D696 Thrombocytopenia, unspecified: Secondary | ICD-10-CM

## 2018-12-02 ENCOUNTER — Telehealth: Payer: Self-pay | Admitting: Cardiovascular Disease

## 2018-12-02 NOTE — Telephone Encounter (Signed)
Spoke with pt who report yesterday while cutting grass, he became lightheaded and queasy. He denied SOB or CP at that time but report HR of 52 BP 135/78. Pt report he has noticed over the past few months his resting HR has been between low 50's-49 but was concerned as he had just been very active and HR was still 52. Pt also report occasional chest discomfort that only last a few seconds and a little SOB with exercise. Pt report he has been under a lot of stress lately as he was the sole cargiver of his wife who recently passed away 3 weeks ago.   Pt was last seen in office by Dr. Loletha Grayer on 03/21/16. Nurse scheduled first available appointment with Jory Sims, DNP on 12/09/18 at 10:45 and advised pt to report to ED if CP reoccur or have symptoms of feeling like he is going to pass out. Pt voiced understanding. Pt also inquiring if he should hold off on yard work until appointment date.

## 2018-12-02 NOTE — Telephone Encounter (Signed)
° °  New message   STAT if HR is under 50 or over 120 (normal HR is 60-100 beats per minute)  1) What is your heart rate? 60  2) Do you have a log of your heart rate readings (document readings)? 52  Do you have any other symptoms? Weak, lightheaded

## 2018-12-08 NOTE — Progress Notes (Signed)
Cardiology Office Note   Date:  12/09/2018   ID:  Marc Thornton, DOB [birth year], MRN [id number]  PCP:  Marc Peng, NP  Cardiologist:  Dr.Croitoru  CC:Follow Up   History of Present Illness: Marc Thornton is a 73 y.o. male who presents for ongoing assessment and management of hypertension, with normal nuclear medicine study in 2017, echocardiogram revealed normal LVEF with diastolic dysfunction, mitral annulus diastolic velocities were markedly reduced and that E/e"ratio was borderline significant for elevated filling pressures.  He has a history of being an [employer] that and spent time in [place] with exposure to agent orange.  He has a strong family history of dementia, cerebrovascular disease, and CAD in his history.  Most notably his father had triple bypass at the age of 74.  On last office visit with with Dr. Sallyanne Thornton, on 03/21/2016 his EKG revealed normal sinus rhythm with prominent left ventricular hypertrophy.  There was a sudden RS transition from V1 to V2 with deep Q waves in aVL.  He was also noted to have prominent secondary repolarization abnormalities with normal QTC.  At the time of that office visit the patient was complaining of dyspnea on exertion.  It was felt that this was related to hypertension or primary hypertrophic cardiomyopathy.  He was scheduled for cardiac MRI and a 24-hour blood pressure monitor.  He was recommended to have initial treatment using chlorthalidone.  Mr. [name] called our office on 12/02/2018 with complaints of lightheadedness and queasiness after cutting his grass.  He denies shortness of breath or chest pain but noted that his heart rate was 52 bpm blood pressure was 135/78.  He stated that his heart rate is been running low over the last few months in the 50s.  He was concerned that even though he was very active mowing the lawn his heart rate was still very low.  The patient also stated that his wife passed away 3 weeks ago and he has been under a lot of  stress concerning her passing.  He brings with him a print out of his BP's and HR with grafts and scatter plot illustrations. He states that he has not had any further episodes of weakness and dizziness. He felt he may have been overheated. He is more concerned about is HR today than his BP.  He speaks at length about his concerns about cardiomyopathy, abnormal EKG and progression of his symptoms. He refuses MRI as he does not want to have contrast injection.   Past Medical History:  Diagnosis Date  . Arthritis   . BPH (benign prostatic hyperplasia)   . Cardiomyopathy ([place])   . Chicken pox   . DDD (degenerative disc disease), cervical   . Depression   . Elevated PSA   . Fracture of foot bone, right, closed 1964  . GERD (gastroesophageal reflux disease)   . Heart murmur   . Hyperlipidemia   . Polyneuropathy   . Rheumatic fever   . Seasonal allergies     Past Surgical History:  Procedure Laterality Date  . WISDOM TOOTH EXTRACTION       Current Outpatient Medications  Medication Sig Dispense Refill  . acetaminophen (TYLENOL) 500 MG tablet Take 500 mg by mouth every 6 (six) hours as needed.    . Cholecalciferol (VITAMIN D3) 25 MCG (1000 UT) CAPS Take by mouth. Takes 3-4 times a week    . Multiple Vitamins-Minerals (CENTRUM MEN PO) Take by mouth. Takes 3-4 times a week    .  UNABLE TO FIND Solgar homocystine moduator, takes before exercise     No current facility-administered medications for this visit.     Allergies:   Patient has no known allergies.    Social History:  The patient  reports that he has quit smoking. He has quit using smokeless tobacco. He reports current alcohol use. He reports that he does not use drugs.   Family History:  The patient's family history includes Arthritis in his father and mother; Breast cancer in his mother; Dementia in his father; Heart disease in his father; Hypertension in his father; Prostate cancer in his father; Stroke in his father and  paternal grandfather.    ROS: All other systems are reviewed and negative. Unless otherwise mentioned in H&P    PHYSICAL EXAM: VS:  BP 126/64   Pulse 74   Ht 6\' 1"  (1.854 m)   Wt 192 lb 12.8 oz (87.5 kg)   BMI 25.44 kg/m  , BMI Body mass index is 25.44 kg/m. GEN: Well nourished, well developed, in no acute distress HEENT: normal Neck: no JVD, carotid bruits, or masses Cardiac: RRR; 1/6 systolic murmurs, rubs, or gallops,no edema  Respiratory:  Clear to auscultation bilaterally, normal work of breathing GI: soft, nontender, nondistended, + BS MS: no deformity or atrophy. Large bone projection on left foot dorsal area.  Skin: warm and dry, no rash Neuro:  Strength and sensation are intact Psych: euthymic mood, full affect   EKG:  NSR with Biatrial enlargement, LVH, with repolarization abnormality.    Recent Labs: 11/27/2018: ALT 16; BUN 29; Creatinine, Ser 0.88; Hemoglobin 15.0; Platelets 139.0 Repeated and verified X2.; Potassium 4.2; Sodium 141; TSH 1.50    Lipid Panel    Component Value Date/Time   CHOL 161 11/27/2018 1318   TRIG 140.0 11/27/2018 1318   HDL 39.40 11/27/2018 1318   CHOLHDL 4 11/27/2018 1318   VLDL 28.0 11/27/2018 1318   LDLCALC 94 11/27/2018 1318      Wt Readings from Last 3 Encounters:  12/09/18 192 lb 12.8 oz (87.5 kg)  11/26/18 191 lb (86.6 kg)  12/11/17 198 lb (89.8 kg)      Other studies Reviewed: Echocardiogram 14-Jan-2016 Left ventricle: The cavity size was normal. Wall thickness was   normal. Systolic function was normal. The estimated ejection   fraction was in the range of 60% to 65%. Wall motion was normal;   there were no regional wall motion abnormalities. Doppler   parameters are consistent with abnormal left ventricular   relaxation (grade 1 diastolic dysfunction). - Tricuspid valve: There was trivial regurgitation.   Left ventricle                         Value        Reference  LV ID, ED, PLAX chordal        (H)     52.6   mm     43 - 52  LV ID, ES, PLAX chordal                32.8  mm     23 - 38  LV fx shortening, PLAX chordal         38    %      >=29  LV PW thickness, ED                    8.55  mm     ---------  IVS/LV PW  ratio, ED                    0.9          <=1.3  Stroke volume, 2D                      94    ml     ---------  Stroke volume/bsa, 2D                  44    ml/m^2 ---------  LV e&', lateral                         7.23  cm/s   ---------  LV E/e&', lateral                       10.94        ---------  LV e&', medial                          4.6   cm/s   ---------  LV E/e&', medial                        17.2         ---------  LV e&', average                         5.92  cm/s   ---------  LV E/e&', average                       13.37        ---------    Ventricular septum                     Value        Reference  IVS thickness, ED                      7.69  mm     ---------    LVOT                                   Value        Reference  LVOT ID, S                             20    mm     ---------  LVOT area                              3.14  cm^2   ---------  LVOT peak velocity, S                  125   cm/s   ---------  LVOT mean velocity, S                  72.9  cm/s   ---------  LVOT VTI, S                            30    cm     ---------  LVOT peak  gradient, S                  6     mm Hg  ---------    Aorta                                  Value        Reference  Aortic root ID, ED                     35    mm     ---------  Ascending aorta ID, A-P, S             32    mm     ---------    Left atrium                            Value        Reference  LA ID, A-P, ES                         34    mm     ---------  LA ID/bsa, A-P                         1.58  cm/m^2 <=2.2  LA volume, S                           41.8  ml     ---------  LA volume/bsa, S                       19.4  ml/m^2 ---------  LA volume, ES, 1-p A4C                 31.4  ml     ---------  LA  volume/bsa, ES, 1-p A4C             14.6  ml/m^2 ---------  LA volume, ES, 1-p A2C                 49.1  ml     ---------  LA volume/bsa, ES, 1-p A2C             22.8  ml/m^2 ---------    Mitral valve                           Value        Reference  Mitral E-wave peak velocity            79.1  cm/s   ---------  Mitral A-wave peak velocity            97    cm/s   ---------  Mitral deceleration time       (H)     384   ms     150 - 230  Mitral peak gradient, D                3     mm Hg  ---------  Mitral E/A ratio, peak                 0.8          ---------  Legend: (L)  and  (  H)  mark values outside specified reference range.  ASSESSMENT AND PLAN:  1.Bradycarida: When I reviewed his grafts and charts, average HR was in the 70's without normal trends in HR and BP'. There were some outliers of low BP 123456 and 123XX123 systolic. He is very concerned about his HR progression of cardiomyopathy. I will place a Zio 7 day monitor on him to evaluate HR trend and for arrhythmia's. Check TSH at patient's request.   2.Vagal Response:  Likely related to heat and maybe some dehydration. I don't think he had a heat stroke. He states that he drinks a lot of water.   3. R/O Hypertrophic Cardiomyopathy: Most recent echo in 2017 was normal concerning LV size and wall thickness Grade 1 diastolic dysfunction. There was no evidence of pulmonary hypertension. I will repeat the echo for changes in LV function, size and shape, along with parameters. I have discussed this with Dr. Sallyanne Thornton on site who is in agreement with this plan,   4. Hypertension: Based upon review of patients recordings, BP's have normal trends. No changes in his medication regimen at this time. Checking BMET.    Current medicines are reviewed at length with the patient today.    Labs/ tests ordered today include: BMET, TSH,  Cardiac monitor.   Phill Myron. West Pugh, ANP, AACC   12/09/2018 11:41 AM    [place]  [address] Office [phone] Fax [phone]

## 2018-12-09 ENCOUNTER — Other Ambulatory Visit: Payer: Self-pay

## 2018-12-09 ENCOUNTER — Encounter: Payer: Self-pay | Admitting: Adult Health

## 2018-12-09 ENCOUNTER — Ambulatory Visit (INDEPENDENT_AMBULATORY_CARE_PROVIDER_SITE_OTHER): Payer: Medicare PPO | Admitting: Adult Health

## 2018-12-09 ENCOUNTER — Telehealth: Payer: Self-pay | Admitting: *Deleted

## 2018-12-09 VITALS — BP 126/64 | HR 74 | Ht 73.0 in | Wt 192.8 lb

## 2018-12-09 DIAGNOSIS — R001 Bradycardia, unspecified: Secondary | ICD-10-CM

## 2018-12-09 DIAGNOSIS — I10 Essential (primary) hypertension: Secondary | ICD-10-CM

## 2018-12-09 DIAGNOSIS — I499 Cardiac arrhythmia, unspecified: Secondary | ICD-10-CM | POA: Diagnosis not present

## 2018-12-09 DIAGNOSIS — I519 Heart disease, unspecified: Secondary | ICD-10-CM | POA: Diagnosis not present

## 2018-12-09 NOTE — Progress Notes (Signed)
Thanks

## 2018-12-09 NOTE — Telephone Encounter (Signed)
7 day ZIO XT long term holter monitor to be mailed to the patients home.  Instructions reviewed briefly as they are included in the monitor kit. 

## 2018-12-09 NOTE — Patient Instructions (Signed)
Medication Instructions:  Continue current medications  If you need a refill on your cardiac medications before your next appointment, please call your pharmacy.  Labwork: BMP HERE IN OUR OFFICE AT [facility]   You will NOT need to fast   Take the provided lab slips with you to the lab for your blood draw.   When you have your labs (blood work) drawn today and your tests are completely normal, you will receive your results only by MyChart Message (if you have MyChart) -OR-  A paper copy in the mail.  If you have any lab test that is abnormal or we need to change your treatment, we will call you to review these results.  Testing/Procedures: Your physician has requested that you have an echocardiogram. Echocardiography is a painless test that uses sound waves to create images of your heart. It provides your doctor with information about the size and shape of your heart and how well your heart's chambers and valves are working. This procedure takes approximately one hour. There are no restrictions for this procedure.  Your physician has recommended that you wear a 7 days Zio Patch monitor. Holter monitors are medical devices that record the heart's electrical activity. Doctors most often use these monitors to diagnose arrhythmias. Arrhythmias are problems with the speed or rhythm of the heartbeat. The monitor is a small, portable device. You can wear one while you do your normal daily activities. This is usually used to diagnose what is causing palpitations/syncope (passing out).  Follow-Up: . Your physician recommends that you schedule a follow-up appointment in: 4-6 weeks with Dr Sallyanne Kuster   At [facility], you and your health needs are our priority.  As part of our continuing mission to provide you with exceptional heart care, we have created designated Provider Care Teams.  These Care Teams include your primary Cardiologist (physician) and Advanced Practice Providers (APPs -  Physician Assistants  and Nurse Practitioners) who all work together to provide you with the care you need, when you need it.  Thank you for choosing [facility] at [facility]!!

## 2018-12-10 ENCOUNTER — Ambulatory Visit (HOSPITAL_COMMUNITY): Payer: Medicare PPO | Attending: Internal Medicine

## 2018-12-10 DIAGNOSIS — I519 Heart disease, unspecified: Secondary | ICD-10-CM | POA: Insufficient documentation

## 2018-12-10 DIAGNOSIS — I10 Essential (primary) hypertension: Secondary | ICD-10-CM | POA: Diagnosis present

## 2018-12-10 LAB — BASIC METABOLIC PANEL
BUN/Creatinine Ratio: 25 — ABNORMAL HIGH (ref 10–24)
BUN: 26 mg/dL (ref 8–27)
CO2: 25 mmol/L (ref 20–29)
Calcium: 10 mg/dL (ref 8.6–10.2)
Chloride: 104 mmol/L (ref 96–106)
Creatinine, Ser: 1.04 mg/dL (ref 0.76–1.27)
GFR calc Af Amer: 81 mL/min/{1.73_m2} (ref >59)
GFR calc non Af Amer: 70 mL/min/{1.73_m2} (ref >59)
Glucose: 90 mg/dL (ref 65–99)
Potassium: 4.8 mmol/L (ref 3.5–5.2)
Sodium: 142 mmol/L (ref 134–144)

## 2018-12-11 ENCOUNTER — Encounter: Payer: Self-pay | Admitting: Adult Health

## 2018-12-16 ENCOUNTER — Ambulatory Visit: Payer: Medicare PPO | Admitting: *Deleted

## 2018-12-16 ENCOUNTER — Other Ambulatory Visit: Payer: Self-pay

## 2018-12-16 VITALS — Temp 97.1°F | Ht 73.0 in | Wt 192.4 lb

## 2018-12-16 DIAGNOSIS — Z1211 Encounter for screening for malignant neoplasm of colon: Secondary | ICD-10-CM

## 2018-12-16 MED ORDER — PEG 3350-KCL-NA BICARB-NACL 420 G PO SOLR: 4000.0000 mL | Freq: Once | ORAL | 0 refills | Status: AC

## 2018-12-17 ENCOUNTER — Encounter: Payer: Self-pay | Admitting: Gastroenterology

## 2018-12-28 ENCOUNTER — Telehealth: Payer: Self-pay | Admitting: Internal Medicine

## 2018-12-28 ENCOUNTER — Telehealth: Payer: Self-pay

## 2018-12-28 NOTE — Telephone Encounter (Signed)
Covid-19 screening questions   Do you now or have you had a fever in the last 14 days? NO   Do you have any respiratory symptoms of shortness of breath or cough now or in the last 14 days? NO  Do you have any family members or close contacts with diagnosed or suspected Covid-19 in the past 14 days? NO  Have you been tested for Covid-19 and found to be positive? NO        

## 2018-12-28 NOTE — Telephone Encounter (Signed)
Patient feeling woozy with Golytely prep Says he read package insert which says not to take dulcolax with Golytely  Feelso ok now but no stool after dulcolax and some Golytely so far  After exrtensive discussion and recommending that he stop prep and reschedule he said he wanted to try and would try to complete and see how he does

## 2018-12-29 ENCOUNTER — Ambulatory Visit (AMBULATORY_SURGERY_CENTER): Payer: Medicare PPO | Admitting: Gastroenterology

## 2018-12-29 ENCOUNTER — Encounter: Payer: Self-pay | Admitting: Gastroenterology

## 2018-12-29 ENCOUNTER — Other Ambulatory Visit: Payer: Self-pay

## 2018-12-29 VITALS — BP 102/52 | HR 55 | Temp 98.6°F | Resp 16 | Ht 73.0 in | Wt 192.0 lb

## 2018-12-29 DIAGNOSIS — K649 Unspecified hemorrhoids: Secondary | ICD-10-CM

## 2018-12-29 DIAGNOSIS — Z1211 Encounter for screening for malignant neoplasm of colon: Secondary | ICD-10-CM

## 2018-12-29 MED ORDER — SODIUM CHLORIDE 0.9 % IV SOLN: 500.0000 mL | Freq: Once | INTRAVENOUS | Status: DC

## 2018-12-29 NOTE — Patient Instructions (Signed)
YOU HAD AN ENDOSCOPIC PROCEDURE TODAY AT [facility]:   Refer to the procedure report that was given to you for any specific questions about what was found during the examination.  If the procedure report does not answer your questions, please call your gastroenterologist to clarify.  If you requested that your care partner not be given the details of your procedure findings, then the procedure report has been included in a sealed envelope for you to review at your convenience later.  YOU SHOULD EXPECT: Some feelings of bloating in the abdomen. Passage of more gas than usual.  Walking can help get rid of the air that was put into your GI tract during the procedure and reduce the bloating. If you had a lower endoscopy (such as a colonoscopy or flexible sigmoidoscopy) you may notice spotting of blood in your stool or on the toilet paper. If you underwent a bowel prep for your procedure, you may not have a normal bowel movement for a few days.  Please Note:  You might notice some irritation and congestion in your nose or some drainage.  This is from the oxygen used during your procedure.  There is no need for concern and it should clear up in a day or so.  SYMPTOMS TO REPORT IMMEDIATELY:   Following lower endoscopy (colonoscopy or flexible sigmoidoscopy):  Excessive amounts of blood in the stool  Significant tenderness or worsening of abdominal pains  Swelling of the abdomen that is new, acute  Fever of 100F or higher   For urgent or emergent issues, a gastroenterologist can be reached at any hour by calling [phone].   DIET:  We do recommend a small meal at first, but then you may proceed to your regular diet.  Drink plenty of fluids but you should avoid alcoholic beverages for 24 hours.  ACTIVITY:  You should plan to take it easy for the rest of today and you should NOT DRIVE or use heavy machinery until tomorrow (because of the sedation medicines used during the test).     FOLLOW UP: Our staff will call the number listed on your records 48-72 hours following your procedure to check on you and address any questions or concerns that you may have regarding the information given to you following your procedure. If we do not reach you, we will leave a message.  We will attempt to reach you two times.  During this call, we will ask if you have developed any symptoms of COVID 19. If you develop any symptoms (ie: fever, flu-like symptoms, shortness of breath, cough etc.) before then, please call [phone].  If you test positive for Covid 19 in the 2 weeks post procedure, please call and report this information to [place].    If any biopsies were taken you will be contacted by phone or by letter within the next 1-3 weeks.  Please call us at [phone] if you have not heard about the biopsies in 3 weeks.    SIGNATURES/CONFIDENTIALITY: You and/or your care partner have signed paperwork which will be entered into your electronic medical record.  These signatures attest to the fact that that the information above on your After Visit Summary has been reviewed and is understood.  Full responsibility of the confidentiality of this discharge information lies with you and/or your care-partner.    Handout was given to your care partner on Hemorrhoids. You may resume your current medications today. No further screening colonoscopy needed.  These types  of test generally stop around age 47-80. Please call if any questions or concerns.

## 2018-12-29 NOTE — Progress Notes (Signed)
Pt's states no medical or surgical changes since previsit or office visit. 

## 2018-12-29 NOTE — Progress Notes (Signed)
No problems noted in the recovery room. maw 

## 2018-12-29 NOTE — Op Note (Signed)
[place] [name] Procedure Date: 12/29/2018 2:34 PM MRN: [id number] Endoscopist: Milus Banister , MD Age: 73 Referring MD:  Date of Birth: 07/11/1945 Gender: Male Account #: [id number] Procedure:                Colonoscopy Indications:              Screening for colorectal malignant neoplasm Medicines:                Monitored Anesthesia Care Procedure:                Pre-Anesthesia Assessment:                           - Prior to the procedure, a History and Physical                            was performed, and patient medications and                            allergies were reviewed. The patient's tolerance of                            previous anesthesia was also reviewed. The risks                            and benefits of the procedure and the sedation                            options and risks were discussed with the patient.                            All questions were answered, and informed consent                            was obtained. Prior Anticoagulants: The patient has                            taken no previous anticoagulant or antiplatelet                            agents. ASA Grade Assessment: II - A patient with                            mild systemic disease. After reviewing the risks                            and benefits, the patient was deemed in                            satisfactory condition to undergo the procedure.                           After obtaining informed consent, the colonoscope  was passed under direct vision. Throughout the                            procedure, the patient's blood pressure, pulse, and                            oxygen saturations were monitored continuously. The                            Colonoscope was introduced through the anus and                            advanced to the the cecum, identified by                            appendiceal orifice and  ileocecal valve. The                            colonoscopy was performed without difficulty. The                            patient tolerated the procedure well. The quality                            of the bowel preparation was good. The ileocecal                            valve, appendiceal orifice, and rectum were                            photographed. Scope In: 2:47:42 PM Scope Out: 2:59:40 PM Scope Withdrawal Time: 0 hours 6 minutes 30 seconds  Total Procedure Duration: 0 hours 11 minutes 58 seconds  Findings:                 Internal hemorrhoids were found. The hemorrhoids                            were medium-sized.                           The exam was otherwise without abnormality on                            direct and retroflexion views. Complications:            No immediate complications. Estimated blood loss:                            None. Estimated Blood Loss:     Estimated blood loss: none. Impression:               - Internal hemorrhoids.                           - The examination was otherwise normal on direct  and retroflexion views.                           - No polyps or cancers Recommendation:           - Patient has a contact number available for                            emergencies. The signs and symptoms of potential                            delayed complications were discussed with the                            patient. Return to normal activities tomorrow.                            Written discharge instructions were provided to the                            patient.                           - Resume previous diet.                           - Continue present medications.                           - You do not need any further colon cancer                            screening tests (including stool testing). These                            types of tests generally stop around age 91-80. Milus Banister,  MD 12/29/2018 3:02:18 PM This report has been signed electronically.

## 2018-12-29 NOTE — Progress Notes (Signed)
To PACU, VSS. Report to [email or web address] 

## 2018-12-31 ENCOUNTER — Encounter: Payer: Self-pay | Admitting: Adult Health

## 2018-12-31 ENCOUNTER — Telehealth: Payer: Self-pay | Admitting: *Deleted

## 2018-12-31 NOTE — Telephone Encounter (Signed)
1. Have you developed a fever since your procedure? no  2.   Have you had an respiratory symptoms (SOB or cough) since your procedure? no  3.   Have you tested positive for COVID 19 since your procedure no  4.   Have you had any family members/close contacts diagnosed with the COVID 19 since your procedure?  no   If yes to any of these questions please route to Joylene John, RN and Alphonsa Gin, Therapist, sports.     Follow up Call-  Call back number 12/29/2018  Post procedure Call Back phone  # [phone]  Permission to leave phone message No  Some recent data might be hidden     Patient questions:  Do you have a fever, pain , or abdominal swelling? No. Pain Score  0 *  Have you tolerated food without any problems? Yes.    Have you been able to return to your normal activities? Yes.    Do you have any questions about your discharge instructions: Diet   No. Medications  No. Follow up visit  No.  Do you have questions or concerns about your Care? No.  Actions: * If pain score is 4 or above: No action needed, pain <4.

## 2019-01-07 ENCOUNTER — Ambulatory Visit (INDEPENDENT_AMBULATORY_CARE_PROVIDER_SITE_OTHER): Payer: Medicare PPO

## 2019-01-07 DIAGNOSIS — I499 Cardiac arrhythmia, unspecified: Secondary | ICD-10-CM | POA: Diagnosis not present

## 2019-01-07 DIAGNOSIS — R001 Bradycardia, unspecified: Secondary | ICD-10-CM | POA: Diagnosis not present

## 2019-01-11 ENCOUNTER — Ambulatory Visit: Payer: Medicare PPO | Admitting: Physician Assistant

## 2019-01-28 ENCOUNTER — Ambulatory Visit: Payer: Medicare PPO | Admitting: Cardiovascular Disease

## 2019-01-28 ENCOUNTER — Other Ambulatory Visit: Payer: Self-pay

## 2019-01-28 ENCOUNTER — Encounter: Payer: Self-pay | Admitting: Cardiovascular Disease

## 2019-01-28 DIAGNOSIS — E859 Amyloidosis, unspecified: Secondary | ICD-10-CM

## 2019-01-28 NOTE — Patient Instructions (Signed)
Medication Instructions:  No changes *If you need a refill on your cardiac medications before your next appointment, please call your pharmacy*  Lab Work: None ordered If you have labs (blood work) drawn today and your tests are completely normal, you will receive your results only by: Marland Kitchen MyChart Message (if you have MyChart) OR . A paper copy in the mail If you have any lab test that is abnormal or we need to change your treatment, we will call you to review the results.  Testing/Procedures:  Myocardial Amyloid Planar and Spect Scan - Your physician has requested that you have a scan to evaluate for cardiac amyloidosis. This test "outlines" the heart to determine if you have any amyloid protein deposits located in your heart. This will be performed here at our [place] location. The test will take approximately 2 hours to complete. Please do not wear any cologne or fragrances.    Follow-Up: At [facility], you and your health needs are our priority.  As part of our continuing mission to provide you with exceptional heart care, we have created designated Provider Care Teams.  These Care Teams include your primary Cardiologist (physician) and Advanced Practice Providers (APPs -  Physician Assistants and Nurse Practitioners) who all work together to provide you with the care you need, when you need it.  Your next appointment:   12 months  The format for your next appointment:   In Person  Provider:   Sanda Klein, MD

## 2019-01-28 NOTE — Progress Notes (Signed)
Cardiology Office Note    Date:  02/04/2019   ID:  Marc Thornton, DOB 1945/08/24, MRN [id number]  PCP:  [name], NP  Cardiologist:   Sanda Klein, MD   Chief complaint: Follow-up cardiomyopathy, recent echocardiogram and arrhythmia monitor   History of Present Illness:  Marc Thornton is a 73 y.o. male with some abnormalities on his echocardiogram suspicious for possible hypertrophic or infiltrative cardiomyopathy, work-up triggered by abnormal ECG.  He is generally doing well and has no somatic complaints.  He is mourning the passing of his wife who died about 2 months ago after a long struggle with progressive suprabulbar palsy.  He had some problems with lightheadedness in late August and saw one of our nurse practitioners.  He was scheduled for an echocardiogram and event monitor.  He self-reported bradycardia with heart rates in the 50s.  A follow-up echocardiogram in September shows no meaningful change from 2017.  His event monitor shows frequent PVCs and occasional brief paroxysmal atrial tachycardia but no evidence of VT or atrial fibrillation or any significant bradycardia.  The nuclear perfusion images were normal. He was only able to exercise for 4-1/2 minutes (5.6 METS) before he reached a heart rate of 149 bpm. He had a hypertensive response to exercise with a diastolic blood pressure that increased to 107 mmHg. Exercise performance was good. His echocardiogram did not show evidence of hypertrophic obstructive cardiomyopathy or valvular aortic stenosis. Left ventricular ejection fraction was normal. There was indeed evidence of diastolic dysfunction. The mitral annulus diastolic velocities were markedly reduced and the E/e' ratio was borderline significant for elevated filling pressures. Surprisingly, the measurements did not show thickening of the ventricular walls or left atrial enlargement.  He spent a year and a half in [place] in the [employer] and had some exposure to  [employer]. He quit smoking 50 years ago.   There is a strong family history of cerebrovascular disease and dementia on his father's side. His paternal grandfather died at age 3 of a stroke. His father had triple bypass at age 63 and lived to be 69, after having a stroke at age 70. His mother died at age 27 from metastatic breast cancer. No family history of hypertrophic cardiomyopathy, congestive heart failure, unexplained sudden cardiac death or her ventricular arrhythmia. He believes his father had amyloid angiopathy and whether this might be hereditary.  His ECG today shows sinus rhythm with very prominent changes of left ventricular hypertrophy and profound secondary repolarization abnormalities. There is also an unusually sudden transition to positive R waves in lead V2 and deep Q waves in lead aVL only. He reports having had electrocardiograms before, that were by his recollection all normal. He has never been told that he had an abnormal electrocardiogram.  Repeat echocardiogram performed in September 3 shows very similar findings to the study from 2017.  There is no convincing evidence of left ventricular hypertrophy but there is evidence of diastolic dysfunction.  Filling pressures were indeterminate.  An ECG performed roughly a month ago shows sinus rhythm with the same prominent changes of high voltage in virtually all leads and secondary repolarization abnormalities.  He has very early transition to tall positive R waves in lead V2 and has deep Q waves in lead aVL.  It is quite similar to the ECG performed in October 2017.   Past Medical History:  Diagnosis Date  . Arthritis   . BPH (benign prostatic hyperplasia)   . Cardiomyopathy ([place])   .  Chicken pox   . 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 DDD (degenerative disc disease), cervical   . Depression   . Elevated PSA   . Fracture of foot bone, right, closed 1964  . GERD (gastroesophageal reflux disease)   . Heart murmur    . Hyperlipidemia   . Polyneuropathy   . Rheumatic fever   . Seasonal allergies     Past Surgical History:  Procedure Laterality Date  . COLONOSCOPY     2006 normal exam  . PILONIDAL CYST EXCISION  1966  . WISDOM TOOTH EXTRACTION      Current Medications: Outpatient Medications Prior to Visit  Medication Sig Dispense Refill  . Betaine, Trimethylglycine, (TMG, TRIMETHYLGLYCINE, PO)     . acetaminophen (TYLENOL) 500 MG tablet Take 500 mg by mouth every 6 (six) hours as needed.    . Cholecalciferol (VITAMIN D3) 25 MCG (1000 UT) CAPS Take by mouth. Takes 3-4 times a week    . Multiple Vitamins-Minerals (CENTRUM MEN PO) Take by mouth. Takes 3-4 times a week    . UNABLE TO FIND Solgar homocystine moduator, takes before exercise     No facility-administered medications prior to visit.      Allergies:   Patient has no known allergies.   Social History   Socioeconomic History  . Marital status: Widowed    Spouse name: Not on file  . Number of children: Not on file  . Years of education: Not on file  . Highest education level: Not on file  Occupational History  . Not on file  Social Needs  . Financial resource strain: Not on file  . Food insecurity    Worry: Not on file    Inability: Not on file  . Transportation needs    Medical: Not on file    Non-medical: Not on file  Tobacco Use  . Smoking status: Former Research scientist (life sciences)  . Smokeless tobacco: Former Network engineer and Sexual Activity  . Alcohol use: Yes    Comment: occ.  . Drug use: No  . Sexual activity: Not on file  Lifestyle  . Physical activity    Days per week: Not on file    Minutes per session: Not on file  . Stress: Not on file  Relationships  . Social Herbalist on phone: Not on file    Gets together: Not on file    Attends religious service: Not on file    Active member of club or organization: Not on file    Attends meetings of clubs or organizations: Not on file    Relationship status: Not on  file  Other Topics Concern  . Not on file  Social History Narrative   Worked as a Community education officer    Married        Family History:  The patient's family history includes Arthritis in his father and mother; Breast cancer in his mother; Dementia in his father; Heart disease in his father; Hypertension in his father; Prostate cancer in his father; Stroke in his father and paternal grandfather.   ROS:   Please see the history of present illness.    ROS All other systems reviewed and are negative.   PHYSICAL EXAM:   VS:  BP 124/72   Pulse 63   Ht 6' (1.829 m)   Wt 195 lb (88.5 kg)   SpO2 96%   BMI 26.45 kg/m     General: Alert, oriented x3, no distress,  appears well Head: no evidence of trauma, PERRL, EOMI, no exophtalmos or lid lag, no myxedema, no xanthelasma; normal ears, nose and oropharynx Neck: normal jugular venous pulsations and no hepatojugular reflux; brisk carotid pulses without delay and no carotid bruits Chest: clear to auscultation, no signs of consolidation by percussion or palpation, normal fremitus, symmetrical and full respiratory excursions Cardiovascular: normal position and quality of the apical impulse, regular rhythm, normal first and second heart sounds, no murmurs, rubs or gallops Abdomen: no tenderness or distention, no masses by palpation, no abnormal pulsatility or arterial bruits, normal bowel sounds, no hepatosplenomegaly Extremities: no clubbing, cyanosis or edema; 2+ radial, ulnar and brachial pulses bilaterally; 2+ right femoral, posterior tibial and dorsalis pedis pulses; 2+ left femoral, posterior tibial and dorsalis pedis pulses; no subclavian or femoral bruits Neurological: grossly nonfocal Psych: Normal mood and affect   Wt Readings from Last 3 Encounters:  01/28/19 195 lb (88.5 kg)  12/29/18 192 lb (87.1 kg)  12/16/18 192 lb 6.4 oz (87.3 kg)      Studies/Labs Reviewed:   ECHO 12/10/2018:  1. The left ventricle has hyperdynamic systolic  function, with an ejection fraction of >65%. The cavity size was normal. Left ventricular diastolic Doppler parameters are consistent with impaired relaxation. Indeterminate filling pressures The E/e' is  8-15. No evidence of left ventricular regional wall motion abnormalities.  2. The right ventricle has normal systolic function. The cavity was normal. There is no increase in right ventricular wall thickness.  3. The mitral valve is grossly normal.  4. The aortic valve was not well visualized. No stenosis of the aortic valve.  5. The aorta is normal unless otherwise noted.  6. The interatrial septum was not well visualized.  Event monitor 01/28/2019: Abnormal event monitor due to the presence of frequent PVCs and occasional brief runs of nonsustained atrial tachycardia.   There is no evidence of significant bradycardia, ventricular tachycardia or atrial fibrillation.  EKG:  EKG is not ordered today.  Electrocardiogram from December 10, 2018 shows normal sinus rhythm, very significant high voltage in multiple leads, tall R wave in lead V2, deep broad Q wave in lead aVL and prominent diffuse ST segment depression T wave inversion. LABS: [facility] 10/20/2015 Cholesterol 178, triglycerides 303, HDL 43, LDL 74, hemoglobin A1c 5.4%    ASSESSMENT:    1. 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 CHF: He is not particularly symptomatic at this time, but his previous treadmill stress test showed that his exercise capacity is more limited than he acknowledges.  His problems with dyspnea most likely represents diastolic heart failure related to diastolic dysfunction. . I have told him that typically with amyloidosis the electrocardiogram shows low voltage, and that his ECG shows the typical high-voltage of left ventricular hypertrophy. I think it is more likely that he has high blood pressure heart changes or even a primary hypertrophic  cardiomyopathy.  He remains concerned about the possibility of amyloidosis.  Schedule him for a PYP scan.  Ultimately a cardiac MRI will be more revealing, but he does not want to have any type of intravenous contrast. 2. Abnormal ECG: Looks like left ventricular hypertrophy, but this was not confirmed on echocardiography.  He has a completely normal blood pressure at rest.  He does not have resting hypertension, but does have a hypertensive response to exercise. 3. Murmur: Not particularly prominent today.  Could be due just to aortic  valve sclerosis.  There was no evidence of left ventricular outflow tract obstruction or systolic anterior motion of the mitral valve on his echocardiogram.  4. HyperTG: In the absence of diabetes mellitus or known vascular disease, his mild-moderate hypertriglyceridemia does not require pharmacological therapy. He should limit his intake of sugars and starches and saturated fats and perform daily physical exercise. You exercise prescription after we have the results of his echo. 5. Possible amyloidosis: Pointed out the fact that there is no relationship between cerebral amyloid angiopathy which is an isolated cerebrovascular phenomenon and systemic amyloidosis. Therefore his father's medical problems likely have little bearing on his current condition. 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    Medication Adjustments/Labs and Tests Ordered: Current medicines are reviewed at length with the patient today.  Concerns regarding medicines are outlined above.  Medication changes, Labs and Tests ordered today are listed in the Patient Instructions below. Patient Instructions  Medication Instructions:  No changes *If you need a refill on your cardiac medications before your next appointment, please call your pharmacy*  Lab Work: None  ordered If you have labs (blood work) drawn today and your tests are completely normal, you will receive your results only by: Marland Kitchen MyChart Message (if you have MyChart) OR . A paper copy in the mail If you have any lab test that is abnormal or we need to change your treatment, we will call you to review the results.  Testing/Procedures:  Myocardial Amyloid Planar and Spect Scan - Your physician has requested that you have a scan to evaluate for cardiac amyloidosis. This test "outlines" the heart to determine if you have any amyloid protein deposits located in your heart. This will be performed here at our [place] location. The test will take approximately 2 hours to complete. Please do not wear any cologne or fragrances.    Follow-Up: At [facility], you and your health needs are our priority.  As part of our continuing mission to provide you with exceptional heart care, we have created designated Provider Care Teams.  These Care Teams include your primary Cardiologist (physician) and Advanced Practice Providers (APPs -  Physician Assistants and Nurse Practitioners) who all work together to provide you with the care you need, when you need it.  Your next appointment:   12 months  The format for your next appointment:   In Person  Provider:   Sanda Klein, MD      Signed, Sanda Klein, MD  02/04/2019 11:55 AM    [address], [place], [place]  [address] Phone: [phone]; Fax: [phone]

## 2019-02-09 ENCOUNTER — Telehealth (HOSPITAL_COMMUNITY): Payer: Self-pay

## 2019-02-09 NOTE — Telephone Encounter (Signed)
Encounter complete. 

## 2019-02-11 ENCOUNTER — Ambulatory Visit (HOSPITAL_COMMUNITY)
Admission: RE | Admit: 2019-02-11 | Discharge: 2019-02-11 | Disposition: A | Discharge status: Home / Self Care | Payer: Medicare PPO | Source: Ambulatory Visit | Attending: Cardiology | Admitting: Cardiology

## 2019-02-11 ENCOUNTER — Other Ambulatory Visit: Payer: Self-pay

## 2019-02-11 DIAGNOSIS — E859 Amyloidosis, unspecified: Secondary | ICD-10-CM

## 2019-02-11 MED ORDER — TECHNETIUM TC 99M PYROPHOSPHATE
21.0000 | Freq: Once | INTRAVENOUS | Status: AC
  Administered 2019-02-11: 21 via INTRAVENOUS

## 2019-03-28 ENCOUNTER — Encounter: Payer: Self-pay | Admitting: Adult Health

## 2019-07-06 ENCOUNTER — Other Ambulatory Visit: Payer: Self-pay

## 2019-07-07 ENCOUNTER — Encounter: Payer: Self-pay | Admitting: Adult Health

## 2019-07-07 ENCOUNTER — Ambulatory Visit (INDEPENDENT_AMBULATORY_CARE_PROVIDER_SITE_OTHER): Payer: Medicare PPO | Admitting: Adult Health

## 2019-07-07 VITALS — BP 132/84 | Temp 97.7°F | Wt 197.0 lb

## 2019-07-07 DIAGNOSIS — D696 Thrombocytopenia, unspecified: Secondary | ICD-10-CM | POA: Diagnosis not present

## 2019-07-07 DIAGNOSIS — R972 Elevated prostate specific antigen [PSA]: Secondary | ICD-10-CM

## 2019-07-07 DIAGNOSIS — Z9189 Other specified personal risk factors, not elsewhere classified: Secondary | ICD-10-CM

## 2019-07-07 DIAGNOSIS — G6289 Other specified polyneuropathies: Secondary | ICD-10-CM

## 2019-07-07 LAB — CBC WITH DIFFERENTIAL/PLATELET
Basophils Absolute: 0.1 10*3/uL (ref 0.0–0.1)
Basophils Relative: 1.1 % (ref 0.0–3.0)
Eosinophils Absolute: 0.4 10*3/uL (ref 0.0–0.7)
Eosinophils Relative: 5.8 % — ABNORMAL HIGH (ref 0.0–5.0)
HCT: 45.8 % (ref 39.0–52.0)
Hemoglobin: 15.4 g/dL (ref 13.0–17.0)
Lymphocytes Relative: 46.3 % — ABNORMAL HIGH (ref 12.0–46.0)
Lymphs Abs: 2.8 10*3/uL (ref 0.7–4.0)
MCHC: 33.5 g/dL (ref 30.0–36.0)
MCV: 93.6 fl (ref 78.0–100.0)
Monocytes Absolute: 0.5 10*3/uL (ref 0.1–1.0)
Monocytes Relative: 7.7 % (ref 3.0–12.0)
Neutro Abs: 2.4 10*3/uL (ref 1.4–7.7)
Neutrophils Relative %: 39.1 % — ABNORMAL LOW (ref 43.0–77.0)
Platelets: 148 10*3/uL — ABNORMAL LOW (ref 150.0–400.0)
RBC: 4.89 Mil/uL (ref 4.22–5.81)
RDW: 13.5 % (ref 11.5–15.5)
WBC: 6.1 10*3/uL (ref 4.0–10.5)

## 2019-07-07 LAB — BASIC METABOLIC PANEL
BUN: 21 mg/dL (ref 6–23)
CO2: 31 mEq/L (ref 19–32)
Calcium: 9.5 mg/dL (ref 8.4–10.5)
Chloride: 103 mEq/L (ref 96–112)
Creatinine, Ser: 0.95 mg/dL (ref 0.40–1.50)
GFR: 77.24 mL/min (ref >60.00)
Glucose, Bld: 83 mg/dL (ref 70–99)
Potassium: 3.9 mEq/L (ref 3.5–5.1)
Sodium: 139 mEq/L (ref 135–145)

## 2019-07-07 LAB — LIPID PANEL
Cholesterol: 167 mg/dL (ref 0–200)
HDL: 37.9 mg/dL — ABNORMAL LOW (ref >39.00)
LDL Cholesterol: 94 mg/dL (ref 0–99)
NonHDL: 128.75
Total CHOL/HDL Ratio: 4
Triglycerides: 175 mg/dL — ABNORMAL HIGH (ref 0.0–149.0)
VLDL: 35 mg/dL (ref 0.0–40.0)

## 2019-07-07 LAB — PSA: PSA: 3.55 ng/mL (ref 0.10–4.00)

## 2019-07-07 NOTE — Patient Instructions (Signed)
We will follow up with you regarding your labs   Neurology will call you to schedule your appointment with them

## 2019-07-07 NOTE — Progress Notes (Signed)
Subjective:    Patient ID: Marc Thornton, male    DOB: [birth year], 74 y.o.   MRN: [id number]  HPI   74 year old male who  has a past medical history of Arthritis, BPH (benign prostatic hyperplasia), Cardiomyopathy ([place]), Chicken pox, Clotting disorder ([place]), DDD (degenerative disc disease), cervical, Depression, Elevated PSA, Fracture of foot bone, right, closed (1964), GERD (gastroesophageal reflux disease), Heart murmur, Hyperlipidemia, Polyneuropathy, Rheumatic fever, and Seasonal allergies.  He presents to the office today for multiple issues  Peripheral Neuropathy -this has been an ongoing issue for many years at this point.  He has been worked up at the [facility] in the past and reports having the EMG done approximately 5 years ago.  He was supposed to follow-up again during the Covid pandemic but failed to do so because he is not really happy with the treatment that he has received through the [facility].  He would like to be seen by a neurologist within the [facility] system.  Feels as though the peripheral neuropathy has been slowly progressing.  He is currently waking him up from a sleep.  He reports feeling pins-and-needles in bilateral forearms, hands, and feet.  He reports that he has never been prescribed any medication and that "the [facility] [place] is just monitoring me".  Additionally he would like some blood work done today.  Back in August 2021 he did his routine physical his platelet count was slightly low at 139 and his PSA was slightly elevated at 4.22.  At this time he was advised to follow-up in a month for recheck but failed to do so.  He would like these checked today.  He would also like his lipid panel and glucose checked despite these being within normal range in August.  Per patient he reports that his appetite has been poor he has been mostly eating cereal for his meals.  He is trying to eat healthier as of lately.  He does report that he is walking approximately 2 miles daily with his dog.  Review of  Systems See HPI   Past Medical History:  Diagnosis Date  . Arthritis   . BPH (benign prostatic hyperplasia)   . Cardiomyopathy ([place])   . Chicken pox   . 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 DDD (degenerative disc disease), cervical   . Depression   . Elevated PSA   . Fracture of foot bone, right, closed 1964  . GERD (gastroesophageal reflux disease)   . Heart murmur   . Hyperlipidemia   . Polyneuropathy   . Rheumatic fever   . Seasonal allergies     Social History   Socioeconomic History  . Marital status: Widowed    Spouse name: Not on file  . Number of children: Not on file  . Years of education: Not on file  . Highest education level: Not on file  Occupational History  . Not on file  Tobacco Use  . Smoking status: Former Research scientist (life sciences)  . Smokeless tobacco: Former Network engineer and Sexual Activity  . Alcohol use: Yes    Comment: occ.  . Drug use: No  . Sexual activity: Not on file  Other Topics Concern  . Not on file  Social History Narrative   Worked as a Community education officer    Married      Social Determinants of Radio broadcast assistant Strain:   . Difficulty of Paying Living Expenses:   Food Insecurity:   . Worried About  Running Out of Food in the Last Year:   . [place] in the Last Year:   Transportation Needs:   . Lack of Transportation (Medical):   Marland Kitchen Lack of Transportation (Non-Medical):   Physical Activity:   . Days of Exercise per Week:   . Minutes of Exercise per Session:   Stress:   . Feeling of Stress :   Social Connections:   . Frequency of Communication with Friends and Family:   . Frequency of Social Gatherings with Friends and Family:   . Attends Religious Services:   . Active Member of Clubs or Organizations:   . Attends Archivist Meetings:   Marland Kitchen Marital Status:   Intimate Partner Violence:   . Fear of Current or Ex-Partner:   . Emotionally Abused:   Marland Kitchen Physically Abused:   . Sexually Abused:     Past Surgical  History:  Procedure Laterality Date  . COLONOSCOPY     2006 normal exam  . PILONIDAL CYST EXCISION  1966  . WISDOM TOOTH EXTRACTION      Family History  Problem Relation Age of Onset  . Arthritis Mother   . Breast cancer Mother   . Arthritis Father   . Heart disease Father   . Stroke Father   . Hypertension Father   . Prostate cancer Father   . Dementia Father   . Stroke Paternal Grandfather   . Colon cancer Neg Hx   . Esophageal cancer Neg Hx   . Stomach cancer Neg Hx   . Rectal cancer Neg Hx     No Known Allergies  Current Outpatient Medications on File Prior to Visit  Medication Sig Dispense Refill  . acetaminophen (TYLENOL) 500 MG tablet Take 500 mg by mouth every 6 (six) hours as needed.    . Betaine, Trimethylglycine, (TMG, TRIMETHYLGLYCINE, PO)     . Cholecalciferol (VITAMIN D3) 25 MCG (1000 UT) CAPS Take by mouth. Takes 3-4 times a week    . Multiple Vitamins-Minerals (CENTRUM MEN PO) Take by mouth. Takes 3-4 times a week    . UNABLE TO FIND Solgar homocystine moduator, takes before exercise     No current facility-administered medications on file prior to visit.    BP 132/84   Temp 97.7 F (36.5 C) (Temporal)   Wt 197 lb (89.4 kg)   BMI 26.72 kg/m       Objective:   Physical Exam Vitals and nursing note reviewed.  Constitutional:      Appearance: Normal appearance.  Cardiovascular:     Rate and Rhythm: Normal rate and regular rhythm.     Pulses: Normal pulses.     Heart sounds: Murmur (? very mild murmur ) present.  Pulmonary:     Effort: Pulmonary effort is normal.     Breath sounds: Normal breath sounds.  Musculoskeletal:     Right lower leg: Edema present.     Left lower leg: Edema (trace pitting edema) present.  Neurological:     General: No focal deficit present.     Mental Status: He is alert and oriented to person, place, and time.  Psychiatric:        Mood and Affect: Mood normal.        Behavior: Behavior normal.        Thought  Content: Thought content normal.        Judgment: Judgment normal.       Assessment & Plan:  1. Other polyneuropathy -We will  refer to neurology at patient's request.  He was advised to try and get neurology notes from the [facility] off treatment until he has been seen by neurology - Ambulatory referral to Neurology  2. Thrombocytopenia (HCC)  - CBC with Differential/Platelet  3. Elevated PSA - consider referral to Urology - PSA  4. Has poorly balanced diet - Encouraged to change diet and continue to exercise - Lipid panel - Basic Metabolic Panel - CBC with Differential/Platelet  [name], NP

## 2019-07-08 ENCOUNTER — Encounter: Payer: Self-pay | Admitting: Adult Health

## 2019-07-11 ENCOUNTER — Encounter: Payer: Self-pay | Admitting: Adult Health

## 2019-08-17 ENCOUNTER — Telehealth: Payer: Self-pay

## 2019-08-17 NOTE — Telephone Encounter (Signed)
   [facility] Pre-operative Risk Assessment    Request for surgical clearance:  1. What type of surgery is being performed? Robot assisted right inguinal laparoscopic hernia repair  2. When is this surgery scheduled? 10/04/19  3. What type of clearance is required (medical clearance vs. Pharmacy clearance to hold med vs. Both)? Medical   4. Are there any medications that need to be held prior to surgery and how long? None listed   5. Practice name and name of physician performing surgery?  [facility]   6. What is your office phone number [phone]    7.   What is your office fax number [phone]   8.   Anesthesia type (None, local, MAC, general) ? unknown   Marc Thornton 08/17/2019, 7:46 AM  _________________________________________________________________   (provider comments below)

## 2019-08-17 NOTE — Telephone Encounter (Signed)
   Primary Cardiologist: Sanda Klein, MD  Chart reviewed as part of pre-operative protocol coverage. Patient was contacted 08/17/2019 in reference to pre-operative risk assessment for pending surgery as outlined below.  Marc Thornton was last seen on 01/28/19 by Dr. Sallyanne Kuster. H/o PVCs, arthritis, BPH, platelet disorder, DDD, depression, GERD, HLD, diastolic heart failure, polyneuropathy, rheumatic fever. He has history of abnormal EKG and abnormal echo with patient concern for amyloidosis but PYP scan was low risk. Had normal stress test in 2017. RCRI 0.9% indicating low risk of CV complications. He affirms he is doing well and walks the dog 3/4 of a mile to 2 miles daily and works in his garden without any recent chest pain or unusual dyspnea. He does recall an episode 6-8 weeks ago where he got overheated on a hot day working outside and noticed his manual pulse was skipping every 3rd beat briefly. He was not tachycardic. No syncope, pre-syncope, chest pain or dyspnea. His monitor in 01/2019 showed frequent PVCs sometimes in pattern with bigeminy which coincides with this finding. This has not recurred and he's felt fine since that time from cardiac standpoint. He now knows to avoid the heat. I suspect the patient can be cleared for his procedure but will route to Dr. Sallyanne Kuster at patient request. Dr. Sallyanne Kuster - Please route response to P CV DIV PREOP (the pre-op pool). Thank you.  Once I hear back from Dr. Loletha Grayer, will plan to route bundled pre-op clearance to requesting provider. Of note, no blood thinners to hold.   This was a 14 minute phone conversation as patient had many questions about event monitor terms and also had questions about his chronic peripheral neuropathy. I tried my best to help work through the event monitor report but the subsequent questions about his neuropathy were out of the scope of this pre-op encounter. I offered to schedule him a virtual visit with Dr. Sallyanne Kuster to discuss further  but he declined. He plans to send him a MyChart message with his questions.  [name], PA-C 08/17/2019, 10:23 AM

## 2019-08-17 NOTE — Telephone Encounter (Signed)
   Primary Cardiologist: Sanda Klein, MD  Chart revisited as part of pre-operative protocol coverage. Given past medical history and time since last visit, based on ACC/AHA guidelines, Marc Thornton would be at acceptable risk for the planned procedure without further cardiovascular testing.   I will route this recommendation to the requesting party via Epic fax function and remove from pre-op pool.  Please call with questions.  [name], PA-C 08/17/2019, 12:19 PM

## 2019-08-17 NOTE — Telephone Encounter (Signed)
Low risk for CV complications with planned surgery.

## 2019-08-19 ENCOUNTER — Other Ambulatory Visit: Payer: Self-pay

## 2019-08-19 ENCOUNTER — Encounter: Payer: Self-pay | Admitting: Neurology

## 2019-08-19 ENCOUNTER — Ambulatory Visit: Payer: Medicare PPO | Admitting: Neurology

## 2019-08-19 VITALS — BP 134/75 | HR 56 | Temp 97.5°F | Ht 72.0 in | Wt 195.0 lb

## 2019-08-19 DIAGNOSIS — R799 Abnormal finding of blood chemistry, unspecified: Secondary | ICD-10-CM | POA: Diagnosis not present

## 2019-08-19 DIAGNOSIS — E538 Deficiency of other specified B group vitamins: Secondary | ICD-10-CM | POA: Diagnosis not present

## 2019-08-19 DIAGNOSIS — R413 Other amnesia: Secondary | ICD-10-CM | POA: Insufficient documentation

## 2019-08-19 DIAGNOSIS — R269 Unspecified abnormalities of gait and mobility: Secondary | ICD-10-CM | POA: Insufficient documentation

## 2019-08-19 DIAGNOSIS — R7989 Other specified abnormal findings of blood chemistry: Secondary | ICD-10-CM | POA: Diagnosis not present

## 2019-08-19 DIAGNOSIS — M542 Cervicalgia: Secondary | ICD-10-CM | POA: Insufficient documentation

## 2019-08-19 DIAGNOSIS — R202 Paresthesia of skin: Secondary | ICD-10-CM | POA: Diagnosis not present

## 2019-08-19 DIAGNOSIS — E559 Vitamin D deficiency, unspecified: Secondary | ICD-10-CM | POA: Diagnosis not present
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
## 2019-08-23 ENCOUNTER — Encounter: Payer: Self-pay | Admitting: Adult Health

## 2019-08-23 LAB — RPR: RPR Ser Ql: NONREACTIVE

## 2019-08-23 LAB — MULTIPLE MYELOMA PANEL, SERUM
Albumin SerPl Elph-Mcnc: 3.9 g/dL (ref 2.9–4.4)
Albumin/Glob SerPl: 1.2 (ref 0.7–1.7)
Alpha 1: 0.2 g/dL (ref 0.0–0.4)
Alpha2 Glob SerPl Elph-Mcnc: 0.7 g/dL (ref 0.4–1.0)
B-Globulin SerPl Elph-Mcnc: 1.2 g/dL (ref 0.7–1.3)
Gamma Glob SerPl Elph-Mcnc: 1.5 g/dL (ref 0.4–1.8)
Globulin, Total: 3.5 g/dL (ref 2.2–3.9)
IgA/Immunoglobulin A, Serum: 377 mg/dL (ref 61–437)
IgG (Immunoglobin G), Serum: 1550 mg/dL (ref 603–1613)
IgM (Immunoglobulin M), Srm: 92 mg/dL (ref 15–143)
Total Protein: 7.4 g/dL (ref 6.0–8.5)

## 2019-08-23 LAB — VITAMIN D 25 HYDROXY (VIT D DEFICIENCY, FRACTURES): Vit D, 25-Hydroxy: 25.6 ng/mL — ABNORMAL LOW (ref 30.0–100.0)

## 2019-08-23 LAB — C-REACTIVE PROTEIN: CRP: <1 mg/L (ref 0–10)

## 2019-08-23 LAB — SEDIMENTATION RATE: Sed Rate: 16 mm/hr (ref 0–30)

## 2019-08-23 LAB — CK: Total CK: 122 U/L (ref 41–331)

## 2019-08-23 LAB — B. BURGDORFI ANTIBODIES: Lyme IgG/IgM Ab: <0.91 {ISR} (ref 0.00–0.90)

## 2019-08-23 LAB — TSH: TSH: 2.87 u[IU]/mL (ref 0.450–4.500)

## 2019-08-23 LAB — VITAMIN B12: Vitamin B-12: 527 pg/mL (ref 232–1245)

## 2019-08-23 LAB — ANA W/REFLEX IF POSITIVE: Anti Nuclear Antibody (ANA): NEGATIVE

## 2019-08-26 ENCOUNTER — Telehealth: Payer: Self-pay | Admitting: Neurology

## 2019-08-26 NOTE — Telephone Encounter (Signed)
[employer] pending uploaded notes  

## 2019-08-30 NOTE — Telephone Encounter (Signed)
Marc Thornton Marc Thornton: [id number] (exp. 08/26/19 to 09/25/19) order sent to GI. They will reach out tot he patient to schedule.

## 2019-09-13 ENCOUNTER — Other Ambulatory Visit: Payer: Self-pay

## 2019-09-13 ENCOUNTER — Ambulatory Visit (INDEPENDENT_AMBULATORY_CARE_PROVIDER_SITE_OTHER): Payer: Medicare PPO | Admitting: Neurology

## 2019-09-13 ENCOUNTER — Ambulatory Visit: Payer: Medicare PPO | Admitting: Neurology

## 2019-09-13 DIAGNOSIS — G629 Polyneuropathy, unspecified: Secondary | ICD-10-CM | POA: Insufficient documentation

## 2019-09-13 DIAGNOSIS — R269 Unspecified abnormalities of gait and mobility: Secondary | ICD-10-CM | POA: Diagnosis not present

## 2019-09-13 DIAGNOSIS — M542 Cervicalgia: Secondary | ICD-10-CM | POA: Diagnosis not present

## 2019-09-13 DIAGNOSIS — G6289 Other specified polyneuropathies: Secondary | ICD-10-CM

## 2019-09-13 DIAGNOSIS — R202 Paresthesia of skin: Secondary | ICD-10-CM

## 2019-09-13 DIAGNOSIS — M62561 Muscle wasting and atrophy, not elsewhere classified, right lower leg: Secondary | ICD-10-CM | POA: Insufficient documentation

## 2019-09-13 DIAGNOSIS — M5416 Radiculopathy, lumbar region: Secondary | ICD-10-CM | POA: Insufficient documentation
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
## 2019-09-13 NOTE — Procedures (Signed)
Full Name: Marc Thornton Gender: Male MRN #: [id number] Date of Birth: Jan 08, 1946    Visit Date: 09/13/2019 09:47 Age: 75 Years Examining Physician: Marcial Pacas, MD  Referring Physician: Marcial Pacas, MD Height: 6 feet 0 inch Patient History: 35.1 C Feet History:  74 year old male, complains of intermittent chronic low back pain radiating pain to right lower extremity, mild right lower extremity muscle atrophy, frequent calf muscle cramping, bilateral lower extremity paresthesia  Summary of the tests:  Nerve conduction study:  Bilateral sural sensory responses showed moderately decreased snap amplitude.  Bilateral superficial peroneal sensory responses showed normal snap amplitude, but with moderately prolonged peak latency.  Left median sensory response was absent.  Right median sensory response showed moderately prolonged peak latency with significantly decreased snap amplitude.  Left ulnar sensory responses were normal.  Left tibial, peroneal, ulnar motor responses were normal.  Bilateral median motor responses showed moderately prolonged distal latency, with normal CMAP amplitude, conduction velocity.  Left tibial, ulnar F-wave latency was mildly prolonged, but was well formed.  This most likely related to his tall status  Electromyography: Selected needle examination of bilateral lower extremity muscles, bilateral lumbosacral paraspinal muscles, left upper extremity muscles and left cervical paraspinal muscles were performed. There is evidence of chronic neuropathic changes involving bilateral tibialis anterior, posterior, medial gastrocnemius, vastus lateralis, L4, 5, S1 myotomes.  There was no spontaneous activity at bilateral lumbosacral paraspinal muscles  There was no significant abnormality at left upper extremity muscles, and left cervical paraspinal muscles, with exception of decreased recruitment at the left abductor pollicis brevis, there was no evidence of spontaneous  activity.   Conclusion: This is an abnormal study.  There is electrodiagnostic evidence of chronic bilateral lumbosacral radiculopathy, mainly involving bilateral L4-5 S1 myotomes, this is in the setting of mild bilateral axonal sensorimotor polyneuropathy.  In addition, there is evidence of moderate median neuropathy across the wrist consistent with moderate bilateral carpal tunnel syndromes.    ------------------------------- Marcial Pacas, M.D. PhD [facility] [address], [place], [place] [address] Tel: [phone] Fax: [phone]  Verbal informed consent was obtained from the patient, patient was informed of potential risk of procedure, including bruising, bleeding, hematoma formation, infection, muscle weakness, muscle pain, numbness, among others.        Bishop    Nerve / Sites Muscle Latency Ref. Amplitude Ref. Rel Amp Segments Distance Velocity Ref. Area    ms ms mV mV %  cm m/s m/s mVms  L Median - APB     Wrist APB 5.5 ?4.4 8.1 ?4.0 100 Wrist - APB 7   24.5     Upper arm APB 10.4  7.9  97.7 Upper arm - Wrist 25 52 ?49 23.0  R Median - APB     Wrist APB 5.3 ?4.4 5.3 ?4.0 100 Wrist - APB 7   16.6     Upper arm APB 10.3  4.7  89.2 Upper arm - Wrist 25 50 ?49 17.1  L Ulnar - ADM     Wrist ADM 3.0 ?3.3 11.8 ?6.0 100 Wrist - ADM 7   32.9     B.Elbow ADM 7.1  9.7  82.5 B.Elbow - Wrist 23 56 ?49 36.5     A.Elbow ADM 9.2  9.2  94.8 A.Elbow - B.Elbow 10 49 ?49 37.6  L Peroneal - EDB     Ankle EDB 6.4 ?6.5 3.2 ?2.0 100 Ankle - EDB 9   8.6  Fib head EDB 13.8  2.9  93.5 Fib head - Ankle 32 44 ?44 9.2     Pop fossa EDB 16.0  2.8  95.5 Pop fossa - Fib head 10 44 ?44 7.8         Pop fossa - Ankle      L Tibial - AH     Ankle AH 4.5 ?5.8 8.3 ?4.0 100 Ankle - AH 9   22.4     Pop fossa AH 15.5  6.9  83.2 Pop fossa - Ankle 40 36 ?41 20.0                SNC    Nerve / Sites Rec. Site Peak Lat Ref.  Amp Ref. Segments Distance    ms ms V V  cm  L Sural -  Ankle (Calf)     Calf Ankle 4.1 ?4.4 2 ?6 Calf - Ankle 14  R Sural - Ankle (Calf)     Calf Ankle 4.4 ?4.4 4 ?6 Calf - Ankle 14  L Superficial peroneal - Ankle     Lat leg Ankle 5.4 ?4.4 10 ?6 Lat leg - Ankle 14  R Superficial peroneal - Ankle     Lat leg Ankle 5.3 ?4.4 11 ?6 Lat leg - Ankle 14  L Median - Orthodromic (Dig II, Mid palm)     Dig II Wrist NR ?3.4 NR ?10 Dig II - Wrist 13  R Median - Orthodromic (Dig II, Mid palm)     Dig II Wrist 4.9 ?3.4 3 ?10 Dig II - Wrist 13  L Ulnar - Orthodromic, (Dig V, Mid palm)     Dig V Wrist 2.7 ?3.1 5 ?5 Dig V - Wrist 54                   F  Wave    Nerve F Lat Ref.   ms ms  L Tibial - AH 60.1 ?56.0  L Ulnar - ADM 33.1 ?32.0         EMG Summary Table    Spontaneous MUAP Recruitment  Muscle IA Fib PSW Fasc Other Amp Dur. Poly Pattern  R. Tibialis anterior Normal None None None _______ Normal Normal Normal Reduced  R. Tibialis posterior Normal None None None _______ Normal Normal Normal Reduced  R. Gastrocnemius (Medial head) Normal None None None _______ Normal Normal Normal Reduced  R. Vastus lateralis Normal None None None _______ Normal Normal Normal Reduced  L. Tibialis anterior Normal None None None _______ Normal Normal Normal Reduced  L. Tibialis posterior Normal None None None _______ Normal Normal Normal Reduced  L. Gastrocnemius (Medial head) Normal None None None _______ Normal Normal Normal Reduced  L. Vastus lateralis Normal None None None _______ Normal Normal Normal Reduced  R. Lumbar paraspinals (mid) Normal None None None _______ Normal Normal Normal Normal  R. Lumbar paraspinals (low) Normal None None None _______ Normal Normal Normal Normal  L. Lumbar paraspinals (low) Normal None None None _______ Normal Normal Normal Normal  L. Lumbar paraspinals (mid) Normal None None None _______ Normal Normal Normal Normal  L. First dorsal interosseous Normal None None None _______ Normal Normal Normal Normal  L. Abductor pollicis  brevis Normal None None None _______ Normal Normal Normal Reduced  L. Pronator teres Normal None None None _______ Normal Normal Normal Normal  L. Biceps brachii Normal None None None _______ Normal Normal Normal Normal  L. Deltoid Normal None None None _______ Normal Normal Normal Normal  L. Triceps brachii Normal None None None _______ Normal Normal Normal Normal  L. Cervical paraspinals Normal None None None _______ Normal Normal Normal Normal

## 2019-09-15 ENCOUNTER — Telehealth: Payer: Self-pay | Admitting: Neurology

## 2019-09-15 NOTE — Telephone Encounter (Signed)
Mcarthur Rossetti Josem Kaufmann: [phone] (exp. 09/15/19 to 10/15/19) order sent to GI. They will reach out to the patient to schedule.

## 2019-09-17 ENCOUNTER — Other Ambulatory Visit: Payer: Medicare PPO

## 2019-09-17 ENCOUNTER — Inpatient Hospital Stay: Admission: RE | Admit: 2019-09-17 | Payer: Medicare PPO | Source: Ambulatory Visit

## 2019-09-18 ENCOUNTER — Other Ambulatory Visit: Payer: Self-pay

## 2019-09-18 ENCOUNTER — Ambulatory Visit
Admission: RE | Admit: 2019-09-18 | Discharge: 2019-09-18 | Disposition: A | Discharge status: Home / Self Care | Payer: Medicare PPO | Source: Ambulatory Visit | Attending: Neurology | Admitting: Neurology

## 2019-09-18 DIAGNOSIS — M62561 Muscle wasting and atrophy, not elsewhere classified, right lower leg: Secondary | ICD-10-CM | POA: Diagnosis not present

## 2019-09-18 DIAGNOSIS — R202 Paresthesia of skin: Secondary | ICD-10-CM

## 2019-09-18 DIAGNOSIS — M542 Cervicalgia: Secondary | ICD-10-CM

## 2019-09-18 DIAGNOSIS — R269 Unspecified abnormalities of gait and mobility: Secondary | ICD-10-CM

## 2019-09-18 DIAGNOSIS — R413 Other amnesia: Secondary | ICD-10-CM | POA: Diagnosis not present

## 2019-09-20 ENCOUNTER — Other Ambulatory Visit: Payer: Medicare PPO

## 2019-09-20 ENCOUNTER — Telehealth: Payer: Self-pay | Admitting: Neurology

## 2019-09-20 NOTE — Telephone Encounter (Signed)
IMPRESSION: This MRI of the lumbar spine without contrast shows the following: 1.  At L1-L2, there is moderate spinal stenosis due to degenerative changes as detailed above.  There is mild to moderate left foraminal narrowing and moderate left lateral recess stenosis but no nerve root compression. 2.  At L2-L3, there are degenerative changes as detailed above but no nerve root compression. 3.  At L3-L4, there are degenerative changes causing mild transverse spinal stenosis, moderate right foraminal narrowing and moderate bilateral lateral recess stenosis.  There is no definite nerve root compression. 4.  At L4-L5, there are degenerative changes causing mild foraminal and lateral recess stenosis but no nerve root compression. 5.  At L5-S1, there are degenerative changes causing moderate right foraminal narrowing and moderate right foraminal stenosis but no definite nerve root compression. 6.  Apparent degenerative fusion of L2-L5.   IMPRESSION: This MRI of the cervical spine without contrast shows the following: 1.   At C3-C4, degenerative changes combine to cause mild spinal stenosis and moderately severe bilateral foraminal narrowing with potential to compress either of the C4 nerve roots. 2.   At C4-C5, degenerative changes combine to cause mild spinal stenosis and moderately severe right and moderate left foraminal narrowing with potential for right C5 nerve root compression. 3.   At C5-C6, there is mild spinal stenosis and moderate foraminal narrowing but no nerve root compression. 4.   Partial fusion of the C6 and C7 vertebral bodies.  This could be degenerative in nature. 5.   The spinal cord appears normal.  IMPRESSION: This MRI of the brain without contrast shows the following: 1.   Minimal generalized cortical atrophy, typical for age. 2.   T2/FLAIR hyperintense foci in the hemispheres consistent with mild chronic microvascular ischemic change, typical for age 74.   There were no acute  findings.  Please call patient, MRI of the brain showed mild age-related changes, MRI of cervical spine and lumbar spine showed multilevel degenerative changes, there is no evidence of spinal cord compression, variable good degree of foraminal narrowing,  Most noticeable is at C 4 5, radiology described moderate left, severe right foraminal narrowing,  All above findings are chronic degenerative changes, If he wants, may give him a follow-up appointment to review MRIs

## 2019-09-21 NOTE — Telephone Encounter (Signed)
I have spoken to the patient. He verbalized understanding of the results below. He has scheduled an appt on 09/30/19 for further review with Dr. Krista Blue.

## 2019-09-27 ENCOUNTER — Other Ambulatory Visit: Payer: Medicare PPO

## 2019-09-29 ENCOUNTER — Other Ambulatory Visit: Payer: Medicare PPO

## 2019-09-30 ENCOUNTER — Encounter: Payer: Self-pay | Admitting: Neurology

## 2019-09-30 ENCOUNTER — Ambulatory Visit: Payer: Medicare PPO | Admitting: Neurology

## 2019-09-30 VITALS — BP 119/58 | HR 60 | Ht 72.0 in | Wt 189.0 lb

## 2019-09-30 DIAGNOSIS — M545 Low back pain, unspecified: Secondary | ICD-10-CM | POA: Insufficient documentation

## 2019-09-30 DIAGNOSIS — R202 Paresthesia of skin: Secondary | ICD-10-CM

## 2019-09-30 DIAGNOSIS — R269 Unspecified abnormalities of gait and mobility: Secondary | ICD-10-CM
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
## 2019-11-19 ENCOUNTER — Ambulatory Visit: Payer: Medicare PPO | Admitting: Family Medicine

## 2019-11-19 ENCOUNTER — Other Ambulatory Visit: Payer: Self-pay

## 2019-11-19 ENCOUNTER — Encounter: Payer: Self-pay | Admitting: Family Medicine

## 2019-11-19 VITALS — BP 120/76 | HR 80 | Temp 98.3°F | Wt 188.0 lb

## 2019-11-19 DIAGNOSIS — H10532 Contact blepharoconjunctivitis, left eye: Secondary | ICD-10-CM | POA: Diagnosis not present

## 2019-11-19 MED ORDER — POLYMYXIN B-TRIMETHOPRIM 10000-0.1 UNIT/ML-% OP SOLN: 1.0000 [drp] | OPHTHALMIC | 0 refills | Status: DC

## 2019-11-19 MED ORDER — OLOPATADINE HCL 0.2 % OP SOLN: 1.0000 [drp] | Freq: Every day | OPHTHALMIC | 0 refills | Status: DC

## 2019-11-19 NOTE — Progress Notes (Signed)
Subjective:    Patient ID: Marc Thornton, male    DOB: [birth year], 74 y.o.   MRN: [id number]  No chief complaint on file.   HPI Patient was seen today for acute concern.  Pt was in the garden yesterday getting rid of old rotten tomatoes when he was sprayed in the face by a tomato.  Pt developed itching, mild edema of eyelid, thick drainage in L eye and sensation of increased pressure in the eye with palpation inferior to eye.  Patient wants to sign with water, and use warm compresses, and expired Vigamox eyedrops.    Past Medical History:  Diagnosis Date  . Arthritis   . BPH (benign prostatic hyperplasia)   . Cardiomyopathy ([place])   . Chicken pox   . 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 DDD (degenerative disc disease), cervical   . Depression   . Elevated PSA   . Fracture of foot bone, right, closed 1964  . GERD (gastroesophageal reflux disease)   . Heart murmur   . Hyperlipidemia   . Polyneuropathy   . Rheumatic fever   . Seasonal allergies     No Known Allergies  ROS General: Denies fever, chills, night sweats, changes in weight, changes in appetite HEENT: Denies headaches, ear pain, changes in vision, rhinorrhea, sore throat  +L eye with erythema, thick drainage, and edema of eyelids CV: Denies CP, palpitations, SOB, orthopnea Pulm: Denies SOB, cough, wheezing GI: Denies abdominal pain, nausea, vomiting, diarrhea, constipation GU: Denies dysuria, hematuria, frequency Msk: Denies muscle cramps, joint pains  Neuro: Denies weakness, numbness, tingling Skin: Denies rashes, bruising Psych: Denies depression, anxiety, hallucinations    Objective:    Blood pressure 120/76, pulse 80, temperature 98.3 F (36.8 C), temperature source Oral, weight 188 lb (85.3 kg), SpO2 97 %.   Gen. Pleasant, well-nourished, in no distress, normal affect   HEENT: [place]/AT, face symmetric, mild edema of the lower eyelid and skin inferior to orbit, no drainage, matting of eyelids noted.  L  conjunctival injection, no corneal abrasions noted on Woods lamp with fluorescein dye.  No scleral icterus, PERRLA, EOMI, nares patent without drainage Lungs: no accessory muscle use Cardiovascular: RRR, no peripheral edema Musculoskeletal: No deformities, no cyanosis or clubbing, normal tone Neuro:  A&Ox3, CN II-XII intact, normal gait Skin:  Warm, no lesions/ rash.  Mild edema inferior to the orbit of left eye.   Wt Readings from Last 3 Encounters:  09/30/19 189 lb (85.7 kg)  08/19/19 195 lb (88.5 kg)  07/07/19 197 lb (89.4 kg)    Lab Results  Component Value Date   WBC 6.1 07/07/2019   HGB 15.4 07/07/2019   HCT 45.8 07/07/2019   PLT 148.0 (L) 07/07/2019   GLUCOSE 83 07/07/2019   CHOL 167 07/07/2019   TRIG 175.0 (H) 07/07/2019   HDL 37.90 (L) 07/07/2019   LDLCALC 94 07/07/2019   ALT 16 11/27/2018   AST 25 11/27/2018   NA 139 07/07/2019   K 3.9 07/07/2019   CL 103 07/07/2019   CREATININE 0.95 07/07/2019   BUN 21 07/07/2019   CO2 31 07/07/2019   TSH 2.870 08/19/2019   PSA 3.55 07/07/2019   HGBA1C 5.5 11/27/2018    Assessment/Plan:  Contact blepharoconjunctivitis of left eye  -Consent obtained.  Woods lamp exam preformed.  Pt tolerated procedure well.  Left eye negative for corneal abrasion. -Pataday eyedrops initially sent to pharmacy, however upon return to room pt remembered that he was sprayed in the  eye with a rotten tomato as opposed to just rubbing his eye after handling the tomato plants.  Given this new information discussed prescription for antibacterial eyedrop. -ok to use an allergy eye gtt or artificial tears if needed -cool compresses QID -Given handout -Given precautions for continued or worsened symptoms - Plan: trimethoprim-polymyxin b (POLYTRIM) ophthalmic solution  F/u as needed  Grier Mitts, MD

## 2019-11-19 NOTE — Patient Instructions (Addendum)
Allergic Conjunctivitis, Adult  Allergic conjunctivitis is inflammation of the clear membrane that covers the white part of your eye and the inner surface of your eyelid (conjunctiva). The inflammation is caused by allergies. The blood vessels in the conjunctiva become inflamed and this causes the eyes to become red or pink. The eyes often feel itchy. Allergic conjunctivitis cannot be spread from one person to another person (is not contagious). What are the causes? This condition is caused by an allergic reaction. Common causes of an allergic reaction (allergens) include:  Outdoor allergens, such as: ? Pollen. ? Grass and weeds. ? Mold spores.  Indoor allergens, such as: ? Dust. ? Smoke. ? Mold. ? Pet dander. ? Animal hair. What increases the risk? You may be more likely to develop this condition if you have a family history of allergies, such as:  Allergic rhinitis.  Bronchial asthma.  Atopic dermatitis. What are the signs or symptoms? Symptoms of this condition include eyes that are:  Itchy.  Red.  Watery.  Puffy. Your eyes may also:  Sting or burn.  Have clear drainage coming from them. How is this diagnosed? This condition may be diagnosed by medical history and physical exam. If you have drainage from your eyes, it may be tested to rule out other causes of conjunctivitis. You may also need to see a health care provider who specializes in treating allergies (allergist) or eye conditions (ophthalmologist) for tests to confirm the diagnosis. You may have:  Skin tests to see which allergens are causing your symptoms. These tests involve pricking the skin with a tiny needle and exposing the skin to small amounts of potential allergens to see if your skin reacts.  Blood tests.  Tissue scrapings from your eyelid. These will be examined under a microscope. How is this treated? Treatments for this condition may include:  Cold cloths (compresses) to soothe itching and  swelling.  Washing the face to remove allergens.  Eye drops. These may be prescription or over-the-counter. There are several different types. You may need to try different types to see which one works best for you. Your may need: ? Eye drops that block the allergic reaction (antihistamine). ? Eye drops that reduce swelling and irritation (anti-inflammatory). ? Steroid eye drops to lessen a severe reaction (vernal conjunctivitis).  Oral antihistamine medicines to reduce your allergic reaction. You may need these if eye drops do not help or are difficult to use. Follow these instructions at home:  Avoid known allergens whenever possible.  Take or apply over-the-counter and prescription medicines only as told by your health care provider. These include any eye drops.  Apply a cool, clean washcloth to your eye for 10-20 minutes, 3-4 times a day.  Do not touch or rub your eyes.  Do not wear contact lenses until the inflammation is gone. Wear glasses instead.  Do not wear eye makeup until the inflammation is gone.  Keep all follow-up visits as told by your health care provider. This is important. Contact a health care provider if:  Your symptoms get worse or do not improve with treatment.  You have mild eye pain.  You have sensitivity to light.  You have spots or blisters on your eyes.  You have pus draining from your eye.  You have a fever. Get help right away if:  You have redness, swelling, or other symptoms in only one eye.  Your vision is blurred or you have vision changes.  You have severe eye pain. This information   is not intended to replace advice given to you by your health care provider. Make sure you discuss any questions you have with your health care provider. Document Revised: 03/07/2017 Document Reviewed: 10/06/2015 Elsevier Patient Education  2020 Elsevier Inc.  

## 2019-11-22 ENCOUNTER — Other Ambulatory Visit: Payer: Self-pay

## 2019-11-22 ENCOUNTER — Encounter: Payer: Self-pay | Admitting: Family Medicine

## 2019-11-22 ENCOUNTER — Ambulatory Visit: Payer: Medicare PPO | Admitting: Family Medicine

## 2019-11-22 VITALS — BP 98/60 | HR 59 | Temp 97.8°F | Wt 186.0 lb

## 2019-11-22 DIAGNOSIS — H109 Unspecified conjunctivitis: Secondary | ICD-10-CM

## 2019-11-22 MED ORDER — MOXIFLOXACIN HCL 0.5 % OP SOLN
1.0000 [drp] | Freq: Three times a day (TID) | OPHTHALMIC | 0 refills | Status: DC
Start: 2019-11-22 — End: 2020-02-16

## 2019-11-22 NOTE — Progress Notes (Signed)
Established Patient Office Visit  Subjective:  Patient ID: Marc Thornton, male    DOB: 10/17/1945  Age: 74 y.o. MRN: [id number]  CC:  Chief Complaint  Patient presents with  . Follow-up    f/u on OV from 11/19/2019    HPI Marc Thornton presents for left eye irritation.  Refer to recent note from Dr. Volanda Napoleon for details.  He was out in his garden on Friday and went to pick a tomato that was rotten and apparently some of the juice from that tomato squirted into his left eye.  He had some garden gloves on and rubbed the eye.  He did not have any evidence on exam here Friday for any corneal abrasion or obvious foreign body.  He was placed on Polytrim ophthalmic drops for concern of increased risk of bacterial infection.  He felt the drops may have made his symptoms worse over the weekend.  He had some mild sensation of blurred vision over the weekend and developed increased drainage and some redness.  He stopped the drops yesterday.  His symptoms are improved some today.  His visual symptoms have almost fully resolved.  He does have some drainage and matting and stated this morning his eyelid was crusted over.  No eye pain  No contact use.  Past Medical History:  Diagnosis Date  . Arthritis   . BPH (benign prostatic hyperplasia)   . Cardiomyopathy ([facility])   . Chicken pox   . 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 DDD (degenerative disc disease), cervical   . Depression   . Elevated PSA   . Fracture of foot bone, right, closed 1964  . GERD (gastroesophageal reflux disease)   . Heart murmur   . Hyperlipidemia   . Polyneuropathy   . Rheumatic fever   . Seasonal allergies     Past Surgical History:  Procedure Laterality Date  . COLONOSCOPY     2006 normal exam  . PILONIDAL CYST EXCISION  1966  . WISDOM TOOTH EXTRACTION      Family History  Problem Relation Age of Onset  . Arthritis Mother   . Breast cancer Mother   . Arthritis Father   . Heart disease Father   . Stroke  Father   . Hypertension Father   . Prostate cancer Father   . Dementia Father   . Stroke Paternal Grandfather   . Colon cancer Neg Hx   . Esophageal cancer Neg Hx   . Stomach cancer Neg Hx   . Rectal cancer Neg Hx     Social History   Socioeconomic History  . Marital status: Widowed    Spouse name: Not on file  . Number of children: Not on file  . Years of education: Not on file  . Highest education level: Not on file  Occupational History  . Not on file  Tobacco Use  . Smoking status: Former Research scientist (life sciences)  . Smokeless tobacco: Former Network engineer and Sexual Activity  . Alcohol use: Yes    Comment: occ.  . Drug use: No  . Sexual activity: Not on file  Other Topics Concern  . Not on file  Social History Narrative   Worked as a Community education officer    Married      Social Determinants of Radio broadcast assistant Strain:   . Difficulty of Paying Living Expenses:   Food Insecurity:   . Worried About Charity fundraiser in the Last Year:   .  Ran Out of Food in the Last Year:   Transportation Needs:   . Film/video editor (Medical):   Marland Kitchen Lack of Transportation (Non-Medical):   Physical Activity:   . Days of Exercise per Week:   . Minutes of Exercise per Session:   Stress:   . Feeling of Stress :   Social Connections:   . Frequency of Communication with Friends and Family:   . Frequency of Social Gatherings with Friends and Family:   . Attends Religious Services:   . Active Member of Clubs or Organizations:   . Attends Archivist Meetings:   Marland Kitchen Marital Status:   Intimate Partner Violence:   . Fear of Current or Ex-Partner:   . Emotionally Abused:   Marland Kitchen Physically Abused:   . Sexually Abused:     Outpatient Medications Prior to Visit  Medication Sig Dispense Refill  . acetaminophen (TYLENOL) 500 MG tablet Take 500 mg by mouth every 6 (six) hours as needed.    . Betaine, Trimethylglycine, (TMG, TRIMETHYLGLYCINE, PO)     . Cholecalciferol (VITAMIN D3) 25 MCG  (1000 UT) CAPS Take by mouth. Takes 3-4 times a week    . Multiple Vitamins-Minerals (CENTRUM MEN PO) Take by mouth. Takes 3-4 times a week    . trimethoprim-polymyxin b (POLYTRIM) ophthalmic solution Place 1 drop into the left eye every 4 (four) hours for 7 days. (Patient not taking: Reported on 11/22/2019) 10 mL 0   No facility-administered medications prior to visit.    No Known Allergies  ROS Review of Systems  Constitutional: Negative for chills and fever.  Eyes: Positive for discharge, redness and itching. Negative for pain.       See HPI  Neurological: Negative for headaches.      Objective:    Physical Exam Vitals reviewed.  Constitutional:      Appearance: Normal appearance.  Eyes:     Pupils: Pupils are equal, round, and reactive to light.     Comments: Right conjunctive is normal.  Pupils are both equal round reactive to light.  Left conjunctive is slightly erythematous.  He has little bit of minimal yellowish crusted drainage around the upper and lower lid.  Funduscopic exam is unremarkable of the left eye.  Cardiovascular:     Rate and Rhythm: Normal rate.  Neurological:     Mental Status: He is alert.     BP 98/60 (BP Location: Left Arm, Patient Position: Sitting, Cuff Size: Normal)   Pulse (!) 59   Temp 97.8 F (36.6 C) (Oral)   Wt 186 lb (84.4 kg)   SpO2 97%   BMI 25.23 kg/m  Wt Readings from Last 3 Encounters:  11/22/19 186 lb (84.4 kg)  11/19/19 188 lb (85.3 kg)  09/30/19 189 lb (85.7 kg)     Health Maintenance Due  Topic Date Due  . Hepatitis C Screening  Never done  . INFLUENZA VACCINE  11/07/2019    There are no preventive care reminders to display for this patient.  Lab Results  Component Value Date   TSH 2.870 08/19/2019   Lab Results  Component Value Date   WBC 6.1 07/07/2019   HGB 15.4 07/07/2019   HCT 45.8 07/07/2019   MCV 93.6 07/07/2019   PLT 148.0 (L) 07/07/2019   Lab Results  Component Value Date   NA 139 07/07/2019    K 3.9 07/07/2019   CO2 31 07/07/2019   GLUCOSE 83 07/07/2019   BUN 21 07/07/2019   CREATININE  0.95 07/07/2019   BILITOT 1.0 11/27/2018   ALKPHOS 57 11/27/2018   AST 25 11/27/2018   ALT 16 11/27/2018   PROT 7.4 08/19/2019   ALBUMIN 4.7 11/27/2018   CALCIUM 9.5 07/07/2019   GFR 77.24 07/07/2019   Lab Results  Component Value Date   CHOL 167 07/07/2019   Lab Results  Component Value Date   HDL 37.90 (L) 07/07/2019   Lab Results  Component Value Date   LDLCALC 94 07/07/2019   Lab Results  Component Value Date   TRIG 175.0 (H) 07/07/2019   Lab Results  Component Value Date   CHOLHDL 4 07/07/2019   Lab Results  Component Value Date   HGBA1C 5.5 11/27/2018      Assessment & Plan:   Probable bacterial conjunctivitis left eye.  Possible irritation from Polytrim ophthalmic drops. Visual screen is normal  -Leave off Polytrim ophthalmic drops -Recommend warm compresses several times daily -Vigamox 1 drop 3 times daily left eye\ -Touch base if symptoms not resolving in the next 3 days   No orders of the defined types were placed in this encounter.   Follow-up: No follow-ups on file.    Carolann Littler, MD

## 2019-11-22 NOTE — Telephone Encounter (Signed)
The patient contacted the office. Dr. Volanda Napoleon is out of the office today and the patients PCP is off today.  I transferred the patient to nurse triage.  Waiting for nurse triage notes

## 2019-11-22 NOTE — Patient Instructions (Signed)
Bacterial Conjunctivitis, Adult Bacterial conjunctivitis is an infection of the clear membrane that covers the white part of your eye and the inner surface of your eyelid (conjunctiva). When the blood vessels in your conjunctiva become inflamed, your eye becomes red or pink, and it will probably feel itchy. Bacterial conjunctivitis spreads very easily from person to person (is contagious). It also spreads easily from one eye to the other eye. What are the causes? This condition is caused by bacteria. You may get the infection if you come into close contact with:  A person who is infected with the bacteria.  Items that are contaminated with the bacteria, such as a face towel, contact lens solution, or eye makeup. What increases the risk? You are more likely to develop this condition if you:  Are exposed to other people who have the infection.  Wear contact lenses.  Have a sinus infection.  Have had a recent eye injury or surgery.  Have a weak body defense system (immune system).  Have a medical condition that causes dry eyes. What are the signs or symptoms? Symptoms of this condition include:  Thick, yellowish discharge from the eye. This may turn into a crust on the eyelid overnight and cause your eyelids to stick together.  Tearing or watery eyes.  Itchy eyes.  Burning feeling in your eyes.  Eye redness.  Swollen eyelids.  Blurred vision. How is this diagnosed? This condition is diagnosed based on your symptoms and medical history. Your health care provider may also take a sample of discharge from your eye to find the cause of your infection. This is rarely done. How is this treated? This condition may be treated with:  Antibiotic eye drops or ointment to clear the infection more quickly and prevent the spread of infection to others.  Oral antibiotic medicines to treat infections that do not respond to drops or ointments or that last longer than 10 days.  Cool, wet  cloths (cool compresses) placed on the eyes.  Artificial tears applied 2-6 times a day. Follow these instructions at home: Medicines  Take or apply your antibiotic medicine as told by your health care provider. Do not stop taking or applying the antibiotic even if you start to feel better.  Take or apply over-the-counter and prescription medicines only as told by your health care provider.  Be very careful to avoid touching the edge of your eyelid with the eye-drop bottle or the ointment tube when you apply medicines to the affected eye. This will keep you from spreading the infection to your other eye or to other people. Managing discomfort  Gently wipe away any drainage from your eye with a warm, wet washcloth or a cotton ball.  Apply a clean, cool compress to your eye for 10-20 minutes, 3-4 times a day. General instructions  Do not wear contact lenses until the inflammation is gone and your health care provider says it is safe to wear them again. Ask your health care provider how to sterilize or replace your contact lenses before you use them again. Wear glasses until you can resume wearing contact lenses.  Avoid wearing eye makeup until the inflammation is gone. Throw away any old eye cosmetics that may be contaminated.  Change or wash your pillowcase every day.  Do not share towels or washcloths. This may spread the infection.  Wash your hands often with soap and water. Use paper towels to dry your hands.  Avoid touching or rubbing your eyes.  Do not   drive or use heavy machinery if your vision is blurred. Contact a health care provider if:  You have a fever.  Your symptoms do not get better after 10 days. Get help right away if you have:  A fever and your symptoms suddenly get worse.  Severe pain when you move your eye.  Facial pain, redness, or swelling.  Sudden loss of vision. Summary  Bacterial conjunctivitis is an infection of the clear membrane that covers the  white part of your eye and the inner surface of your eyelid (conjunctiva).  Bacterial conjunctivitis spreads very easily from person to person (is contagious).  Wash your hands often with soap and water. Use paper towels to dry your hands.  Take or apply your antibiotic medicine as told by your health care provider. Do not stop taking or applying the antibiotic even if you start to feel better.  Contact a health care provider if you have a fever or your symptoms do not get better after 10 days. This information is not intended to replace advice given to you by your health care provider. Make sure you discuss any questions you have with your health care provider. Document Revised: 07/14/2018 Document Reviewed: 10/29/2017 Elsevier Patient Education  2020 Elsevier Inc.  

## 2019-11-26 NOTE — Telephone Encounter (Signed)
Pt was seen by Dr Sinclair Ship on 11/22/2019

## 2019-12-11 ENCOUNTER — Encounter: Payer: Self-pay | Admitting: Adult Health

## 2019-12-29 ENCOUNTER — Ambulatory Visit: Payer: Medicare PPO

## 2020-01-25 ENCOUNTER — Encounter: Payer: Self-pay | Admitting: Adult Health

## 2020-01-26 ENCOUNTER — Telehealth: Payer: Self-pay | Admitting: Neurology

## 2020-01-26 DIAGNOSIS — M79642 Pain in left hand: Secondary | ICD-10-CM

## 2020-01-26 DIAGNOSIS — M79602 Pain in left arm: Secondary | ICD-10-CM

## 2020-01-26 NOTE — Telephone Encounter (Signed)
I do think evaluation by hand surgeon is the best first step, They offer good hand rehab as well, not necessarily surgery to begin with.  If he still prefers to try [facility] first, we can.

## 2020-01-26 NOTE — Telephone Encounter (Signed)
Order sent.

## 2020-01-26 NOTE — Telephone Encounter (Signed)
I returned the call to the patient. He would like to go to therapy first then consider the referral to the hand specialist, if he does not improve.  Ok, per Dr. Krista Blue, to place orders for OT at [facility] at [facility].

## 2020-01-26 NOTE — Telephone Encounter (Signed)
I forgot to mention, there is a strong positional component which I would not expect with carpal tunnel -  Even while taking care to keep my wrists in neutral position, raising my arms to hold the steering wheel when driving evokes a strong symptomatic response (painful pins and needles, numbness, weak grip) within a couple of minutes.  Reaching overhead (to change a lightbulb or to adjust the showerhead, for example) is something I can do for only a few seconds at a time before my hand goes numb. I am unable to form a fist with my left hand - the best I can do is curl my fingers into a radius of about 2 cm. This effort is accompanied by discomfort in the dorsal ligaments superficial to the carpals and metacarpals.  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

## 2020-01-26 NOTE — Telephone Encounter (Addendum)
I called the patient. He is more interested in trying physical therapy first rather than seeing a Copy. He feels his symptoms are being caused by more than his CTS. He said it was previously discussed at his last appt to go to our neuro-rehab facility. However, the [facility] [place] is closer to his home. If there is no preference of facility by Dr. Krista Blue, then he would like to go the location closest to him for convenience.

## 2020-01-26 NOTE — Addendum Note (Signed)
Addended by: Noberto Retort C on: 01/26/2020 02:14 PM   Modules accepted: Orders

## 2020-02-10 ENCOUNTER — Encounter: Payer: Self-pay | Admitting: Occupational Therapy

## 2020-02-10 ENCOUNTER — Other Ambulatory Visit: Payer: Self-pay

## 2020-02-10 ENCOUNTER — Ambulatory Visit: Payer: Medicare PPO | Attending: Neurology | Admitting: Occupational Therapy

## 2020-02-10 DIAGNOSIS — R278 Other lack of coordination: Secondary | ICD-10-CM | POA: Diagnosis not present

## 2020-02-10 DIAGNOSIS — M25511 Pain in right shoulder: Secondary | ICD-10-CM | POA: Insufficient documentation

## 2020-02-10 DIAGNOSIS — M25642 Stiffness of left hand, not elsewhere classified: Secondary | ICD-10-CM | POA: Insufficient documentation

## 2020-02-10 DIAGNOSIS — M6281 Muscle weakness (generalized): Secondary | ICD-10-CM | POA: Diagnosis not present

## 2020-02-10 DIAGNOSIS — R208 Other disturbances of skin sensation: Secondary | ICD-10-CM | POA: Diagnosis not present

## 2020-02-10 DIAGNOSIS — G8929 Other chronic pain: Secondary | ICD-10-CM | POA: Insufficient documentation

## 2020-02-10 DIAGNOSIS — M79642 Pain in left hand: Secondary | ICD-10-CM | POA: Diagnosis not present

## 2020-02-10 DIAGNOSIS — M79602 Pain in left arm: Secondary | ICD-10-CM | POA: Diagnosis not present

## 2020-02-10 NOTE — Therapy (Signed)
[facility] [address] [place], [place], [address] Phone: [phone]   Fax:  [phone]  Occupational Therapy Evaluation  Patient Details  Name: Marc Thornton MRN: [id number] Date of Birth: 02-19-1946 Referring Provider (OT): Dr Krista Blue   Encounter Date: 02/10/2020   OT End of Session - 02/10/20 1745    Visit Number 1    Number of Visits 17    Date for OT Re-Evaluation 04/25/20    Authorization Type [employer]    Progress Note Due on Visit 10    OT Start Time 1530    OT Stop Time 1615    OT Time Calculation (min) 45 min    Activity Tolerance Patient tolerated treatment well    Behavior During Therapy [facility] for tasks assessed/performed           Past Medical History:  Diagnosis Date  . Arthritis   . BPH (benign prostatic hyperplasia)   . Cardiomyopathy ([place])   . Chicken pox   . 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 DDD (degenerative disc disease), cervical   . Depression   . Elevated PSA   . Fracture of foot bone, right, closed 1964  . GERD (gastroesophageal reflux disease)   . Heart murmur   . Hyperlipidemia   . Polyneuropathy   . Rheumatic fever   . Seasonal allergies     Past Surgical History:  Procedure Laterality Date  . COLONOSCOPY     2006 normal exam  . PILONIDAL CYST EXCISION  1966  . WISDOM TOOTH EXTRACTION      There were no vitals filed for this visit.   Subjective Assessment - 02/10/20 1549    Subjective  Patient indicates pain in left >right, pain at night    Currently in Pain? No/denies             [facility] OT Assessment - 02/10/20 0001      Assessment   Medical Diagnosis Left arm and hand pain    Referring Provider (OT) Dr Krista Blue    Onset Date/Surgical Date 01/26/20    Hand Dominance Right    Prior Therapy NA      Prior Function   Level of Independence Independent with basic ADLs;Independent    Vocation Retired    Human resources officer, remodel, working in yard       ADL   [employer] Increased time      Written Expression   Dominant Hand Right      Cognition   Overall Cognitive Status Within Functional Limits for tasks assessed      Posture/Postural Control   Posture/Postural Control No significant limitations      Sensation   Light Touch Impaired by gross assessment    Additional Comments Hypersensitive tips of fingers left - 3-5      Coordination   Gross Motor Movements are Fluid and Coordinated No    Fine Motor Movements are Fluid and Coordinated No    Coordination and Movement Description Moves quickly, lacks accuracy     Finger Nose Finger Test Overshoots LUE    9 Hole Peg Test Right;Left    Right 9 Hole Peg Test 26.87    Left 9 Hole Peg Test 31.04    Coordination Frequently drops pegs BUE, often picked up 2 simultaneously      ROM / Strength   AROM / PROM / Strength AROM;Strength      AROM   Overall AROM  Deficits    AROM Assessment Site Shoulder    Right/Left Shoulder Right;Left    Right Shoulder Flexion 135 Degrees    Left Shoulder Flexion 128 Degrees      Strength   Overall Strength Deficits   LUE   Overall Strength Comments RUE WFL      Hand Function   Right Hand Gross Grasp Functional    Right Hand Grip (lbs) 84    Right Hand Lateral Pinch 27 lbs    Left Hand Gross Grasp Impaired    Left Hand Grip (lbs) 62    Left Hand Lateral Pinch 25 lbs                           OT Education - 02/10/20 1745    Education Details Reviewed findings of OT eval, and potential goals    Person(s) Educated Patient    Methods Explanation    Comprehension Verbalized understanding            OT Short Term Goals - 02/10/20 1807      OT SHORT TERM GOAL #1   Title Patient will complete HEP to address hand range of motion    Time 4    Period Weeks    Status New    Target Date 03/26/20      OT SHORT TERM GOAL #2   Title Patient will report ability to sleep for longer periods of time due to  decreased shoulder pain    Time 4    Period Weeks    Status New      OT SHORT TERM GOAL #3   Title Patient will demonstrate 5 lb increase in grip strength in left hand to aide in grip on hand tools.    Time 4    Period Weeks    Status New             OT Long Term Goals - 02/10/20 1811      OT LONG TERM GOAL #1   Title Patient will complete updated HEP to address proximal strength in BUE    Time 8    Period Weeks    Status New    Target Date 04/25/20      OT LONG TERM GOAL #2   Title Patient will self report decreased time to button shirts using BUE method    Time 8    Period Weeks    Status New      OT LONG TERM GOAL #3   Title Patient will demonstrate 3 second reduction in 9 hole peg test left UE    Time 8    Period Weeks    Status New      OT LONG TERM GOAL #4   Title Patient will report improved comfort and ability to maintain hands on steering wheel while driving for 10 min or greater.    Time 8    Period Weeks    Status New                 Plan - 02/10/20 1746    Clinical Impression Statement Patient is a 74 yr old male with 6 yr history of pain in LUE, and intermittent pain in RUE.  Patient has had extensive neuro work up and has shared his diagnosis of polyneuropathy, cervical stenosis, and carpal tunnel syndrome.  Patient symptoms appear to be multifaceted.  Patient will benefit from skilled OT intervention to address concerns  and improve symptoms of pain in BUE, stiffness, and decreased coordination in LUE.    OT Occupational Profile and History Problem Focused Assessment - Including review of records relating to presenting problem    Occupational performance deficits (Please refer to evaluation for details): IADL's;Rest and Sleep;Leisure    Body Structure / Function / Physical Skills ADL;Coordination;GMC;UE functional use;Sensation;Pain;IADL;Flexibility;Decreased knowledge of use of DME;Dexterity;FMC;Improper spinal/pelvic alignment;Strength;ROM     Rehab Potential Good    Clinical Decision Making Limited treatment options, no task modification necessary    Comorbidities Affecting Occupational Performance: None    Modification or Assistance to Complete Evaluation  No modification of tasks or assist necessary to complete eval    OT Frequency 2x / week    OT Duration 8 weeks    OT Treatment/Interventions Self-care/ADL training;Therapeutic exercise;Aquatic Therapy;Moist Heat;Neuromuscular education;Splinting;Patient/family education;Therapeutic activities;Fluidtherapy;Passive range of motion;Manual Therapy;DME and/or AE instruction;Cryotherapy;Ultrasound    Plan Begin HEP hands, shoulders, review sleeping positions    Consulted and Agree with Plan of Care Patient           Patient will benefit from skilled therapeutic intervention in order to improve the following deficits and impairments:   Body Structure / Function / Physical Skills: ADL, Coordination, GMC, UE functional use, Sensation, Pain, IADL, Flexibility, Decreased knowledge of use of DME, Dexterity, FMC, Improper spinal/pelvic alignment, Strength, ROM       Visit Diagnosis: Pain in left hand - Plan: Ot plan of care cert/re-cert  Pain in left arm - Plan: Ot plan of care cert/re-cert  Chronic right shoulder pain - Plan: Ot plan of care cert/re-cert  Stiffness of left hand, not elsewhere classified - Plan: Ot plan of care cert/re-cert  Other lack of coordination - Plan: Ot plan of care cert/re-cert  Muscle weakness (generalized) - Plan: Ot plan of care cert/re-cert  Other disturbances of skin sensation - Plan: Ot plan of care cert/re-cert    Problem List Patient Active Problem List   Diagnosis Date Noted  . Midline low back pain without sciatica 09/30/2019  . Atrophy of muscle of right lower leg 09/13/2019  . Lumbar radiculopathy 09/13/2019  . Peripheral neuropathy 09/13/2019  . Gait abnormality 08/19/2019  . Paresthesia 08/19/2019  . Neck pain 08/19/2019  .  Memory loss 08/19/2019  . Chronic diastolic heart failure ([place]) 03/21/2016  . Murmur, cardiac 01/15/2016  . Dyspnea 01/15/2016  . Abnormal ECG 01/15/2016  . Hypertriglyceridemia 01/15/2016    Marlowe Sax M,OTR/L 02/10/2020, 6:16 PM  [facility] [address] [place], [place], [address] Phone: [phone]   Fax:  [phone]  Name: Marc Thornton MRN: [id number] Date of Birth: 21-Jan-1946

## 2020-02-15 ENCOUNTER — Ambulatory Visit: Payer: Medicare PPO | Admitting: Occupational Therapy

## 2020-02-15 ENCOUNTER — Other Ambulatory Visit: Payer: Self-pay

## 2020-02-15 ENCOUNTER — Encounter: Payer: Self-pay | Admitting: Occupational Therapy

## 2020-02-15 DIAGNOSIS — M25511 Pain in right shoulder: Secondary | ICD-10-CM | POA: Diagnosis not present

## 2020-02-15 DIAGNOSIS — M79602 Pain in left arm: Secondary | ICD-10-CM

## 2020-02-15 DIAGNOSIS — G8929 Other chronic pain: Secondary | ICD-10-CM

## 2020-02-15 DIAGNOSIS — M6281 Muscle weakness (generalized): Secondary | ICD-10-CM | POA: Diagnosis not present

## 2020-02-15 DIAGNOSIS — M79642 Pain in left hand: Secondary | ICD-10-CM | POA: Diagnosis not present

## 2020-02-15 DIAGNOSIS — M25642 Stiffness of left hand, not elsewhere classified: Secondary | ICD-10-CM | POA: Diagnosis not present

## 2020-02-15 DIAGNOSIS — R278 Other lack of coordination: Secondary | ICD-10-CM | POA: Diagnosis not present

## 2020-02-15 DIAGNOSIS — R208 Other disturbances of skin sensation: Secondary | ICD-10-CM | POA: Diagnosis not present

## 2020-02-15 NOTE — Patient Instructions (Signed)
AROM: Wrist Extension   .  With ____ palm down, bend wrist up. Repeat __15__ times per set.  Do __4-6__ sessions per day.    AROM: Wrist Flexion   With_____ palm up, bend wrist up. Repeat __15__ times per set.  Do _4-6___ sessions per day.   AROM: Forearm Pronation / Supination   With ____ arm in handshake position, slowly rotate palm down until stretch is felt. Relax. Then rotate palm up until stretch is felt. Repeat _15___ times per set. Do _4-6___ sessions per day.  Copyright  VHI. All rights reserved.     Flexor Tendon Gliding (Active Hook Fist)   With fingers and knuckles straight, bend middle and tip joints. Do not bend large knuckles. Repeat _10-15___ times. Do _4-6___ sessions per day.  MP Flexion (Active)   With back of hand on table, bend large knuckles as far as they will go, keeping small joints straight. Repeat _10-15___ times. Do __4-6__ sessions per day. Activity: Reach into a narrow container.*      Finger Flexion / Extension   With palm up, bend fingers of left hand toward palm, making a  fist. Straighten fingers, opening fist. Repeat sequence _10-15___ times per session. Do _4-6__ sessions per day. Hand Variation: Palm down   Copyright  VHI. All rights reserved.

## 2020-02-15 NOTE — Therapy (Signed)
[facility] [address] [place], [place], [address] Phone: [phone]   Fax:  [phone]  Occupational Therapy Treatment  Patient Details  Name: Marc Thornton MRN: [id number] Date of Birth: 1945/10/26 Referring Provider (OT): Dr Krista Blue   Encounter Date: 02/15/2020   OT End of Session - 02/15/20 1537    Visit Number 2    Number of Visits 17    Date for OT Re-Evaluation 04/25/20    Authorization Type [employer]    Progress Note Due on Visit 10    OT Start Time 1536    OT Stop Time 1619    OT Time Calculation (min) 43 min    Activity Tolerance Patient tolerated treatment well    Behavior During Therapy [facility] for tasks assessed/performed           Past Medical History:  Diagnosis Date  . Arthritis   . BPH (benign prostatic hyperplasia)   . Cardiomyopathy ([place])   . Chicken pox   . 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 DDD (degenerative disc disease), cervical   . Depression   . Elevated PSA   . Fracture of foot bone, right, closed 1964  . GERD (gastroesophageal reflux disease)   . Heart murmur   . Hyperlipidemia   . Polyneuropathy   . Rheumatic fever   . Seasonal allergies     Past Surgical History:  Procedure Laterality Date  . COLONOSCOPY     2006 normal exam  . PILONIDAL CYST EXCISION  1966  . WISDOM TOOTH EXTRACTION      There were no vitals filed for this visit.   Subjective Assessment - 02/15/20 1537    Subjective  Pt reports "nothing too terrible". "alwas have some discomfort in my left hand - more numbness than pain". Pt reports most concern with levator area and cervical region.    Currently in Pain? Yes    Pain Score 3     Pain Location Hand    Pain Orientation Left    Pain Descriptors / Indicators Numbness    Pain Onset More than a month ago    Pain Frequency Constant                        OT Treatments/Exercises (OP) - 02/15/20 1554      Exercises    Exercises Wrist      Wrist Exercises   Other wrist exercises AROM see pt instruction    Other wrist exercises supination/pronation wheel      Modalities   Modalities Moist Heat      Moist Heat Therapy   Number Minutes Moist Heat 15 Minutes    Moist Heat Location Shoulder   LUE                 OT Education - 02/15/20 1542    Education Details see pt instructions - AROM of LUE    Person(s) Educated Patient    Methods Explanation    Comprehension Verbalized understanding            OT Short Term Goals - 02/15/20 1651      OT SHORT TERM GOAL #1   Title Patient will complete HEP to address hand range of motion    Time 4    Period Weeks    Status On-going    Target Date 03/26/20      OT SHORT TERM GOAL #2  Title Patient will report ability to sleep for longer periods of time due to decreased shoulder pain    Time 4    Period Weeks    Status New      OT SHORT TERM GOAL #3   Title Patient will demonstrate 5 lb increase in grip strength in left hand to aide in grip on hand tools.    Time 4    Period Weeks    Status New             OT Long Term Goals - 02/10/20 1811      OT LONG TERM GOAL #1   Title Patient will complete updated HEP to address proximal strength in BUE    Time 8    Period Weeks    Status New    Target Date 04/25/20      OT LONG TERM GOAL #2   Title Patient will self report decreased time to button shirts using BUE method    Time 8    Period Weeks    Status New      OT LONG TERM GOAL #3   Title Patient will demonstrate 3 second reduction in 9 hole peg test left UE    Time 8    Period Weeks    Status New      OT LONG TERM GOAL #4   Title Patient will report improved comfort and ability to maintain hands on steering wheel while driving for 10 min or greater.    Time 8    Period Weeks    Status New                 Plan - 02/15/20 1652    Clinical Impression Statement Pt continues to experience discomfort in LUE but is  progressing towards goals.    OT Occupational Profile and History Problem Focused Assessment - Including review of records relating to presenting problem    Occupational performance deficits (Please refer to evaluation for details): IADL's;Rest and Sleep;Leisure    Body Structure / Function / Physical Skills ADL;Coordination;GMC;UE functional use;Sensation;Pain;IADL;Flexibility;Decreased knowledge of use of DME;Dexterity;FMC;Improper spinal/pelvic alignment;Strength;ROM    Rehab Potential Good    Clinical Decision Making Limited treatment options, no task modification necessary    Comorbidities Affecting Occupational Performance: None    Modification or Assistance to Complete Evaluation  No modification of tasks or assist necessary to complete eval    OT Frequency 2x / week    OT Duration 8 weeks    OT Treatment/Interventions Self-care/ADL training;Therapeutic exercise;Aquatic Therapy;Moist Heat;Neuromuscular education;Splinting;Patient/family education;Therapeutic activities;Fluidtherapy;Passive range of motion;Manual Therapy;DME and/or AE instruction;Cryotherapy;Ultrasound    Plan Begin HEP hands, shoulders, review sleeping positions    Consulted and Agree with Plan of Care Patient           Patient will benefit from skilled therapeutic intervention in order to improve the following deficits and impairments:   Body Structure / Function / Physical Skills: ADL, Coordination, GMC, UE functional use, Sensation, Pain, IADL, Flexibility, Decreased knowledge of use of DME, Dexterity, FMC, Improper spinal/pelvic alignment, Strength, ROM       Visit Diagnosis: Pain in left hand  Pain in left arm  Chronic right shoulder pain  Stiffness of left hand, not elsewhere classified  Other lack of coordination  Muscle weakness (generalized)  Other disturbances of skin sensation    Problem List Patient Active Problem List   Diagnosis Date Noted  . Midline low back pain without sciatica  09/30/2019  . Atrophy  of muscle of right lower leg 09/13/2019  . Lumbar radiculopathy 09/13/2019  . Peripheral neuropathy 09/13/2019  . Gait abnormality 08/19/2019  . Paresthesia 08/19/2019  . Neck pain 08/19/2019  . Memory loss 08/19/2019  . Chronic diastolic heart failure ([place]) 03/21/2016  . Murmur, cardiac 01/15/2016  . Dyspnea 01/15/2016  . Abnormal ECG 01/15/2016  . Hypertriglyceridemia 01/15/2016    Zachery Conch MOT, OTR/L  02/15/2020, 4:53 PM  [facility] [address] [place], [place], [address] Phone: [phone]   Fax:  [phone]  Name: Marc Thornton MRN: [id number] Date of Birth: Aug 30, 1945

## 2020-02-16 ENCOUNTER — Ambulatory Visit: Payer: Medicare PPO | Admitting: Cardiovascular Disease

## 2020-02-16 ENCOUNTER — Encounter: Payer: Self-pay | Admitting: Cardiovascular Disease

## 2020-02-16 VITALS — BP 128/72 | HR 59 | Ht 72.0 in | Wt 195.0 lb

## 2020-02-16 DIAGNOSIS — I5032 Chronic diastolic (congestive) heart failure: Secondary | ICD-10-CM

## 2020-02-16 DIAGNOSIS — E781 Pure hyperglyceridemia: Secondary | ICD-10-CM | POA: Diagnosis not present

## 2020-02-16 DIAGNOSIS — I679 Cerebrovascular disease, unspecified: Secondary | ICD-10-CM

## 2020-02-16 DIAGNOSIS — I422 Other hypertrophic cardiomyopathy: Secondary | ICD-10-CM | POA: Diagnosis not present

## 2020-02-16 NOTE — Progress Notes (Signed)
Cardiology Office Note    Date:  02/16/2020   ID:  Marc Thornton, DOB 1945-05-25, MRN [id number]  PCP:  [name], NP  Cardiologist:   Sanda Klein, MD   Chief complaint: Follow-up cardiomyopathy, recent echocardiogram and arrhythmia monitor   History of Present Illness:  Marc Thornton is a 74 y.o. male with an echocardiogram suspicious for possible hypertrophic cardiomyopathy, work-up triggered by abnormal ECG.  He has no cardiovascular complaints.  He specifically denies shortness of breath at rest or with activity.  He sleeps poorly due to pain in both hands which she attributes to carpal tunnel syndrome.  He has had abnormal nerve conduction studies.  He denies lower extremity edema.  He does not have snoring or history of sleep apnea.  He has not had palpitations or syncope.  He does not have angina.  He is very preoccupied by a family history of neurological decline at advanced ages (72s) which has led to the demise of his father and paternal uncles with a fairly predictable pattern of dementia culminating in a stroke.  He thinks that there is probably a familial pattern of "small vessel disease".  He is concerned that he has noticed subtle reductions in his frontal lobe functions, the patient strategizing and difficulty finding words.  His MRI does confirm some white matter abnormalities on T2 flair imaging suggestive of low paralysis.  He has been seeing Dr. Krista Blue in the neurology clinic.  He is less active since the passing of his wife who died about 18 months ago after a long struggle with progressive suprabulbar palsy.  A follow-up echocardiogram in September 2020 shows no meaningful change from 2017.  His event monitor in 2020 shows frequent PVCs and occasional brief paroxysmal atrial tachycardia but no evidence of VT or atrial fibrillation or any significant bradycardia.  A cardiac PYP scan did not show any evidence of amyloidosis.  I took another look at his echocardiogram  from September 2020.  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  There is no evidence of systolic anterior motion of the mitral valve or left ventricular outflow tract obstruction, although he has an inducible murmur with Valsalva maneuver and physical exam.  His EKG continues to show very striking changes of left ventricular hypertrophy with prominent repolarization abnormalities.  I have offered him a cardiac MRI to clarify the diagnosis of hypertrophic cardiomyopathy but he has declined, since he is worried about the side effects of gadolinium contrast.  The nuclear perfusion images were normal. He was only able to exercise for 4-1/2 minutes (5.6 METS) before he reached a heart rate of 149 bpm. He had a hypertensive response to exercise with a diastolic blood pressure that increased to 107 mmHg. Exercise performance was good. His echocardiogram did not show evidence of hypertrophic obstructive cardiomyopathy or valvular aortic stenosis. Left ventricular ejection fraction was normal. There was indeed evidence of diastolic dysfunction. The mitral annulus diastolic velocities were markedly reduced and the E/e' ratio was borderline significant for elevated filling pressures. Surprisingly, the measurements did not show thickening of the ventricular walls or left atrial enlargement.  He spent a year and a half in [place] in the [employer] and had some exposure to [employer]. He quit smoking 50 years ago.   There is a strong family history of cerebrovascular disease and dementia on his father's side. His paternal grandfather died at age 69 of a stroke. His father had triple bypass  at age 73 and lived to be 77, after having a stroke at age 80. His mother died at age 30 from metastatic breast cancer. No family history of hypertrophic cardiomyopathy, congestive heart failure, unexplained sudden cardiac death or her ventricular arrhythmia. He believes his  father had amyloid angiopathy and whether this might be hereditary.   Past Medical History:  Diagnosis Date  . Arthritis   . BPH (benign prostatic hyperplasia)   . Cardiomyopathy ([place])   . Chicken pox   . 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 DDD (degenerative disc disease), cervical   . Depression   . Elevated PSA   . Fracture of foot bone, right, closed 1964  . GERD (gastroesophageal reflux disease)   . Heart murmur   . Hyperlipidemia   . Polyneuropathy   . Rheumatic fever   . Seasonal allergies     Past Surgical History:  Procedure Laterality Date  . COLONOSCOPY     2006 normal exam  . PILONIDAL CYST EXCISION  1966  . WISDOM TOOTH EXTRACTION      Current Medications: Outpatient Medications Prior to Visit  Medication Sig Dispense Refill  . acetaminophen (TYLENOL) 500 MG tablet Take 500 mg by mouth every 6 (six) hours as needed.    . Betaine, Trimethylglycine, (TMG, TRIMETHYLGLYCINE, PO)     . Cholecalciferol (VITAMIN D3) 25 MCG (1000 UT) CAPS Take by mouth. Takes 3-4 times a week    . Multiple Vitamins-Minerals (CENTRUM MEN PO) Take by mouth. Takes 3-4 times a week    . moxifloxacin (VIGAMOX) 0.5 % ophthalmic solution Place 1 drop into the left eye 3 (three) times daily. 3 mL 0   No facility-administered medications prior to visit.     Allergies:   Patient has no known allergies.   Social History   Socioeconomic History  . Marital status: Widowed    Spouse name: Not on file  . Number of children: Not on file  . Years of education: Not on file  . Highest education level: Not on file  Occupational History  . Not on file  Tobacco Use  . Smoking status: Former Research scientist (life sciences)  . Smokeless tobacco: Former Network engineer and Sexual Activity  . Alcohol use: Yes    Comment: occ.  . Drug use: No  . Sexual activity: Not on file  Other Topics Concern  . Not on file  Social History Narrative   Worked as a Community education officer    Married      Social Determinants of  Radio broadcast assistant Strain:   . Difficulty of Paying Living Expenses: Not on file  Food Insecurity:   . Worried About Charity fundraiser in the Last Year: Not on file  . Ran Out of Food in the Last Year: Not on file  Transportation Needs:   . Lack of Transportation (Medical): Not on file  . Lack of Transportation (Non-Medical): Not on file  Physical Activity:   . Days of Exercise per Week: Not on file  . Minutes of Exercise per Session: Not on file  Stress:   . Feeling of Stress : Not on file  Social Connections:   . Frequency of Communication with Friends and Family: Not on file  . Frequency of Social Gatherings with Friends and Family: Not on file  . Attends Religious Services: Not on file  . Active Member of Clubs or Organizations: Not on file  . Attends Archivist Meetings: Not on  file  . Marital Status: Not on file     Family History:  The patient's family history includes Arthritis in his father and mother; Breast cancer in his mother; Dementia in his father; Heart disease in his father; Hypertension in his father; Prostate cancer in his father; Stroke in his father and paternal grandfather.   ROS:   Please see the history of present illness.    ROS All other systems reviewed and are negative.   PHYSICAL EXAM:   VS:  BP 128/72 (BP Location: Left Arm, Patient Position: Sitting, Cuff Size: Normal)   Pulse (!) 59   Ht 6' (1.829 m)   Wt 195 lb (88.5 kg)   BMI 26.45 kg/m      General: Alert, oriented x3, no distress, appears healthy and fit, mildly overweight Head: no evidence of trauma, PERRL, EOMI, no exophtalmos or lid lag, no myxedema, no xanthelasma; normal ears, nose and oropharynx Neck: normal jugular venous pulsations and no hepatojugular reflux; brisk carotid pulses without delay and no carotid bruits Chest: clear to auscultation, no signs of consolidation by percussion or palpation, normal fremitus, symmetrical and full respiratory  excursions Cardiovascular: normal position and quality of the apical impulse, regular rhythm, normal first and second heart sounds, no murmurs, rubs or gallops at rest.  Following the Valsalva maneuver there is a midsystolic murmur heard well over the mid precordium always after 2 or 3 heartbeats. Abdomen: no tenderness or distention, no masses by palpation, no abnormal pulsatility or arterial bruits, normal bowel sounds, no hepatosplenomegaly Extremities: no clubbing, cyanosis or edema; 2+ radial, ulnar and brachial pulses bilaterally; 2+ right femoral, posterior tibial and dorsalis pedis pulses; 2+ left femoral, posterior tibial and dorsalis pedis pulses; no subclavian or femoral bruits Neurological: grossly nonfocal Psych: Normal mood and affect   Wt Readings from Last 3 Encounters:  02/16/20 195 lb (88.5 kg)  11/22/19 186 lb (84.4 kg)  11/19/19 188 lb (85.3 kg)      Studies/Labs Reviewed:   ECHO 12/10/2018:  1. The left ventricle has hyperdynamic systolic function, with an ejection fraction of >65%. The cavity size was normal. Left ventricular diastolic Doppler parameters are consistent with impaired relaxation. Indeterminate filling pressures The E/e' is  8-15. No evidence of left ventricular regional wall motion abnormalities.  2. The right ventricle has normal systolic function. The cavity was normal. There is no increase in right ventricular wall thickness.  3. The mitral valve is grossly normal.  4. The aortic valve was not well visualized. No stenosis of the aortic valve.  5. The aorta is normal unless otherwise noted.  6. The interatrial septum was not well visualized.  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  There was no evidence of systolic anterior motion of the mitral valve or any left ventricular outflow tract obstruction at rest, but provocative maneuvers were not performed.  Event monitor  01/28/2019: Abnormal event monitor due to the presence of frequent PVCs and occasional brief runs of nonsustained atrial tachycardia.   There is no evidence of significant bradycardia, ventricular tachycardia or atrial fibrillation.  EKG: EKG is ordered today and is very similar to previous tracings.  He has borderline sinus bradycardia, left atrial abnormality, prominent changes of left ventricular hypertrophy with mild QRS broadening (100 ms), prominent secondary ST segment depression and T wave inversion, normal QTC 407 ms.  As before there is a very certain R wave transition in lead  V2 and a deep Q-wave in lead aVL. LABS: [facility] 10/20/2015 Cholesterol 178, triglycerides 303, HDL 43, LDL 74, hemoglobin A1c 5.4%  07/07/2019 Total cholesterol 167, HDL 38, LDL 94, triglycerides 175 Hemoglobin A1c 5.5% Hemoglobin 15.4, creatinine 0.95, potassium 3.9, TSH 2.87  ASSESSMENT:    No diagnosis found.   PLAN:  In order of problems listed above:   1. CHF: has no cardiac complaints, but he has become more sedentary.  When he performed his stress test it was evident that his exercise capacity was a little more limited than he acknowledges.  Seems to have NYHA functional class I-2 and he can perform all activities of daily living without limitations.  Diastolic function abnormalities were present on his echocardiogram, without overt elevation in filling pressures.  He does not require diuretics or beta-blockers. 2. 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  The basal segments of the left ventricle have virtually normal thickness, but the apex appears to be hypertrophic with cavity obliteration.  He has a dynamic outflow tract murmur with the Valsalva maneuver only, but no systolic anterior motion or LVOT gradient was seen on his previous echocardiograms.  If he does have genetic  hypertrophic cardiomyopathy he has a very benign variant.  No evidence of atrial fibrillation on his monitor. 3. HyperTG: Minimal elevation in triglycerides does not require specific therapy.  Avoid sugars and starches and increase intake of low saturated fat from fatty fish, nuts and oils. 4. Cerebral small vessel disease: I think he makes a good point of it may be a familial pattern of vascular dementia and stroke related to small vessel disease.  Other than the minimal abnormality in his triglyceride level he does not have any of the conventional risk factors such as diabetes, hypertension or hypercholesterolemia and does not have signs or symptoms of sleep apnea.  Suggested more purposeful and frequent physical activity.  Suggested possible referral for genetic testing with Dr. Arlie Solomons.    Medication Adjustments/Labs and Tests Ordered: Current medicines are reviewed at length with the patient today.  Concerns regarding medicines are outlined above.  Medication changes, Labs and Tests ordered today are listed in the Patient Instructions below. There are no Patient Instructions on file for this visit.   Signed, Sanda Klein, MD  02/16/2020 4:18 PM    [facility] [place], [place], [place]  [address] Phone: [phone]; Fax: [phone]

## 2020-02-16 NOTE — Patient Instructions (Signed)
Medication Instructions:  No chnages *If you need a refill on your cardiac medications before your next appointment, please call your pharmacy*   Lab Work: None ordered If you have labs (blood work) drawn today and your tests are completely normal, you will receive your results only by: MyChart Message (if you have MyChart) OR A paper copy in the mail If you have any lab test that is abnormal or we need to change your treatment, we will call you to review the results.   Testing/Procedures: None ordered   Follow-Up: At [facility], you and your health needs are our priority.  As part of our continuing mission to provide you with exceptional heart care, we have created designated Provider Care Teams.  These Care Teams include your primary Cardiologist (physician) and Advanced Practice Providers (APPs -  Physician Assistants and Nurse Practitioners) who all work together to provide you with the care you need, when you need it.  We recommend signing up for the patient portal called "MyChart".  Sign up information is provided on this After Visit Summary.  MyChart is used to connect with patients for Virtual Visits (Telemedicine).  Patients are able to view lab/test results, encounter notes, upcoming appointments, etc.  Non-urgent messages can be sent to your provider as well.   To learn more about what you can do with MyChart, go to [email or web address].    Your next appointment:   12 month(s)  The format for your next appointment:   In Person  Provider:   You may see Mihai Croitoru, MD or one of the following Advanced Practice Providers on your designated Care Team:   Hao Meng, PA-C Angela Duke, PA-C or  Krista Kroeger, PA-C   

## 2020-02-17 ENCOUNTER — Ambulatory Visit: Payer: Medicare PPO | Admitting: Occupational Therapy

## 2020-02-21 ENCOUNTER — Encounter: Payer: Self-pay | Admitting: Occupational Therapy

## 2020-02-21 ENCOUNTER — Ambulatory Visit: Payer: Medicare PPO | Admitting: Occupational Therapy

## 2020-02-21 ENCOUNTER — Other Ambulatory Visit: Payer: Self-pay

## 2020-02-21 DIAGNOSIS — M79602 Pain in left arm: Secondary | ICD-10-CM | POA: Diagnosis not present

## 2020-02-21 DIAGNOSIS — M6281 Muscle weakness (generalized): Secondary | ICD-10-CM | POA: Diagnosis not present

## 2020-02-21 DIAGNOSIS — G8929 Other chronic pain: Secondary | ICD-10-CM

## 2020-02-21 DIAGNOSIS — M25511 Pain in right shoulder: Secondary | ICD-10-CM | POA: Diagnosis not present

## 2020-02-21 DIAGNOSIS — R208 Other disturbances of skin sensation: Secondary | ICD-10-CM | POA: Diagnosis not present

## 2020-02-21 DIAGNOSIS — R278 Other lack of coordination: Secondary | ICD-10-CM

## 2020-02-21 DIAGNOSIS — M79642 Pain in left hand: Secondary | ICD-10-CM

## 2020-02-21 DIAGNOSIS — M25642 Stiffness of left hand, not elsewhere classified: Secondary | ICD-10-CM

## 2020-02-21 NOTE — Therapy (Signed)
[facility] [address] [place], [place], [address] Phone: [phone]   Fax:  [phone]  Occupational Therapy Treatment  Patient Details  Name: Marc Thornton MRN: [id number] Date of Birth: [birth year] Referring Provider (OT): Dr Krista Blue   Encounter Date: 02/21/2020   OT End of Session - 02/21/20 1619    Visit Number 3    Number of Visits 17    Date for OT Re-Evaluation 04/25/20    Authorization Type [employer]    Authorization - Visit Number 3    Authorization - Number of Visits 10    Progress Note Due on Visit 10    OT Start Time 1618    OT Stop Time 1700    OT Time Calculation (min) 42 min    Activity Tolerance Patient tolerated treatment well    Behavior During Therapy [facility] for tasks assessed/performed           Past Medical History:  Diagnosis Date  . Arthritis   . BPH (benign prostatic hyperplasia)   . Cardiomyopathy ([place])   . Chicken pox   . 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 DDD (degenerative disc disease), cervical   . Depression   . Elevated PSA   . Fracture of foot bone, right, closed 1964  . GERD (gastroesophageal reflux disease)   . Heart murmur   . Hyperlipidemia   . Polyneuropathy   . Rheumatic fever   . Seasonal allergies     Past Surgical History:  Procedure Laterality Date  . COLONOSCOPY     2006 normal exam  . PILONIDAL CYST EXCISION  1966  . WISDOM TOOTH EXTRACTION      There were no vitals filed for this visit.   Subjective Assessment - 02/21/20 1619    Subjective  "I felt terrible about missing our appointment". Pt reports sleeping better and that the exercises are making it better.    Currently in Pain? Yes    Pain Score 3     Pain Location Hand    Pain Orientation Left    Pain Descriptors / Indicators Numbness    Pain Type Chronic pain    Pain Onset More than a month ago    Pain Frequency Constant                        OT  Treatments/Exercises (OP) - 02/21/20 1622      ADLs   ADL Comments reviewed sleeping positions. Pt most comfortable sleeping in chair secondary to limited cervical ROM. Pt says he is sleeping with arms propped up when in sidelying but it has not been sucessful      Wrist Exercises   Other wrist exercises tendon glides in LUE    Other wrist exercises wheel and forearm gym with LUE      Modalities   Modalities Fluidotherapy      LUE Fluidotherapy   Number Minutes Fluidotherapy 8 Minutes    LUE Fluidotherapy Location Hand;Wrist    Comments pain relief and prior to exercises for stiffness                  OT Education - 02/21/20 1655    Education Details issued median nerve glides - gave handout.    Person(s) Educated Patient    Methods Explanation;Handout;Demonstration    Comprehension Verbalized understanding;Returned demonstration            OT Short Term Goals -  02/15/20 1651      OT SHORT TERM GOAL #1   Title Patient will complete HEP to address hand range of motion    Time 4    Period Weeks    Status On-going    Target Date 03/26/20      OT SHORT TERM GOAL #2   Title Patient will report ability to sleep for longer periods of time due to decreased shoulder pain    Time 4    Period Weeks    Status New      OT SHORT TERM GOAL #3   Title Patient will demonstrate 5 lb increase in grip strength in left hand to aide in grip on hand tools.    Time 4    Period Weeks    Status New             OT Long Term Goals - 02/10/20 1811      OT LONG TERM GOAL #1   Title Patient will complete updated HEP to address proximal strength in BUE    Time 8    Period Weeks    Status New    Target Date 04/25/20      OT LONG TERM GOAL #2   Title Patient will self report decreased time to button shirts using BUE method    Time 8    Period Weeks    Status New      OT LONG TERM GOAL #3   Title Patient will demonstrate 3 second reduction in 9 hole peg test left UE     Time 8    Period Weeks    Status New      OT LONG TERM GOAL #4   Title Patient will report improved comfort and ability to maintain hands on steering wheel while driving for 10 min or greater.    Time 8    Period Weeks    Status New                 Plan - 02/21/20 1800    Clinical Impression Statement Pt continues to experience discomfort in LUE but is progressing towards goals. Reports increase in sleeping time and decrease in pain w HEP    OT Occupational Profile and History Problem Focused Assessment - Including review of records relating to presenting problem    Occupational performance deficits (Please refer to evaluation for details): IADL's;Rest and Sleep;Leisure    Body Structure / Function / Physical Skills ADL;Coordination;GMC;UE functional use;Sensation;Pain;IADL;Flexibility;Decreased knowledge of use of DME;Dexterity;FMC;Improper spinal/pelvic alignment;Strength;ROM    Rehab Potential Good    Clinical Decision Making Limited treatment options, no task modification necessary    Comorbidities Affecting Occupational Performance: None    Modification or Assistance to Complete Evaluation  No modification of tasks or assist necessary to complete eval    OT Frequency 2x / week    OT Duration 8 weeks    OT Treatment/Interventions Self-care/ADL training;Therapeutic exercise;Aquatic Therapy;Moist Heat;Neuromuscular education;Splinting;Patient/family education;Therapeutic activities;Fluidtherapy;Passive range of motion;Manual Therapy;DME and/or AE instruction;Cryotherapy;Ultrasound    Plan continue progressing towards goals    Consulted and Agree with Plan of Care Patient           Patient will benefit from skilled therapeutic intervention in order to improve the following deficits and impairments:   Body Structure / Function / Physical Skills: ADL, Coordination, GMC, UE functional use, Sensation, Pain, IADL, Flexibility, Decreased knowledge of use of DME, Dexterity, FMC,  Improper spinal/pelvic alignment, Strength, ROM  Visit Diagnosis: Pain in left hand  Pain in left arm  Chronic right shoulder pain  Stiffness of left hand, not elsewhere classified  Other lack of coordination  Muscle weakness (generalized)  Other disturbances of skin sensation    Problem List Patient Active Problem List   Diagnosis Date Noted  . Midline low back pain without sciatica 09/30/2019  . Atrophy of muscle of right lower leg 09/13/2019  . Lumbar radiculopathy 09/13/2019  . Peripheral neuropathy 09/13/2019  . Gait abnormality 08/19/2019  . Paresthesia 08/19/2019  . Neck pain 08/19/2019  . Memory loss 08/19/2019  . Chronic diastolic heart failure ([place]) 03/21/2016  . Murmur, cardiac 01/15/2016  . Dyspnea 01/15/2016  . Abnormal ECG 01/15/2016  . Hypertriglyceridemia 01/15/2016    Zachery Conch MOT, OTR/L  02/21/2020, 6:02 PM  [facility] [address] [place], [place], [address] Phone: [phone]   Fax:  [phone]  Name: THERIN VETSCH MRN: [id number] Date of Birth: [birth year]

## 2020-02-24 ENCOUNTER — Other Ambulatory Visit: Payer: Self-pay

## 2020-02-24 ENCOUNTER — Encounter: Payer: Self-pay | Admitting: Occupational Therapy

## 2020-02-24 ENCOUNTER — Ambulatory Visit: Payer: Medicare PPO | Admitting: Occupational Therapy

## 2020-02-24 DIAGNOSIS — G8929 Other chronic pain: Secondary | ICD-10-CM | POA: Diagnosis not present

## 2020-02-24 DIAGNOSIS — R278 Other lack of coordination: Secondary | ICD-10-CM

## 2020-02-24 DIAGNOSIS — M79642 Pain in left hand: Secondary | ICD-10-CM

## 2020-02-24 DIAGNOSIS — M25642 Stiffness of left hand, not elsewhere classified: Secondary | ICD-10-CM

## 2020-02-24 DIAGNOSIS — M6281 Muscle weakness (generalized): Secondary | ICD-10-CM

## 2020-02-24 DIAGNOSIS — M79602 Pain in left arm: Secondary | ICD-10-CM | POA: Diagnosis not present

## 2020-02-24 DIAGNOSIS — R208 Other disturbances of skin sensation: Secondary | ICD-10-CM

## 2020-02-24 DIAGNOSIS — M25511 Pain in right shoulder: Secondary | ICD-10-CM | POA: Diagnosis not present

## 2020-02-24 NOTE — Therapy (Signed)
[facility] [address] [place], [place], [address] Phone: [phone]   Fax:  [phone]  Occupational Therapy Treatment  Patient Details  Name: Marc Thornton MRN: [id number] Date of Birth: Apr 12, 1945 Referring Provider (OT): Dr Krista Blue   Encounter Date: 02/24/2020   OT End of Session - 02/24/20 1749    Visit Number 4    Number of Visits 17    Date for OT Re-Evaluation 04/25/20    Authorization Type [employer]    Authorization - Visit Number 4    Authorization - Number of Visits 10    Progress Note Due on Visit 10    OT Start Time 1746    OT Stop Time 1826    OT Time Calculation (min) 40 min    Activity Tolerance Patient tolerated treatment well    Behavior During Therapy [facility] for tasks assessed/performed           Past Medical History:  Diagnosis Date  . Arthritis   . BPH (benign prostatic hyperplasia)   . Cardiomyopathy ([place])   . Chicken pox   . 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 DDD (degenerative disc disease), cervical   . Depression   . Elevated PSA   . Fracture of foot bone, right, closed 1964  . GERD (gastroesophageal reflux disease)   . Heart murmur   . Hyperlipidemia   . Polyneuropathy   . Rheumatic fever   . Seasonal allergies     Past Surgical History:  Procedure Laterality Date  . COLONOSCOPY     2006 normal exam  . PILONIDAL CYST EXCISION  1966  . WISDOM TOOTH EXTRACTION      There were no vitals filed for this visit.   Subjective Assessment - 02/24/20 1750    Subjective  "I'm feelinga little bit more numbness today. Did a lot of work with pruning sheers yesterday in my yard."    Currently in Pain? Yes    Pain Score 5     Pain Location Hand    Pain Orientation Left    Pain Descriptors / Indicators Numbness    Pain Type Chronic pain    Pain Onset More than a month ago    Pain Frequency Constant                        OT Treatments/Exercises (OP)  - 02/24/20 1820      Exercises   Exercises Neck      Neck Exercises: Stretches   Other Neck Stretches AROM for neck secondary to impaired range of motion and trigger points increasing pain and sensation deficits in LUE hand      Wrist Exercises   Other wrist exercises Median Nerve education and stretches      Modalities   Modalities Moist Heat      Moist Heat Therapy   Number Minutes Moist Heat 7 Minutes    Moist Heat Location Cervical      Manual Therapy   Manual Therapy Soft tissue mobilization    Soft tissue mobilization neck - levator, trap left           Moist Heat applied during AROM for neck.        OT Education - 02/24/20 1823    Education Details see pt instructions Neck AROM    Person(s) Educated Patient    Methods Explanation;Handout;Demonstration    Comprehension Verbalized understanding;Returned demonstration  OT Short Term Goals - 02/15/20 1651      OT SHORT TERM GOAL #1   Title Patient will complete HEP to address hand range of motion    Time 4    Period Weeks    Status On-going    Target Date 03/26/20      OT SHORT TERM GOAL #2   Title Patient will report ability to sleep for longer periods of time due to decreased shoulder pain    Time 4    Period Weeks    Status New      OT SHORT TERM GOAL #3   Title Patient will demonstrate 5 lb increase in grip strength in left hand to aide in grip on hand tools.    Time 4    Period Weeks    Status New             OT Long Term Goals - 02/10/20 1811      OT LONG TERM GOAL #1   Title Patient will complete updated HEP to address proximal strength in BUE    Time 8    Period Weeks    Status New    Target Date 04/25/20      OT LONG TERM GOAL #2   Title Patient will self report decreased time to button shirts using BUE method    Time 8    Period Weeks    Status New      OT LONG TERM GOAL #3   Title Patient will demonstrate 3 second reduction in 9 hole peg test left UE    Time  8    Period Weeks    Status New      OT LONG TERM GOAL #4   Title Patient will report improved comfort and ability to maintain hands on steering wheel while driving for 10 min or greater.    Time 8    Period Weeks    Status New                  Patient will benefit from skilled therapeutic intervention in order to improve the following deficits and impairments:           Visit Diagnosis: Pain in left hand  Pain in left arm  Stiffness of left hand, not elsewhere classified  Other lack of coordination  Chronic right shoulder pain  Muscle weakness (generalized)  Other disturbances of skin sensation    Problem List Patient Active Problem List   Diagnosis Date Noted  . Midline low back pain without sciatica 09/30/2019  . Atrophy of muscle of right lower leg 09/13/2019  . Lumbar radiculopathy 09/13/2019  . Peripheral neuropathy 09/13/2019  . Gait abnormality 08/19/2019  . Paresthesia 08/19/2019  . Neck pain 08/19/2019  . Memory loss 08/19/2019  . Chronic diastolic heart failure ([place]) 03/21/2016  . Murmur, cardiac 01/15/2016  . Dyspnea 01/15/2016  . Abnormal ECG 01/15/2016  . Hypertriglyceridemia 01/15/2016    Zachery Conch MOT, OTR/L  02/24/2020, 6:25 PM  [place] [address] [place], [place], [address] Phone: [phone]   Fax:  [phone]  Name: Marc Thornton MRN: [id number] Date of Birth: 1946/02/05

## 2020-02-24 NOTE — Patient Instructions (Addendum)
Seated Stretch: Neck to Side    Facing forward with shoulders relaxed, make a double chin and drop ear to one shoulder. Return. Repeat to other side. DO NOT RAISE SHOULDERS. Repeat  5  Times both sides. Do _2 sessions per day.  http://gt2.exer.us/792   Active Neck Rotation    With head in a comfortable position and chin gently tucked in, rotate head to the right. Hold _5_ seconds. Repeat to the left. Repeat 5 times each side. Do _2___ sessions per day.  http://gt2.exer.us/10   Copyright  VHI. All rights reserved.    Copyright  VHI. All rights reserved.

## 2020-02-29 ENCOUNTER — Ambulatory Visit: Payer: Medicare PPO | Admitting: Occupational Therapy

## 2020-02-29 ENCOUNTER — Encounter: Payer: Self-pay | Admitting: Occupational Therapy

## 2020-02-29 ENCOUNTER — Other Ambulatory Visit: Payer: Self-pay

## 2020-02-29 DIAGNOSIS — M25642 Stiffness of left hand, not elsewhere classified: Secondary | ICD-10-CM

## 2020-02-29 DIAGNOSIS — M25511 Pain in right shoulder: Secondary | ICD-10-CM

## 2020-02-29 DIAGNOSIS — R208 Other disturbances of skin sensation: Secondary | ICD-10-CM

## 2020-02-29 DIAGNOSIS — M6281 Muscle weakness (generalized): Secondary | ICD-10-CM | POA: Diagnosis not present

## 2020-02-29 DIAGNOSIS — G8929 Other chronic pain: Secondary | ICD-10-CM

## 2020-02-29 DIAGNOSIS — M79602 Pain in left arm: Secondary | ICD-10-CM

## 2020-02-29 DIAGNOSIS — M79642 Pain in left hand: Secondary | ICD-10-CM | POA: Diagnosis not present

## 2020-02-29 DIAGNOSIS — R278 Other lack of coordination: Secondary | ICD-10-CM | POA: Diagnosis not present

## 2020-02-29 NOTE — Therapy (Signed)
[facility] [address] [place], [place], [address] Phone: [phone]   Fax:  [phone]  Occupational Therapy Treatment  Patient Details  Name: Marc Thornton MRN: [id number] Date of Birth: 01/02/1946 Referring Provider (OT): Dr Yan   Encounter Date: 02/29/2020   OT End of Session - 02/29/20 1703    Visit Number 5    Number of Visits 17    Date for OT Re-Evaluation 04/25/20    Authorization Type [employer]    Authorization - Visit Number 5    Authorization - Number of Visits 10    Progress Note Due on Visit 10    OT Start Time 1540    OT Stop Time 1640    OT Time Calculation (min) 60 min    Activity Tolerance Patient tolerated treatment well    Behavior During Therapy WFL for tasks assessed/performed           Past Medical History:  Diagnosis Date  . Arthritis   . BPH (benign prostatic hyperplasia)   . Cardiomyopathy (HCC)   . Chicken pox   . 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 DDD (degenerative disc disease), cervical   . Depression   . Elevated PSA   . Fracture of foot bone, right, closed 1964  . GERD (gastroesophageal reflux disease)   . Heart murmur   . Hyperlipidemia   . Polyneuropathy   . Rheumatic fever   . Seasonal allergies     Past Surgical History:  Procedure Laterality Date  . COLONOSCOPY     2006 normal exam  . PILONIDAL CYST EXCISION  1966  . WISDOM TOOTH EXTRACTION      There were no vitals filed for this visit.   Subjective Assessment - 02/29/20 1557    Subjective  I take beet root extracts as an exercise enhancer.  I take it inconsistently    Currently in Pain? Yes    Pain Score 3     Pain Location Neck    Pain Orientation Left    Pain Descriptors / Indicators Aching    Pain Type Chronic pain    Pain Onset More than a month ago              [facility] OT Assessment - 02/29/20 0001      Hand Function   Right Hand Grip (lbs) 95    Left Hand Grip (lbs)  90                    OT Treatments/Exercises (OP) - 02/29/20 0001      ADLs   Work Reviewed exercise program and activity adaptation to help patient avoid exacerbating symptoms.      ADL Comments Reviewed progress toward short term goals.  Reviewed long term goals.  Discussed possibility of seeking a PT referral to address neck issues.  Patient in agreement.  Sent message to patient's neurologist.        Fine Motor Coordination (Hand/Wrist)   Fine Motor Coordination Grooved pegs;In hand manipuation training    In Hand Manipulation Training worked on moving objects from radial to ulnar side of hand.                    OT Education - 02/29/20 1703    Education Details reviewed hand exercises (nerve gldes) and reviewed activity modifications    Person(s) Educated Patient    Methods Explanation;Handout    Comprehension Verbalized   understanding            OT Short Term Goals - 02/29/20 1606      OT SHORT TERM GOAL #1   Title Patient will complete HEP to address hand range of motion    Time 4    Period Weeks    Status Achieved      OT SHORT TERM GOAL #2   Time 4    Period Weeks    Status Achieved      OT SHORT TERM GOAL #3   Title Patient will demonstrate 5 lb increase in grip strength in left hand to aide in grip on hand tools.    Period Weeks    Status Achieved             OT Long Term Goals - 02/29/20 1625      OT LONG TERM GOAL #1   Title Patient will complete updated HEP to address proximal strength in BUE    Time 8    Period Weeks    Status On-going      OT LONG TERM GOAL #2   Title Patient will self report decreased time to button shirts using BUE method    Time 8    Period Weeks    Status On-going      OT LONG TERM GOAL #3   Title Patient will demonstrate 3 second reduction in 9 hole peg test left UE    Time 8    Period Weeks    Status On-going      OT LONG TERM GOAL #4   Title Patient will report improved comfort and ability  to maintain hands on steering wheel while driving for 10 min or greater.    Time 8    Period Weeks    Status On-going                 Plan - 02/29/20 1704    Clinical Impression Statement Patient with reduced hand pain and improved hand strength.  Seeking PT referral to address neck.  Patien thas met all short term goals and is working toward long term OT goals    OT Occupational Profile and History Problem Focused Assessment - Including review of records relating to presenting problem    Occupational performance deficits (Please refer to evaluation for details): IADL's;Rest and Sleep;Leisure    Body Structure / Function / Physical Skills ADL;Coordination;GMC;UE functional use;Sensation;Pain;IADL;Flexibility;Decreased knowledge of use of DME;Dexterity;FMC;Improper spinal/pelvic alignment;Strength;ROM    Rehab Potential Good    Clinical Decision Making Limited treatment options, no task modification necessary    Comorbidities Affecting Occupational Performance: None    Modification or Assistance to Complete Evaluation  No modification of tasks or assist necessary to complete eval    OT Frequency 2x / week    OT Duration 8 weeks    OT Treatment/Interventions Self-care/ADL training;Therapeutic exercise;Aquatic Therapy;Moist Heat;Neuromuscular education;Splinting;Patient/family education;Therapeutic activities;Fluidtherapy;Passive range of motion;Manual Therapy;DME and/or AE instruction;Cryotherapy;Ultrasound    Plan proximal range and strengthening, hand coordination HEP    Consulted and Agree with Plan of Care Patient           Patient will benefit from skilled therapeutic intervention in order to improve the following deficits and impairments:   Body Structure / Function / Physical Skills: ADL, Coordination, GMC, UE functional use, Sensation, Pain, IADL, Flexibility, Decreased knowledge of use of DME, Dexterity, FMC, Improper spinal/pelvic alignment, Strength, ROM       Visit  Diagnosis: Pain in left hand    Pain in left arm  Stiffness of left hand, not elsewhere classified  Other lack of coordination  Chronic right shoulder pain  Muscle weakness (generalized)  Other disturbances of skin sensation    Problem List Patient Active Problem List   Diagnosis Date Noted  . Midline low back pain without sciatica 09/30/2019  . Atrophy of muscle of right lower leg 09/13/2019  . Lumbar radiculopathy 09/13/2019  . Peripheral neuropathy 09/13/2019  . Gait abnormality 08/19/2019  . Paresthesia 08/19/2019  . Neck pain 08/19/2019  . Memory loss 08/19/2019  . Chronic diastolic heart failure ([place]) 03/21/2016  . Murmur, cardiac 01/15/2016  . Dyspnea 01/15/2016  . Abnormal ECG 01/15/2016  . Hypertriglyceridemia 01/15/2016    Mariah Milling, OTR/L 02/29/2020, 5:07 PM  [place] [address] [place], [place], [address] Phone: [phone]   Fax:  [phone]  Name: KAVEH KISSINGER MRN: [id number] Date of Birth: 10/31/1945

## 2020-02-29 NOTE — Progress Notes (Signed)
Subjective:    Patient ID: Marc Thornton, male    DOB: Feb 05, 1946, 74 y.o.   MRN: [id number]  HPI Patient presents for yearly preventative medicine examination. This is a 74 year old male who  has a past medical history of Arthritis, BPH (benign prostatic hyperplasia), Cardiomyopathy ([place]), Chicken pox, Clotting disorder ([place]), DDD (degenerative disc disease), cervical, Depression, Elevated PSA, Fracture of foot bone, right, closed (1964), GERD (gastroesophageal reflux disease), Heart murmur, Hyperlipidemia, Polyneuropathy, Rheumatic fever, and Seasonal allergies.  CHF -was diagnosed in 2017 by cardiology.  He had an echo done in October 2017 which was essentially normal except for mild diastolic dysfunction.  He had a follow-up echocardiogram in September 2020 which showed no meaningful change from 2017.  At this time he had an event monitor which showed frequent PVCs and occasional brief paroxysmal atrial tachycardia but no evidence of VT or atrial fibrillation.   Elevated Triglycerides -mild triglyceride elevation in the past that did not require medication therapy. Lab Results  Component Value Date   CHOL 167 07/07/2019   HDL 37.90 (L) 07/07/2019   LDLCALC 94 07/07/2019   TRIG 175.0 (H) 07/07/2019   CHOLHDL 4 07/07/2019   Carpel Tunnel Syndrome -currently receiving the patient will therapy for this and feels as though his symptoms are improving  Memory Loss -was seen by neurology for this in May 2021.  At this time his MoCA was 30 out of 30.  He has a strong family history of dementia, father, paternal grandfather, paternal uncle.  His MRI of the brain in June 2021 showed 1.   Minimal generalized cortical atrophy, typical for age. 2.   T2/FLAIR hyperintense foci in the hemispheres consistent with mild chronic microvascular ischemic change, typical for age 74.   There were no acute findings.  He is questioning this diagnosis today and believes that there is something more going on.  He is  wondering what the next steps would be.  All immunizations and health maintenance protocols were reviewed with the patient and needed orders were placed.  Appropriate screening laboratory values were ordered for the patient including screening of hyperlipidemia, renal function and hepatic function.  Medication reconciliation,  past medical history, social history, problem list and allergies were reviewed in detail with the patient  Goals were established with regard to weight loss, exercise, and  diet in compliance with medications.  He is walking daily with his dog but diet continues to be an issue as he eats a diet high in carbs Wt Readings from Last 3 Encounters:  03/01/20 192 lb 12.8 oz (87.5 kg)  02/16/20 195 lb (88.5 kg)  11/22/19 186 lb (84.4 kg)   End of life planning was discussed.  Review of Systems  Constitutional: Negative.   HENT: Negative.   Eyes: Negative.   Respiratory: Negative.   Cardiovascular: Negative.   Gastrointestinal: Negative.   Endocrine: Negative.   Genitourinary: Negative.   Musculoskeletal: Negative.   Skin: Negative.   Allergic/Immunologic: Negative.   Neurological: Negative.   Hematological: Negative.   Psychiatric/Behavioral: Negative.   All other systems reviewed and are negative.  Past Medical History:  Diagnosis Date   Arthritis    BPH (benign prostatic hyperplasia)    Cardiomyopathy ([place])    Chicken pox    Clotting disorder (HCC)    low platelets   DDD (degenerative disc disease), cervical    Depression    Elevated PSA    Fracture of foot bone, right, closed 1964  GERD (gastroesophageal reflux disease)    Heart murmur    Hyperlipidemia    Polyneuropathy    Rheumatic fever    Seasonal allergies     Social History   Socioeconomic History   Marital status: Widowed    Spouse name: Not on file   Number of children: Not on file   Years of education: Not on file   Highest education level: Not on file   Occupational History   Not on file  Tobacco Use   Smoking status: Former Smoker   Smokeless tobacco: Former Systems developer  Substance and Sexual Activity   Alcohol use: Yes    Comment: occ.   Drug use: No   Sexual activity: Not on file  Other Topics Concern   Not on file  Social History Narrative   Worked as a Community education officer    Married      Social Determinants of Radio broadcast assistant Strain:    Difficulty of Paying Living Expenses: Not on file  Food Insecurity:    Worried About Charity fundraiser in the Last Year: Not on file   [employer] of Food in the Last Year: Not on file  Transportation Needs:    Lack of Transportation (Medical): Not on file   Lack of Transportation (Non-Medical): Not on file  Physical Activity:    Days of Exercise per Week: Not on file   Minutes of Exercise per Session: Not on file  Stress:    Feeling of Stress : Not on file  Social Connections:    Frequency of Communication with Friends and Family: Not on file   Frequency of Social Gatherings with Friends and Family: Not on file   Attends Religious Services: Not on file   Active Member of Clubs or Organizations: Not on file   Attends Archivist Meetings: Not on file   Marital Status: Not on file  Intimate Partner Violence:    Fear of Current or Ex-Partner: Not on file   Emotionally Abused: Not on file   Physically Abused: Not on file   Sexually Abused: Not on file    Past Surgical History:  Procedure Laterality Date   COLONOSCOPY     2006 normal exam   PILONIDAL CYST EXCISION  1966   WISDOM TOOTH EXTRACTION      Family History  Problem Relation Age of Onset   Arthritis Mother    Breast cancer Mother    Arthritis Father    Heart disease Father    Stroke Father    Hypertension Father    Prostate cancer Father    Dementia Father    Stroke Paternal Grandfather    Colon cancer Neg Hx    Esophageal cancer Neg Hx    Stomach cancer Neg Hx     Rectal cancer Neg Hx     No Known Allergies  Current Outpatient Medications on File Prior to Visit  Medication Sig Dispense Refill   acetaminophen (TYLENOL) 500 MG tablet Take 500 mg by mouth every 6 (six) hours as needed.     Betaine, Trimethylglycine, (TMG, TRIMETHYLGLYCINE, PO)      Cholecalciferol (VITAMIN D3) 25 MCG (1000 UT) CAPS Take by mouth. Takes 3-4 times a week     Multiple Vitamins-Minerals (CENTRUM MEN PO) Take by mouth. Takes 3-4 times a week     No current facility-administered medications on file prior to visit.    There were no vitals taken for this visit.  Objective:   Physical Exam Vitals and nursing note reviewed.  Constitutional:      General: He is not in acute distress.    Appearance: Normal appearance. He is well-developed and normal weight.  HENT:     Head: Normocephalic and atraumatic.     Right Ear: Tympanic membrane, ear canal and external ear normal. There is no impacted cerumen.     Left Ear: Tympanic membrane, ear canal and external ear normal. There is no impacted cerumen.     Nose: Nose normal. No congestion or rhinorrhea.     Mouth/Throat:     Mouth: Mucous membranes are moist.     Pharynx: Oropharynx is clear. No oropharyngeal exudate or posterior oropharyngeal erythema.  Eyes:     General:        Right eye: No discharge.        Left eye: No discharge.     Extraocular Movements: Extraocular movements intact.     Conjunctiva/sclera: Conjunctivae normal.     Pupils: Pupils are equal, round, and reactive to light.  Neck:     Vascular: No carotid bruit.     Trachea: No tracheal deviation.  Cardiovascular:     Rate and Rhythm: Normal rate and regular rhythm.     Pulses: Normal pulses.     Heart sounds: Murmur heard.  No friction rub. No gallop.   Pulmonary:     Effort: Pulmonary effort is normal. No respiratory distress.     Breath sounds: Normal breath sounds. No stridor. No wheezing, rhonchi or rales.  Chest:     Chest  wall: No tenderness.  Abdominal:     General: Bowel sounds are normal. There is no distension.     Palpations: Abdomen is soft. There is no mass.     Tenderness: There is no abdominal tenderness. There is no right CVA tenderness, left CVA tenderness, guarding or rebound.     Hernia: No hernia is present.  Musculoskeletal:        General: No swelling, tenderness, deformity or signs of injury. Normal range of motion.     Right lower leg: No edema.     Left lower leg: No edema.  Lymphadenopathy:     Cervical: No cervical adenopathy.  Skin:    General: Skin is warm and dry.     Capillary Refill: Capillary refill takes less than 2 seconds.     Coloration: Skin is not jaundiced or pale.     Findings: No bruising, erythema, lesion or rash.  Neurological:     General: No focal deficit present.     Mental Status: He is alert and oriented to person, place, and time.     Cranial Nerves: No cranial nerve deficit.     Sensory: No sensory deficit.     Motor: No weakness.     Coordination: Coordination normal.     Gait: Gait normal.     Deep Tendon Reflexes: Reflexes normal.  Psychiatric:        Mood and Affect: Mood normal.        Behavior: Behavior normal.        Thought Content: Thought content normal.        Judgment: Judgment normal.       Assessment & Plan:  1. Routine general medical examination at a health care facility -Encouraged to continue to exercise and work on heart healthy diet. -Follow-up in 1 year or sooner if needed - CBC with Differential/Platelet; Future - Lipid panel; Future -  TSH; Future - Hemoglobin A1c; Future - CMP with eGFR([facility]); Future - Lipid panel - TSH - Hemoglobin A1c - CMP with eGFR([facility]) - CBC with Differential/Platelet   2. Prostate cancer screening  - PSA; Future - PSA  3. Chronic diastolic heart failure ([place]) -Continue with cardiology plan of care - CBC with Differential/Platelet; Future - Lipid panel; Future - TSH; Future -  Hemoglobin A1c; Future - CMP with eGFR([facility]); Future - Lipid panel - TSH - Hemoglobin A1c - CMP with eGFR([facility]) - CBC with Differential/Platelet  4. Other polyneuropathy -Follow-up with neurology as directed  5. Memory loss -Condition completely normal in the office today.  Advised that I could send him to a different neurologist at [facility] or [facility] if he would like a second opinion but his MRI showed normal age-related issues.  He will think about this and let me know.  He is also interested in genetic testing-not sure that genetic testing would give him any more information - CBC with Differential/Platelet; Future - Lipid panel; Future - TSH; Future - Hemoglobin A1c; Future - CMP with eGFR([facility]); Future - Lipid panel - TSH - Hemoglobin A1c - CMP with eGFR([facility]) - CBC with Differential/Platelet   [name], NP

## 2020-03-01 ENCOUNTER — Ambulatory Visit (INDEPENDENT_AMBULATORY_CARE_PROVIDER_SITE_OTHER): Payer: Medicare PPO | Admitting: Adult Health

## 2020-03-01 ENCOUNTER — Other Ambulatory Visit: Payer: Self-pay

## 2020-03-01 ENCOUNTER — Telehealth: Payer: Self-pay | Admitting: Neurology

## 2020-03-01 ENCOUNTER — Encounter: Payer: Self-pay | Admitting: Adult Health

## 2020-03-01 VITALS — BP 122/70 | HR 53 | Temp 98.6°F | Resp 16 | Ht 72.5 in | Wt 192.8 lb

## 2020-03-01 DIAGNOSIS — R413 Other amnesia: Secondary | ICD-10-CM | POA: Diagnosis not present

## 2020-03-01 DIAGNOSIS — M542 Cervicalgia: Secondary | ICD-10-CM

## 2020-03-01 DIAGNOSIS — Z0000 Encounter for general adult medical examination without abnormal findings: Secondary | ICD-10-CM | POA: Diagnosis not present

## 2020-03-01 DIAGNOSIS — Z125 Encounter for screening for malignant neoplasm of prostate: Secondary | ICD-10-CM | POA: Diagnosis not present

## 2020-03-01 DIAGNOSIS — I5032 Chronic diastolic (congestive) heart failure: Secondary | ICD-10-CM

## 2020-03-01 DIAGNOSIS — R972 Elevated prostate specific antigen [PSA]: Secondary | ICD-10-CM | POA: Diagnosis not present

## 2020-03-01 DIAGNOSIS — G6289 Other specified polyneuropathies: Secondary | ICD-10-CM | POA: Diagnosis not present

## 2020-03-01 DIAGNOSIS — Z23 Encounter for immunization: Secondary | ICD-10-CM | POA: Diagnosis not present

## 2020-03-01 DIAGNOSIS — R202 Paresthesia of skin: Secondary | ICD-10-CM

## 2020-03-01 NOTE — Telephone Encounter (Signed)
Orders Placed This Encounter  Procedures  . Ambulatory referral to Physical Therapy      

## 2020-03-01 NOTE — Telephone Encounter (Signed)
-----   Message from Mariah Milling, OT sent at 02/29/2020  4:36 PM EST ----- Regarding: PT referral Dr Krista Blue, We are seeing Mr Giangrande for OT.  He has made some good improvement with hand motion, reduction in pain, and hand strength.  He still has numbness in his hands.   I do think he may benefit from a PT referral to address his neck issues.  Please refer if you agree.  We could certainly see him here at our clinic.   Thanks for this referral.   Antony Salmon, OTR/L 02/29/20  4:47 PM Phone: [phone] Fax: [phone]

## 2020-03-01 NOTE — Patient Instructions (Signed)
It was great seeing you today   We will follow up with you regarding your blood work   Please follow up with your specialists are directed   If you need anything please let me know

## 2020-03-02 ENCOUNTER — Encounter: Payer: Self-pay | Admitting: Adult Health

## 2020-03-02 LAB — HEMOGLOBIN A1C
Hgb A1c MFr Bld: 5.3 % of total Hgb (ref <5.7)
Mean Plasma Glucose: 105 (calc)
eAG (mmol/L): 5.8 (calc)

## 2020-03-02 LAB — CBC WITH DIFFERENTIAL/PLATELET
Absolute Monocytes: 524 cells/uL (ref 200–950)
Basophils Absolute: 51 cells/uL (ref 0–200)
Basophils Relative: 0.9 %
Eosinophils Absolute: 279 cells/uL (ref 15–500)
Eosinophils Relative: 4.9 %
HCT: 46.5 % (ref 38.5–50.0)
Hemoglobin: 15.8 g/dL (ref 13.2–17.1)
Lymphs Abs: 2611 cells/uL (ref 850–3900)
MCH: 31.6 pg (ref 27.0–33.0)
MCHC: 34 g/dL (ref 32.0–36.0)
MCV: 93 fL (ref 80.0–100.0)
MPV: 12.6 fL — ABNORMAL HIGH (ref 7.5–12.5)
Monocytes Relative: 9.2 %
Neutro Abs: 2234 cells/uL (ref 1500–7800)
Neutrophils Relative %: 39.2 %
Platelets: 158 10*3/uL (ref 140–400)
RBC: 5 10*6/uL (ref 4.20–5.80)
RDW: 12.2 % (ref 11.0–15.0)
Total Lymphocyte: 45.8 %
WBC: 5.7 10*3/uL (ref 3.8–10.8)

## 2020-03-02 LAB — COMPLETE METABOLIC PANEL WITH GFR
AG Ratio: 1.5 (calc) (ref 1.0–2.5)
ALT: 17 U/L (ref 9–46)
AST: 26 U/L (ref 10–35)
Albumin: 4.6 g/dL (ref 3.6–5.1)
Alkaline phosphatase (APISO): 63 U/L (ref 35–144)
BUN/Creatinine Ratio: 27 (calc) — ABNORMAL HIGH (ref 6–22)
BUN: 26 mg/dL — ABNORMAL HIGH (ref 7–25)
CO2: 28 mmol/L (ref 20–32)
Calcium: 10 mg/dL (ref 8.6–10.3)
Chloride: 106 mmol/L (ref 98–110)
Creat: 0.96 mg/dL (ref 0.70–1.18)
GFR, Est African American: 89 mL/min/{1.73_m2} (ref >=60)
GFR, Est Non African American: 77 mL/min/{1.73_m2} (ref >=60)
Globulin: 3 g/dL (calc) (ref 1.9–3.7)
Glucose, Bld: 89 mg/dL (ref 65–99)
Potassium: 5.3 mmol/L (ref 3.5–5.3)
Sodium: 140 mmol/L (ref 135–146)
Total Bilirubin: 0.9 mg/dL (ref 0.2–1.2)
Total Protein: 7.6 g/dL (ref 6.1–8.1)

## 2020-03-02 LAB — LIPID PANEL
Cholesterol: 172 mg/dL (ref <200)
HDL: 41 mg/dL (ref >=40)
LDL Cholesterol (Calc): 108 mg/dL (calc) — ABNORMAL HIGH
Non-HDL Cholesterol (Calc): 131 mg/dL (calc) — ABNORMAL HIGH (ref <130)
Total CHOL/HDL Ratio: 4.2 (calc) (ref <5.0)
Triglycerides: 120 mg/dL (ref <150)

## 2020-03-02 LAB — PSA: PSA: 3.44 ng/mL (ref <=4.0)

## 2020-03-02 LAB — TSH: TSH: 2.51 mIU/L (ref 0.40–4.50)

## 2020-03-07 ENCOUNTER — Encounter: Payer: Self-pay | Admitting: Occupational Therapy

## 2020-03-07 ENCOUNTER — Other Ambulatory Visit: Payer: Self-pay

## 2020-03-07 ENCOUNTER — Ambulatory Visit: Payer: Medicare PPO | Admitting: Occupational Therapy

## 2020-03-07 DIAGNOSIS — M6281 Muscle weakness (generalized): Secondary | ICD-10-CM | POA: Diagnosis not present

## 2020-03-07 DIAGNOSIS — R278 Other lack of coordination: Secondary | ICD-10-CM

## 2020-03-07 DIAGNOSIS — R208 Other disturbances of skin sensation: Secondary | ICD-10-CM

## 2020-03-07 DIAGNOSIS — M25511 Pain in right shoulder: Secondary | ICD-10-CM | POA: Diagnosis not present

## 2020-03-07 DIAGNOSIS — M79642 Pain in left hand: Secondary | ICD-10-CM | POA: Diagnosis not present

## 2020-03-07 DIAGNOSIS — M25642 Stiffness of left hand, not elsewhere classified: Secondary | ICD-10-CM | POA: Diagnosis not present

## 2020-03-07 DIAGNOSIS — G8929 Other chronic pain: Secondary | ICD-10-CM | POA: Diagnosis not present

## 2020-03-07 DIAGNOSIS — M79602 Pain in left arm: Secondary | ICD-10-CM

## 2020-03-07 NOTE — Therapy (Signed)
[facility] [address] [place], [place], [address] Phone: [phone]   Fax:  [phone]  Occupational Therapy Treatment  Patient Details  Name: Marc Thornton MRN: [id number] Date of Birth: 1946-02-23 Referring Provider (OT): Dr Krista Blue   Encounter Date: 03/07/2020   OT End of Session - 03/07/20 1841    Visit Number 6    Number of Visits 17    Date for OT Re-Evaluation 04/25/20    Authorization Type [employer]    Authorization - Visit Number 6    Authorization - Number of Visits 10    Progress Note Due on Visit 10    OT Start Time 1620    OT Stop Time 1705    OT Time Calculation (min) 45 min    Activity Tolerance Patient tolerated treatment well    Behavior During Therapy WFL for tasks assessed/performed           Past Medical History:  Diagnosis Date   Arthritis    BPH (benign prostatic hyperplasia)    Cardiomyopathy ([place])    Chicken pox    Clotting disorder ([place])    low platelets   DDD (degenerative disc disease), cervical    Depression    Elevated PSA    Fracture of foot bone, right, closed 1964   GERD (gastroesophageal reflux disease)    Heart murmur    Hyperlipidemia    Polyneuropathy    Rheumatic fever    Seasonal allergies     Past Surgical History:  Procedure Laterality Date   COLONOSCOPY     2006 normal exam   PILONIDAL CYST EXCISION  1966   WISDOM TOOTH EXTRACTION      There were no vitals filed for this visit.   Subjective Assessment - 03/07/20 1833    Subjective  Patient enjoyed the holidays with his family.  Patient cooked a [place] and stuffing!    Currently in Pain? Yes    Pain Score 1     Pain Location Hand    Pain Orientation Left    Pain Descriptors / Indicators Aching;Numbness    Pain Type Chronic pain    Pain Onset More than a month ago    Pain Frequency Constant    Aggravating Factors  excessive use    Pain Relieving Factors rest, gentle  exercise                        OT Treatments/Exercises (OP) - 03/07/20 0001      ADLs   Cooking Patient did a good deal of chopping and slicing in preparing his holiday meal.  Patient with increased discomfort on dorsal surface of forearm - seems muscular as well as increased stiffness and discomfort on volar surface.  Less able to make composite fist today.  Patient with discoloration at fingertips indicative of decreased circulation.      ADL Comments Patient spoke at length about his concern for developing (or having) vascular dementia.  Wants to prepare for his care in that event.  Wants to seek additional testing either genetic, or neuropsychological.  Sent request to Neuro and primary care provider.       Ultrasound   Ultrasound Location volar wrist, palm, digits    Ultrasound Parameters 5cm2, 40mhz, continuous, 0.8 w/cm2, x 8 min    Ultrasound Goals Edema;Pain      Manual Therapy   Soft tissue mobilization Self mobilization on massage ball, soft tissue mob, palm,  volar wrist, passive flexion of digits for composite fist.                      OT Short Term Goals - 03/07/20 1842      OT SHORT TERM GOAL #1   Title Patient will complete HEP to address hand range of motion    Time 4    Period Weeks    Status Achieved      OT SHORT TERM GOAL #2   Time 4    Period Weeks    Status Achieved      OT SHORT TERM GOAL #3   Title Patient will demonstrate 5 lb increase in grip strength in left hand to aide in grip on hand tools.    Period Weeks    Status Achieved             OT Long Term Goals - 03/07/20 1842      OT LONG TERM GOAL #1   Title Patient will complete updated HEP to address proximal strength in BUE    Time 8    Period Weeks    Status On-going      OT LONG TERM GOAL #2   Title Patient will self report decreased time to button shirts using BUE method    Time 8    Period Weeks    Status On-going      OT LONG TERM GOAL #3   Title  Patient will demonstrate 3 second reduction in 9 hole peg test left UE    Time 8    Period Weeks    Status On-going      OT LONG TERM GOAL #4   Title Patient will report improved comfort and ability to maintain hands on steering wheel while driving for 10 min or greater.    Time 8    Period Weeks    Status On-going                 Plan - 03/07/20 1841    Clinical Impression Statement Patient now with PT referral for neck pain.    OT Occupational Profile and History Problem Focused Assessment - Including review of records relating to presenting problem    Occupational performance deficits (Please refer to evaluation for details): IADL's;Rest and Sleep;Leisure    Body Structure / Function / Physical Skills ADL;Coordination;GMC;UE functional use;Sensation;Pain;IADL;Flexibility;Decreased knowledge of use of DME;Dexterity;FMC;Improper spinal/pelvic alignment;Strength;ROM    Rehab Potential Good    Clinical Decision Making Limited treatment options, no task modification necessary    Comorbidities Affecting Occupational Performance: None    Modification or Assistance to Complete Evaluation  No modification of tasks or assist necessary to complete eval    OT Frequency 2x / week    OT Duration 8 weeks    OT Treatment/Interventions Self-care/ADL training;Therapeutic exercise;Aquatic Therapy;Moist Heat;Neuromuscular education;Splinting;Patient/family education;Therapeutic activities;Fluidtherapy;Passive range of motion;Manual Therapy;DME and/or AE instruction;Cryotherapy;Ultrasound    Plan proximal range and strengthening, hand coordination HEP    Recommended Other Services Neuropsych    Consulted and Agree with Plan of Care Patient           Patient will benefit from skilled therapeutic intervention in order to improve the following deficits and impairments:   Body Structure / Function / Physical Skills: ADL, Coordination, GMC, UE functional use, Sensation, Pain, IADL, Flexibility,  Decreased knowledge of use of DME, Dexterity, FMC, Improper spinal/pelvic alignment, Strength, ROM       Visit Diagnosis: Pain in left hand  Pain in left arm  Stiffness of left hand, not elsewhere classified  Other lack of coordination  Chronic right shoulder pain  Muscle weakness (generalized)  Other disturbances of skin sensation    Problem List Patient Active Problem List   Diagnosis Date Noted   Midline low back pain without sciatica 09/30/2019   Atrophy of muscle of right lower leg 09/13/2019   Lumbar radiculopathy 09/13/2019   Peripheral neuropathy 09/13/2019   Gait abnormality 08/19/2019   Paresthesia 08/19/2019   Neck pain 08/19/2019   Memory loss 08/19/2019   Chronic diastolic heart failure ([place]) 03/21/2016   Murmur, cardiac 01/15/2016   Dyspnea 01/15/2016   Abnormal ECG 01/15/2016   Hypertriglyceridemia 01/15/2016    Marlowe Sax M,OTR/L 03/07/2020, 6:47 PM  [facility] [address] [place], [place], [address] Phone: [phone]   Fax:  [phone]  Name: KEVAL NAM MRN: [id number] Date of Birth: 1945/10/28

## 2020-03-09 ENCOUNTER — Ambulatory Visit: Payer: Medicare PPO | Attending: Neurology | Admitting: Occupational Therapy

## 2020-03-09 ENCOUNTER — Other Ambulatory Visit: Payer: Self-pay

## 2020-03-09 ENCOUNTER — Encounter: Payer: Self-pay | Admitting: Occupational Therapy

## 2020-03-09 DIAGNOSIS — R278 Other lack of coordination: Secondary | ICD-10-CM | POA: Diagnosis not present

## 2020-03-09 DIAGNOSIS — R293 Abnormal posture: Secondary | ICD-10-CM | POA: Diagnosis present

## 2020-03-09 DIAGNOSIS — M5413 Radiculopathy, cervicothoracic region: Secondary | ICD-10-CM | POA: Diagnosis present

## 2020-03-09 DIAGNOSIS — R208 Other disturbances of skin sensation: Secondary | ICD-10-CM | POA: Diagnosis not present

## 2020-03-09 DIAGNOSIS — M25642 Stiffness of left hand, not elsewhere classified: Secondary | ICD-10-CM | POA: Diagnosis not present

## 2020-03-09 DIAGNOSIS — G8929 Other chronic pain: Secondary | ICD-10-CM | POA: Diagnosis not present

## 2020-03-09 DIAGNOSIS — M79642 Pain in left hand: Secondary | ICD-10-CM | POA: Insufficient documentation

## 2020-03-09 DIAGNOSIS — M25511 Pain in right shoulder: Secondary | ICD-10-CM | POA: Diagnosis not present

## 2020-03-09 DIAGNOSIS — M79602 Pain in left arm: Secondary | ICD-10-CM | POA: Insufficient documentation

## 2020-03-09 DIAGNOSIS — M6281 Muscle weakness (generalized): Secondary | ICD-10-CM | POA: Diagnosis not present

## 2020-03-09 DIAGNOSIS — M542 Cervicalgia: Secondary | ICD-10-CM | POA: Diagnosis not present

## 2020-03-09 NOTE — Patient Instructions (Signed)
°  Coordination Activities  Perform the following activities for 20 minutes 2 times per day with left hand(s).   Rotate ball in fingertips (clockwise and counter-clockwise).  Toss ball between hands.  Toss ball in air and catch with the same hand.  Flip cards 1 at a time as fast as you can.  Deal cards with your thumb (Hold deck in hand and push card off top with thumb).  Rotate card in hand (clockwise and counter-clockwise).  Pick up coins, buttons, marbles, dried beans/pasta of different sizes and place in container.  Pick up coins and place in container or coin bank.  Pick up coins and stack.  Pick up coins one at a time until you get 5-10 in your hand, then move coins from palm to fingertips to stack one at a time.

## 2020-03-09 NOTE — Therapy (Signed)
[facility] [address] [place], [place], [address] Phone: [phone]   Fax:  [phone]  Occupational Therapy Treatment  Patient Details  Name: Marc Thornton MRN: [id number] Date of Birth: 1945-09-03 Referring Provider (OT): Dr Krista Blue   Encounter Date: 03/09/2020   OT End of Session - 03/09/20 1625    Visit Number 7    Number of Visits 17    Date for OT Re-Evaluation 04/25/20    Authorization Type [employer]    Authorization - Visit Number 7    Authorization - Number of Visits 10    Progress Note Due on Visit 10    OT Start Time 1622    OT Stop Time 1700    OT Time Calculation (min) 38 min    Activity Tolerance Patient tolerated treatment well    Behavior During Therapy [facility] for tasks assessed/performed           Past Medical History:  Diagnosis Date  . Arthritis   . BPH (benign prostatic hyperplasia)   . Cardiomyopathy ([place])   . Chicken pox   . 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 DDD (degenerative disc disease), cervical   . Depression   . Elevated PSA   . Fracture of foot bone, right, closed 1964  . GERD (gastroesophageal reflux disease)   . Heart murmur   . Hyperlipidemia   . Polyneuropathy   . Rheumatic fever   . Seasonal allergies     Past Surgical History:  Procedure Laterality Date  . COLONOSCOPY     2006 normal exam  . PILONIDAL CYST EXCISION  1966  . WISDOM TOOTH EXTRACTION      There were no vitals filed for this visit.   Subjective Assessment - 03/09/20 1625    Subjective  Pt reports "I think I over did it after the ultrasound cause it was feeling so good and not feeling too good" Pt reports he took something and has been resting today and pain is minimal (1/10)    Currently in Pain? Yes    Pain Score 1     Pain Location Wrist    Pain Orientation Left    Pain Descriptors / Indicators Aching;Numbness    Pain Type Chronic pain    Pain Onset More than a month ago     Pain Frequency Constant    Aggravating Factors  excessive use    Pain Relieving Factors rest and gentle exercise                        OT Treatments/Exercises (OP) - 03/09/20 1648      Ultrasound   Ultrasound Location LUE volar wrist primarily, palm of hand     Ultrasound Parameters 5cm2, 33mhz, continuous, 0.8 w/cm2, 8 minutes    Ultrasound Goals Pain                  OT Education - 03/09/20 1645    Education Details Coordination HEP - did not issue for home this day secondary to frustration with tasks.    Person(s) Educated Patient    Methods Explanation;Handout;Demonstration    Comprehension Verbalized understanding;Returned demonstration            OT Short Term Goals - 03/07/20 1842      OT SHORT TERM GOAL #1   Title Patient will complete HEP to address hand range of motion    Time 4  Period Weeks    Status Achieved      OT SHORT TERM GOAL #2   Time 4    Period Weeks    Status Achieved      OT SHORT TERM GOAL #3   Title Patient will demonstrate 5 lb increase in grip strength in left hand to aide in grip on hand tools.    Period Weeks    Status Achieved             OT Long Term Goals - 03/07/20 1842      OT LONG TERM GOAL #1   Title Patient will complete updated HEP to address proximal strength in BUE    Time 8    Period Weeks    Status On-going      OT LONG TERM GOAL #2   Title Patient will self report decreased time to button shirts using BUE method    Time 8    Period Weeks    Status On-going      OT LONG TERM GOAL #3   Title Patient will demonstrate 3 second reduction in 9 hole peg test left UE    Time 8    Period Weeks    Status On-going      OT LONG TERM GOAL #4   Title Patient will report improved comfort and ability to maintain hands on steering wheel while driving for 10 min or greater.    Time 8    Period Weeks    Status On-going                 Plan - 03/09/20 1652    Clinical Impression  Statement Pt progressing towards goals. Pt scheduled for PT evaluation 12/14 for neck pain    OT Occupational Profile and History Problem Focused Assessment - Including review of records relating to presenting problem    Occupational performance deficits (Please refer to evaluation for details): IADL's;Rest and Sleep;Leisure    Body Structure / Function / Physical Skills ADL;Coordination;GMC;UE functional use;Sensation;Pain;IADL;Flexibility;Decreased knowledge of use of DME;Dexterity;FMC;Improper spinal/pelvic alignment;Strength;ROM    Rehab Potential Good    Clinical Decision Making Limited treatment options, no task modification necessary    Comorbidities Affecting Occupational Performance: None    Modification or Assistance to Complete Evaluation  No modification of tasks or assist necessary to complete eval    OT Frequency 2x / week    OT Duration 8 weeks    OT Treatment/Interventions Self-care/ADL training;Therapeutic exercise;Aquatic Therapy;Moist Heat;Neuromuscular education;Splinting;Patient/family education;Therapeutic activities;Fluidtherapy;Passive range of motion;Manual Therapy;DME and/or AE instruction;Cryotherapy;Ultrasound    Plan more coordination activities    Recommended Other Services Neuropsych    Consulted and Agree with Plan of Care Patient           Patient will benefit from skilled therapeutic intervention in order to improve the following deficits and impairments:   Body Structure / Function / Physical Skills: ADL, Coordination, GMC, UE functional use, Sensation, Pain, IADL, Flexibility, Decreased knowledge of use of DME, Dexterity, FMC, Improper spinal/pelvic alignment, Strength, ROM       Visit Diagnosis: Pain in left hand  Pain in left arm  Stiffness of left hand, not elsewhere classified  Other lack of coordination  Chronic right shoulder pain  Muscle weakness (generalized)  Other disturbances of skin sensation    Problem List Patient Active  Problem List   Diagnosis Date Noted  . Midline low back pain without sciatica 09/30/2019  . Atrophy of muscle of right lower leg 09/13/2019  .  Lumbar radiculopathy 09/13/2019  . Peripheral neuropathy 09/13/2019  . Gait abnormality 08/19/2019  . Paresthesia 08/19/2019  . Neck pain 08/19/2019  . Memory loss 08/19/2019  . Chronic diastolic heart failure ([place]) 03/21/2016  . Murmur, cardiac 01/15/2016  . Dyspnea 01/15/2016  . Abnormal ECG 01/15/2016  . Hypertriglyceridemia 01/15/2016    Zachery Conch MOT, OTR/L  03/09/2020, 5:22 PM  [facility] [address] [place], [place], [address] Phone: [phone]   Fax:  [phone]  Name: KRISH BAILLY MRN: [id number] Date of Birth: 02-Feb-1946

## 2020-03-14 ENCOUNTER — Ambulatory Visit: Payer: Medicare PPO | Admitting: Occupational Therapy

## 2020-03-14 ENCOUNTER — Other Ambulatory Visit: Payer: Self-pay

## 2020-03-14 ENCOUNTER — Encounter: Payer: Self-pay | Admitting: Occupational Therapy

## 2020-03-14 DIAGNOSIS — R208 Other disturbances of skin sensation: Secondary | ICD-10-CM

## 2020-03-14 DIAGNOSIS — M6281 Muscle weakness (generalized): Secondary | ICD-10-CM

## 2020-03-14 DIAGNOSIS — R278 Other lack of coordination: Secondary | ICD-10-CM

## 2020-03-14 DIAGNOSIS — M25511 Pain in right shoulder: Secondary | ICD-10-CM | POA: Diagnosis not present

## 2020-03-14 DIAGNOSIS — M542 Cervicalgia: Secondary | ICD-10-CM | POA: Diagnosis not present

## 2020-03-14 DIAGNOSIS — M25642 Stiffness of left hand, not elsewhere classified: Secondary | ICD-10-CM | POA: Diagnosis not present

## 2020-03-14 DIAGNOSIS — M79602 Pain in left arm: Secondary | ICD-10-CM

## 2020-03-14 DIAGNOSIS — G8929 Other chronic pain: Secondary | ICD-10-CM

## 2020-03-14 DIAGNOSIS — M79642 Pain in left hand: Secondary | ICD-10-CM | POA: Diagnosis not present

## 2020-03-14 NOTE — Therapy (Signed)
[facility] [address] [place], [place], [address] Phone: [phone]   Fax:  [phone]  Occupational Therapy Treatment  Patient Details  Name: Marc Thornton MRN: [id number] Date of Birth: 03/17/1946 Referring Provider (OT): Dr Krista Blue   Encounter Date: 03/14/2020   OT End of Session - 03/14/20 1507    Visit Number 8    Number of Visits 17    Date for OT Re-Evaluation 04/25/20    Authorization Type [employer]    Authorization - Visit Number 8    Authorization - Number of Visits 10    Progress Note Due on Visit 10    OT Start Time 1410    OT Stop Time 1450    OT Time Calculation (min) 40 min    Activity Tolerance Patient tolerated treatment well    Behavior During Therapy [facility] for tasks assessed/performed           Past Medical History:  Diagnosis Date  . Arthritis   . BPH (benign prostatic hyperplasia)   . Cardiomyopathy ([place])   . Chicken pox   . 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 DDD (degenerative disc disease), cervical   . Depression   . Elevated PSA   . Fracture of foot bone, right, closed 1964  . GERD (gastroesophageal reflux disease)   . Heart murmur   . Hyperlipidemia   . Polyneuropathy   . Rheumatic fever   . Seasonal allergies     Past Surgical History:  Procedure Laterality Date  . COLONOSCOPY     2006 normal exam  . PILONIDAL CYST EXCISION  1966  . WISDOM TOOTH EXTRACTION      There were no vitals filed for this visit.   Subjective Assessment - 03/14/20 1418    Subjective  Pins and needles after walking outsdie in the cold    Currently in Pain? Yes    Pain Score 1     Pain Location Hand    Pain Orientation Left    Pain Descriptors / Indicators Numbness              [facility] OT Assessment - 03/14/20 1441      Coordination   Left 9 Hole Peg Test 25.59, 22.53 sec with gloved hand                    OT Treatments/Exercises (OP) - 03/14/20 0001       ADLs   Functional Mobility Reviewed positions for sleep to reduce UE numbness.  Highlighted importance of aligning neck with body, and not overlaoding weight onto arm when lying on side    ADL Comments Reviewed remianing goals.  Patient reports he had been improving prior to Thanksgiving, and has noticed slight set back - so goals related to hand on steering wheel and buttoning were intially better, and now have reverted back slightly.  Patient met goal relating to FM coordiantion.  Patient has PT evaluation next week.  Discussed request for neuropsych referral with patient - he may consider consulting with geneticist.        Fine Motor Coordination (Hand/Wrist)   In Hand Manipulation Training Repeated 9 hole peg test with and without gloved hand to promote increased friction with grip pinch.  Followed with manipulation of nuts and bolts on tabletop - with and without glove on left hand.  Patient reports he feels glove offers more friction, so somewhat able to manipualte items, and also  he feels it is challenging to work fine prehension with glove on.                    OT Education - 03/14/20 1507    Education Details goal achievement - 9 hole peg.  Use of glove to increase friction in hand for grip and pinch    Person(s) Educated Patient    Methods Explanation;Demonstration    Comprehension Verbalized understanding;Returned demonstration            OT Short Term Goals - 03/14/20 1511      OT SHORT TERM GOAL #1   Title Patient will complete HEP to address hand range of motion    Time 4    Period Weeks    Status Achieved      OT SHORT TERM GOAL #2   Time 4    Period Weeks    Status Achieved      OT SHORT TERM GOAL #3   Title Patient will demonstrate 5 lb increase in grip strength in left hand to aide in grip on hand tools.    Period Weeks    Status Achieved             OT Long Term Goals - 03/14/20 1436      OT LONG TERM GOAL #1   Title Patient will complete  updated HEP to address proximal strength in BUE    Period Weeks    Status On-going      OT LONG TERM GOAL #2   Title Patient will self report decreased time to button shirts using BUE method    Time 8    Period Weeks    Status On-going      OT LONG TERM GOAL #3   Title Patient will demonstrate 3 second reduction in 9 hole peg test left UE    Baseline 31 sec intially, less than 23 sec today 12/7    Time 8    Period Weeks    Status Achieved      OT LONG TERM GOAL #4   Title Patient will report improved comfort and ability to maintain hands on steering wheel while driving for 10 min or greater.    Time 8    Period Weeks    Status On-going                 Plan - 03/14/20 1508    Clinical Impression Statement Pt's progress towards goals waxes and wains, as do symptoms of numbness.  Cold temps and use seem to exacerbate numbness,   Pt scheduled for PT evaluation 12/14 for neck pain    OT Occupational Profile and History Problem Focused Assessment - Including review of records relating to presenting problem    Occupational performance deficits (Please refer to evaluation for details): IADL's;Rest and Sleep;Leisure    Body Structure / Function / Physical Skills ADL;Coordination;GMC;UE functional use;Sensation;Pain;IADL;Flexibility;Decreased knowledge of use of DME;Dexterity;FMC;Improper spinal/pelvic alignment;Strength;ROM    Rehab Potential Good    Clinical Decision Making Limited treatment options, no task modification necessary    Comorbidities Affecting Occupational Performance: None    Modification or Assistance to Complete Evaluation  No modification of tasks or assist necessary to complete eval    OT Frequency 2x / week    OT Duration 8 weeks    OT Treatment/Interventions Self-care/ADL training;Therapeutic exercise;Aquatic Therapy;Moist Heat;Neuromuscular education;Splinting;Patient/family education;Therapeutic activities;Fluidtherapy;Passive range of motion;Manual  Therapy;DME and/or AE instruction;Cryotherapy;Ultrasound    Plan more coordination activities, develop  proximal strengthening program    Recommended Other Services Neuropsych    Consulted and Agree with Plan of Care Patient           Patient will benefit from skilled therapeutic intervention in order to improve the following deficits and impairments:   Body Structure / Function / Physical Skills: ADL, Coordination, GMC, UE functional use, Sensation, Pain, IADL, Flexibility, Decreased knowledge of use of DME, Dexterity, FMC, Improper spinal/pelvic alignment, Strength, ROM       Visit Diagnosis: Pain in left hand  Pain in left arm  Stiffness of left hand, not elsewhere classified  Other lack of coordination  Chronic right shoulder pain  Muscle weakness (generalized)  Other disturbances of skin sensation    Problem List Patient Active Problem List   Diagnosis Date Noted  . Midline low back pain without sciatica 09/30/2019  . Atrophy of muscle of right lower leg 09/13/2019  . Lumbar radiculopathy 09/13/2019  . Peripheral neuropathy 09/13/2019  . Gait abnormality 08/19/2019  . Paresthesia 08/19/2019  . Neck pain 08/19/2019  . Memory loss 08/19/2019  . Chronic diastolic heart failure ([place]) 03/21/2016  . Murmur, cardiac 01/15/2016  . Dyspnea 01/15/2016  . Abnormal ECG 01/15/2016  . Hypertriglyceridemia 01/15/2016    Mariah Milling, OTR/L 03/14/2020, 3:12 PM  [facility] [address] [place], [place], [address] Phone: [phone]   Fax:  [phone]  Name: Marc Thornton MRN: [id number] Date of Birth: 09-Nov-1945

## 2020-03-16 ENCOUNTER — Encounter: Payer: Self-pay | Admitting: Occupational Therapy

## 2020-03-16 ENCOUNTER — Other Ambulatory Visit: Payer: Self-pay

## 2020-03-16 ENCOUNTER — Ambulatory Visit: Payer: Medicare PPO | Admitting: Occupational Therapy

## 2020-03-16 DIAGNOSIS — M79642 Pain in left hand: Secondary | ICD-10-CM | POA: Diagnosis not present

## 2020-03-16 DIAGNOSIS — M79602 Pain in left arm: Secondary | ICD-10-CM | POA: Diagnosis not present

## 2020-03-16 DIAGNOSIS — M25642 Stiffness of left hand, not elsewhere classified: Secondary | ICD-10-CM | POA: Diagnosis not present

## 2020-03-16 DIAGNOSIS — M542 Cervicalgia: Secondary | ICD-10-CM | POA: Diagnosis not present

## 2020-03-16 DIAGNOSIS — G8929 Other chronic pain: Secondary | ICD-10-CM | POA: Diagnosis not present

## 2020-03-16 DIAGNOSIS — M6281 Muscle weakness (generalized): Secondary | ICD-10-CM

## 2020-03-16 DIAGNOSIS — R208 Other disturbances of skin sensation: Secondary | ICD-10-CM

## 2020-03-16 DIAGNOSIS — R278 Other lack of coordination: Secondary | ICD-10-CM | POA: Diagnosis not present

## 2020-03-16 DIAGNOSIS — M25511 Pain in right shoulder: Secondary | ICD-10-CM | POA: Diagnosis not present

## 2020-03-16 NOTE — Therapy (Signed)
[facility] [address] [place], [place], [address] Phone: [phone]   Fax:  [phone]  Occupational Therapy Treatment  Patient Details  Name: Marc Thornton MRN: [id number] Date of Birth: 17-Oct-1945 Referring Provider (OT): Dr Krista Blue   Encounter Date: 03/16/2020   OT End of Session - 03/16/20 1729    Visit Number 9    Number of Visits [place] [employer]    Authorization - Visit Number 9    Authorization - Number of Visits 10    Progress Note Due on Visit 10    OT Start Time 1620    OT Stop Time 1710    OT Time Calculation (min) 50 min    Activity Tolerance Patient tolerated treatment well    Behavior During Therapy [facility] for tasks assessed/performed           Past Medical History:  Diagnosis Date  . Arthritis   . BPH (benign prostatic hyperplasia)   . Cardiomyopathy ([place])   . Chicken pox   . 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 DDD (degenerative disc disease), cervical   . Depression   . Elevated PSA   . Fracture of foot bone, right, closed 1964  . GERD (gastroesophageal reflux disease)   . Heart murmur   . Hyperlipidemia   . Polyneuropathy   . Rheumatic fever   . Seasonal allergies     Past Surgical History:  Procedure Laterality Date  . COLONOSCOPY     2006 normal exam  . PILONIDAL CYST EXCISION  1966  . WISDOM TOOTH EXTRACTION      There were no vitals filed for this visit.   Subjective Assessment - 03/16/20 1721    Subjective  Symptoms aggravated by cold    Currently in Pain? Yes    Pain Score 3     Pain Location Hand    Pain Orientation Left    Pain Descriptors / Indicators Numbness    Pain Type Chronic pain    Pain Onset More than a month ago    Pain Frequency Constant    Aggravating Factors  cold, excessive use or overstretch    Pain Relieving Factors rest and gentle exercise                        OT Treatments/Exercises (OP) -  03/16/20 0001      ADLs   Leisure Patient enjoys working outside, and has been limited by pain/numbness once hands are cold.  Discussed wearing thin gloves to help insulate hands, and allow movement.  Encouraged donning gloves prior to letting hands get cold.  Patient with raynaud looking whitness over volar and dorsal surfaces of fingers with flex/ext.      Ultrasound   Ultrasound Location volar wrist, forearm, palm, digits, dorsal forearm    Ultrasound Parameters 5cm2, 8 min then 5 min continuous, 24mhz, o.8w/cm2.  ,    Ultrasound Goals Edema;Pain      Manual Therapy   Soft tissue mobilization forearm soft tissue between radius, ulna, volar wrist, digits for composite flexion                    OT Short Term Goals - 03/16/20 1734      OT SHORT TERM GOAL #1   Title Patient will complete HEP to address hand range of motion    Time 4    Period Weeks  Status Achieved      OT SHORT TERM GOAL #2   Time 4    Period Weeks    Status Achieved      OT SHORT TERM GOAL #3   Title Patient will demonstrate 5 lb increase in grip strength in left hand to aide in grip on hand tools.    Period Weeks    Status Achieved             OT Long Term Goals - 03/16/20 1734      OT LONG TERM GOAL #1   Title Patient will complete updated HEP to address proximal strength in BUE    Period Weeks    Status On-going      OT LONG TERM GOAL #2   Title Patient will self report decreased time to button shirts using BUE method    Time 8    Period Weeks    Status On-going      OT LONG TERM GOAL #3   Title Patient will demonstrate 3 second reduction in 9 hole peg test left UE    Baseline 31 sec intially, less than 23 sec today 12/7    Time 8    Period Weeks    Status Achieved      OT LONG TERM GOAL #4   Title Patient will report improved comfort and ability to maintain hands on steering wheel while driving for 10 min or greater.    Time 8    Period Weeks    Status On-going                  Plan - 03/16/20 1730    Clinical Impression Statement Patient working his way back to prior level of functioning (before Thanksgiving) Patient has PT eval next week to address cervical issues    OT Occupational Profile and History Problem Focused Assessment - Including review of records relating to presenting problem    Occupational performance deficits (Please refer to evaluation for details): IADL's;Rest and Sleep;Leisure    Body Structure / Function / Physical Skills ADL;Coordination;GMC;UE functional use;Sensation;Pain;IADL;Flexibility;Decreased knowledge of use of DME;Dexterity;FMC;Improper spinal/pelvic alignment;Strength;ROM    Rehab Potential Good    Clinical Decision Making Limited treatment options, no task modification necessary    Comorbidities Affecting Occupational Performance: None    Modification or Assistance to Complete Evaluation  No modification of tasks or assist necessary to complete eval    OT Frequency 2x / week    OT Duration 8 weeks    OT Treatment/Interventions Self-care/ADL training;Therapeutic exercise;Aquatic Therapy;Moist Heat;Neuromuscular education;Splinting;Patient/family education;Therapeutic activities;Fluidtherapy;Passive range of motion;Manual Therapy;DME and/or AE instruction;Cryotherapy;Ultrasound    Plan soft tissue mobilization, ultrasound left hand - volar wrist, forearm, palms, dorsal forearm    Recommended Other Services Neuropsych    Consulted and Agree with Plan of Care Patient           Patient will benefit from skilled therapeutic intervention in order to improve the following deficits and impairments:   Body Structure / Function / Physical Skills: ADL,Coordination,GMC,UE functional use,Sensation,Pain,IADL,Flexibility,Decreased knowledge of use of DME,Dexterity,FMC,Improper spinal/pelvic alignment,Strength,ROM       Visit Diagnosis: Pain in left hand  Pain in left arm  Stiffness of left hand, not elsewhere  classified  Other lack of coordination  Chronic right shoulder pain  Muscle weakness (generalized)  Other disturbances of skin sensation    Problem List Patient Active Problem List   Diagnosis Date Noted  . Midline low back pain without sciatica 09/30/2019  .  Atrophy of muscle of right lower leg 09/13/2019  . Lumbar radiculopathy 09/13/2019  . Peripheral neuropathy 09/13/2019  . Gait abnormality 08/19/2019  . Paresthesia 08/19/2019  . Neck pain 08/19/2019  . Memory loss 08/19/2019  . Chronic diastolic heart failure ([place]) 03/21/2016  . Murmur, cardiac 01/15/2016  . Dyspnea 01/15/2016  . Abnormal ECG 01/15/2016  . Hypertriglyceridemia 01/15/2016    Mariah Milling, OTR/L 03/16/2020, 5:35 PM  [facility] [address] [place], [place], [address] Phone: [phone]   Fax:  [phone]  Name: Marc Thornton MRN: [id number] Date of Birth: [birth year]

## 2020-03-21 ENCOUNTER — Other Ambulatory Visit: Payer: Self-pay

## 2020-03-21 ENCOUNTER — Ambulatory Visit: Payer: Medicare PPO | Admitting: Occupational Therapy

## 2020-03-21 ENCOUNTER — Encounter: Payer: Self-pay | Admitting: Occupational Therapy

## 2020-03-21 ENCOUNTER — Ambulatory Visit: Payer: Medicare PPO | Admitting: Physical Therapy

## 2020-03-21 ENCOUNTER — Encounter: Payer: Self-pay | Admitting: Physical Therapy

## 2020-03-21 DIAGNOSIS — M25511 Pain in right shoulder: Secondary | ICD-10-CM | POA: Diagnosis not present

## 2020-03-21 DIAGNOSIS — M79642 Pain in left hand: Secondary | ICD-10-CM | POA: Diagnosis not present

## 2020-03-21 DIAGNOSIS — M542 Cervicalgia: Secondary | ICD-10-CM

## 2020-03-21 DIAGNOSIS — R278 Other lack of coordination: Secondary | ICD-10-CM | POA: Diagnosis not present

## 2020-03-21 DIAGNOSIS — M6281 Muscle weakness (generalized): Secondary | ICD-10-CM

## 2020-03-21 DIAGNOSIS — M79602 Pain in left arm: Secondary | ICD-10-CM

## 2020-03-21 DIAGNOSIS — R208 Other disturbances of skin sensation: Secondary | ICD-10-CM

## 2020-03-21 DIAGNOSIS — G8929 Other chronic pain: Secondary | ICD-10-CM | POA: Diagnosis not present

## 2020-03-21 DIAGNOSIS — M5413 Radiculopathy, cervicothoracic region: Secondary | ICD-10-CM

## 2020-03-21 DIAGNOSIS — R293 Abnormal posture: Secondary | ICD-10-CM

## 2020-03-21 DIAGNOSIS — M25642 Stiffness of left hand, not elsewhere classified: Secondary | ICD-10-CM
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
## 2020-03-21 NOTE — Therapy (Signed)
[facility] [address] [place], [place], [address] Phone: [phone]   Fax:  [phone]  Occupational Therapy Treatment and Progress Note   Patient Details  Name: Marc Thornton MRN: [id number] Date of Birth: 24-May-1945 Referring Provider (OT): Dr Krista Blue  This progress note covers dates of service from 02/10/20- 03/21/20  Encounter Date: 03/21/2020   OT End of Session - 03/21/20 1519    Visit Number 10    Number of Visits 17    Date for OT Re-Evaluation 04/25/20    Authorization Type [employer]    Authorization - Visit Number 10    Authorization - Number of Visits 10    Progress Note Due on Visit 10    OT Start Time 1400    OT Stop Time 1445    OT Time Calculation (min) 45 min    Activity Tolerance Patient tolerated treatment well    Behavior During Therapy [facility] for tasks assessed/performed           Past Medical History:  Diagnosis Date  . Arthritis   . BPH (benign prostatic hyperplasia)   . Cardiomyopathy ([place])   . Chicken pox   . 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 DDD (degenerative disc disease), cervical   . Depression   . Elevated PSA   . Fracture of foot bone, right, closed 1964  . GERD (gastroesophageal reflux disease)   . Heart murmur   . Hyperlipidemia   . Polyneuropathy   . Rheumatic fever   . Seasonal allergies     Past Surgical History:  Procedure Laterality Date  . COLONOSCOPY     2006 normal exam  . PILONIDAL CYST EXCISION  1966  . WISDOM TOOTH EXTRACTION      There were no vitals filed for this visit.   Subjective Assessment - 03/21/20 1414    Subjective  I had a lot of soreness in my arm on Monday after digging a trench on Saturday    Currently in Pain? Yes    Pain Score 3     Pain Location Hand    Pain Orientation Left    Pain Descriptors / Indicators Numbness;Aching    Pain Type Chronic pain    Pain Onset More than a month ago    Pain Frequency Constant     Aggravating Factors  cold, excessive use or stretch    Pain Relieving Factors rest and gentle stretch, heat              [facility] OT Assessment - 03/21/20 0001      Hand Function   Right Hand Grip (lbs) 98    Left Hand Grip (lbs) 82                    OT Treatments/Exercises (OP) - 03/21/20 0001      ADLs   Cooking Patient made marmalade from fruit tree.  Patient working to increase hand conditioning.    Work Patient reports hand fatiguing while digging a trench on Saturday, and then had significant left arm pain Monday night.    ADL Comments Patient bought gloves and brought them in to show padded palm.      Exercises   Exercises Theraputty      Theraputty   Theraputty - Grip red putty - medium soft.  Discussed using for 5-10 min at a time to begin to increase hand strength      Ultrasound  Ultrasound Location volar wrist, palm, digits, dorsal forearm (mid to distal shaft)    Ultrasound Parameters 5cm2, 58mhz, continuous, 8 min,    Ultrasound Goals Edema;Pain                  OT Education - 03/21/20 1517    Education Details putty - grasp.  5-10 min at at ime throughout day.  Discussed benefit of having neuropsych testing to establish baseline.    Person(s) Educated Patient    Methods Explanation;Demonstration    Comprehension Verbalized understanding;Returned demonstration            OT Short Term Goals - 03/21/20 1522      OT SHORT TERM GOAL #1   Title Patient will complete HEP to address hand range of motion    Time 4    Period Weeks    Status Achieved      OT SHORT TERM GOAL #2   Time 4    Period Weeks    Status Achieved      OT SHORT TERM GOAL #3   Title Patient will demonstrate 5 lb increase in grip strength in left hand to aide in grip on hand tools.    Period Weeks    Status Achieved             OT Long Term Goals - 03/21/20 1522      OT LONG TERM GOAL #1   Title Patient will complete updated HEP to address proximal  strength in BUE    Period Weeks    Status On-going      OT LONG TERM GOAL #2   Title Patient will self report decreased time to button shirts using BUE method    Time 8    Period Weeks    Status On-going      OT LONG TERM GOAL #3   Title Patient will demonstrate 3 second reduction in 9 hole peg test left UE    Baseline 31 sec intially, less than 23 sec today 12/7    Time 8    Period Weeks    Status Achieved      OT LONG TERM GOAL #4   Title Patient will report improved comfort and ability to maintain hands on steering wheel while driving for 10 min or greater.    Time 8    Period Weeks    Status On-going                 Plan - 03/21/20 1520    Clinical Impression Statement Patient showing decreased level of consistent pain in 5/10, and now only spikes of pain at that level.    OT Occupational Profile and History Problem Focused Assessment - Including review of records relating to presenting problem    Occupational performance deficits (Please refer to evaluation for details): IADL's;Rest and Sleep;Leisure    Body Structure / Function / Physical Skills ADL;Coordination;GMC;UE functional use;Sensation;Pain;IADL;Flexibility;Decreased knowledge of use of DME;Dexterity;FMC;Improper spinal/pelvic alignment;Strength;ROM    Rehab Potential Good    Clinical Decision Making Limited treatment options, no task modification necessary    Comorbidities Affecting Occupational Performance: None    Modification or Assistance to Complete Evaluation  No modification of tasks or assist necessary to complete eval    OT Frequency 2x / week    OT Duration 8 weeks    OT Treatment/Interventions Self-care/ADL training;Therapeutic exercise;Aquatic Therapy;Moist Heat;Neuromuscular education;Splinting;Patient/family education;Therapeutic activities;Fluidtherapy;Passive range of motion;Manual Therapy;DME and/or AE instruction;Cryotherapy;Ultrasound    Plan soft tissue mobilization,  ultrasound left hand  - volar wrist, forearm, palms, dorsal forearm    Recommended Other Services Neuropsych    Consulted and Agree with Plan of Care Patient           Patient will benefit from skilled therapeutic intervention in order to improve the following deficits and impairments:   Body Structure / Function / Physical Skills: ADL,Coordination,GMC,UE functional use,Sensation,Pain,IADL,Flexibility,Decreased knowledge of use of DME,Dexterity,FMC,Improper spinal/pelvic alignment,Strength,ROM       Visit Diagnosis: Pain in left hand  Pain in left arm  Stiffness of left hand, not elsewhere classified  Other lack of coordination  Chronic right shoulder pain  Muscle weakness (generalized)  Other disturbances of skin sensation    Problem List Patient Active Problem List   Diagnosis Date Noted  . Midline low back pain without sciatica 09/30/2019  . Atrophy of muscle of right lower leg 09/13/2019  . Lumbar radiculopathy 09/13/2019  . Peripheral neuropathy 09/13/2019  . Gait abnormality 08/19/2019  . Paresthesia 08/19/2019  . Neck pain 08/19/2019  . Memory loss 08/19/2019  . Chronic diastolic heart failure ([place]) 03/21/2016  . Murmur, cardiac 01/15/2016  . Dyspnea 01/15/2016  . Abnormal ECG 01/15/2016  . Hypertriglyceridemia 01/15/2016    Mariah Milling, OTR/L 03/21/2020, 3:24 PM  [facility] [address] [place], [place], [address] Phone: [phone]   Fax:  [phone]  Name: AIYDEN LAUDERBACK MRN: [id number] Date of Birth: [birth year]

## 2020-03-22 ENCOUNTER — Encounter: Payer: Self-pay | Admitting: Occupational Therapy

## 2020-03-23 ENCOUNTER — Ambulatory Visit: Payer: Medicare PPO | Admitting: Occupational Therapy

## 2020-03-23 ENCOUNTER — Other Ambulatory Visit: Payer: Self-pay

## 2020-03-23 ENCOUNTER — Encounter: Payer: Self-pay | Admitting: Occupational Therapy

## 2020-03-23 DIAGNOSIS — M79642 Pain in left hand: Secondary | ICD-10-CM

## 2020-03-23 DIAGNOSIS — G8929 Other chronic pain: Secondary | ICD-10-CM | POA: Diagnosis not present

## 2020-03-23 DIAGNOSIS — M6281 Muscle weakness (generalized): Secondary | ICD-10-CM

## 2020-03-23 DIAGNOSIS — M25642 Stiffness of left hand, not elsewhere classified: Secondary | ICD-10-CM

## 2020-03-23 DIAGNOSIS — R278 Other lack of coordination: Secondary | ICD-10-CM | POA: Diagnosis not present

## 2020-03-23 DIAGNOSIS — R208 Other disturbances of skin sensation: Secondary | ICD-10-CM | POA: Diagnosis not present

## 2020-03-23 DIAGNOSIS — M79602 Pain in left arm: Secondary | ICD-10-CM

## 2020-03-23 DIAGNOSIS — M25511 Pain in right shoulder: Secondary | ICD-10-CM | POA: Diagnosis not present

## 2020-03-23 DIAGNOSIS — M542 Cervicalgia: Secondary | ICD-10-CM | POA: Diagnosis not present

## 2020-03-23 NOTE — Therapy (Signed)
[facility] [address] [place], [place], [address] Phone: [phone]   Fax:  [phone]  Occupational Therapy Treatment  Patient Details  Name: Marc Thornton MRN: [id number] Date of Birth: 09-10-1945 Referring Provider (OT): Dr Krista Blue   Encounter Date: 03/23/2020   OT End of Session - 03/23/20 1505    Visit Number 12    Number of Visits 17    Date for OT Re-Evaluation 04/25/20    Authorization Type [employer]    Authorization - Visit Number 11    Progress Note Due on Visit 20    OT Start Time 1400    OT Stop Time 1445    OT Time Calculation (min) 45 min    Activity Tolerance Patient tolerated treatment well    Behavior During Therapy [facility] for tasks assessed/performed           Past Medical History:  Diagnosis Date  . Arthritis   . BPH (benign prostatic hyperplasia)   . Cardiomyopathy ([place])   . Chicken pox   . 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 DDD (degenerative disc disease), cervical   . Depression   . Elevated PSA   . Fracture of foot bone, right, closed 1964  . GERD (gastroesophageal reflux disease)   . Heart murmur   . Hyperlipidemia   . Polyneuropathy   . Rheumatic fever   . Seasonal allergies     Past Surgical History:  Procedure Laterality Date  . COLONOSCOPY     2006 normal exam  . PILONIDAL CYST EXCISION  1966  . WISDOM TOOTH EXTRACTION      There were no vitals filed for this visit.   Subjective Assessment - 03/23/20 1405    Subjective  I did a little digging and that really bothers it    Currently in Pain? Yes    Pain Score 3     Pain Location Hand    Pain Orientation Left    Pain Descriptors / Indicators Aching;Numbness    Pain Type Chronic pain    Pain Onset More than a month ago    Pain Frequency Constant    Aggravating Factors  cold, excessive use    Pain Relieving Factors rest and gentle stretch                        OT  Treatments/Exercises (OP) - 03/23/20 0001      Wrist Exercises   Other wrist exercises Wrist flexion, extension with 2,3,4lb weight to determine tolerance.  Patient able to tolerate 15 reps of 4 lb.    Other wrist exercises supination/pronation with 4lb weights      Neurological Re-education Exercises   Other Exercises 1 Neural stretch wrist, digit, elbow extension, and wrist flexion, digit extension using power web,  Worked on power grip      Ultrasound   Ultrasound Location volar wrist, palm, digits, dorsal forearm    Ultrasound Parameters 5cm2, 11mhz, continuous, 10 min    Ultrasound Goals Pain;Edema                  OT Education - 03/23/20 1504    Education Details wrist flexion and extension, forearm pronation and supination with 4 lb resistance    Person(s) Educated Patient    Methods Explanation;Demonstration;Verbal cues    Comprehension Verbalized understanding;Returned demonstration            OT Short Term Goals -  03/23/20 1507      OT SHORT TERM GOAL #1   Title Patient will complete HEP to address hand range of motion    Time 4    Period Weeks    Status Achieved      OT SHORT TERM GOAL #2   Time 4    Period Weeks    Status Achieved      OT SHORT TERM GOAL #3   Title Patient will demonstrate 5 lb increase in grip strength in left hand to aide in grip on hand tools.    Period Weeks    Status Achieved             OT Long Term Goals - 03/23/20 1507      OT LONG TERM GOAL #1   Title Patient will complete updated HEP to address proximal strength in BUE    Period Weeks    Status On-going      OT LONG TERM GOAL #2   Title Patient will self report decreased time to button shirts using BUE method    Time 8    Period Weeks    Status On-going      OT LONG TERM GOAL #3   Title Patient will demonstrate 3 second reduction in 9 hole peg test left UE    Baseline 31 sec intially, less than 23 sec today 12/7    Time 8    Period Weeks    Status  Achieved      OT LONG TERM GOAL #4   Title Patient will report improved comfort and ability to maintain hands on steering wheel while driving for 10 min or greater.    Time 8    Period Weeks    Status On-going                 Plan - 03/23/20 1506    Clinical Impression Statement Patient showing steady improvement in functional hand strength, has started light resistance program    OT Occupational Profile and History Problem Focused Assessment - Including review of records relating to presenting problem    Occupational performance deficits (Please refer to evaluation for details): IADL's;Rest and Sleep;Leisure    Body Structure / Function / Physical Skills ADL;Coordination;GMC;UE functional use;Sensation;Pain;IADL;Flexibility;Decreased knowledge of use of DME;Dexterity;FMC;Improper spinal/pelvic alignment;Strength;ROM    Rehab Potential Good    Clinical Decision Making Limited treatment options, no task modification necessary    Comorbidities Affecting Occupational Performance: None    Modification or Assistance to Complete Evaluation  No modification of tasks or assist necessary to complete eval    OT Frequency 2x / week    OT Duration 8 weeks    OT Treatment/Interventions Self-care/ADL training;Therapeutic exercise;Aquatic Therapy;Moist Heat;Neuromuscular education;Splinting;Patient/family education;Therapeutic activities;Fluidtherapy;Passive range of motion;Manual Therapy;DME and/or AE instruction;Cryotherapy;Ultrasound    Plan soft tissue mobilization, ultrasound left hand - volar wrist, forearm, palms, dorsal forearm    Recommended Other Services Neuropsych    Consulted and Agree with Plan of Care Patient           Patient will benefit from skilled therapeutic intervention in order to improve the following deficits and impairments:   Body Structure / Function / Physical Skills: ADL,Coordination,GMC,UE functional use,Sensation,Pain,IADL,Flexibility,Decreased knowledge of use  of DME,Dexterity,FMC,Improper spinal/pelvic alignment,Strength,ROM       Visit Diagnosis: Muscle weakness (generalized)  Pain in left hand  Pain in left arm  Stiffness of left hand, not elsewhere classified  Other lack of coordination  Chronic right shoulder pain  Other  disturbances of skin sensation    Problem List Patient Active Problem List   Diagnosis Date Noted  . Midline low back pain without sciatica 09/30/2019  . Atrophy of muscle of right lower leg 09/13/2019  . Lumbar radiculopathy 09/13/2019  . Peripheral neuropathy 09/13/2019  . Gait abnormality 08/19/2019  . Paresthesia 08/19/2019  . Neck pain 08/19/2019  . Memory loss 08/19/2019  . Chronic diastolic heart failure ([place]) 03/21/2016  . Murmur, cardiac 01/15/2016  . Dyspnea 01/15/2016  . Abnormal ECG 01/15/2016  . Hypertriglyceridemia 01/15/2016    Mariah Milling, OTR/L 03/23/2020, 3:08 PM  [facility] [address] [place], [place], [address] Phone: [phone]   Fax:  [phone]  Name: Marc Thornton MRN: [id number] Date of Birth: 1945/11/05

## 2020-03-28 ENCOUNTER — Ambulatory Visit: Payer: Medicare PPO | Admitting: Physical Therapy

## 2020-03-28 ENCOUNTER — Other Ambulatory Visit: Payer: Self-pay

## 2020-03-28 ENCOUNTER — Ambulatory Visit: Payer: Medicare PPO | Admitting: Occupational Therapy

## 2020-03-28 ENCOUNTER — Encounter: Payer: Self-pay | Admitting: Occupational Therapy

## 2020-03-28 DIAGNOSIS — M25642 Stiffness of left hand, not elsewhere classified: Secondary | ICD-10-CM | POA: Diagnosis not present

## 2020-03-28 DIAGNOSIS — M79642 Pain in left hand: Secondary | ICD-10-CM

## 2020-03-28 DIAGNOSIS — M542 Cervicalgia: Secondary | ICD-10-CM | POA: Diagnosis not present

## 2020-03-28 DIAGNOSIS — M79602 Pain in left arm: Secondary | ICD-10-CM | POA: Diagnosis not present

## 2020-03-28 DIAGNOSIS — R278 Other lack of coordination: Secondary | ICD-10-CM | POA: Diagnosis not present

## 2020-03-28 DIAGNOSIS — R208 Other disturbances of skin sensation: Secondary | ICD-10-CM | POA: Diagnosis not present

## 2020-03-28 DIAGNOSIS — M25511 Pain in right shoulder: Secondary | ICD-10-CM | POA: Diagnosis not present

## 2020-03-28 DIAGNOSIS — R293 Abnormal posture: Secondary | ICD-10-CM

## 2020-03-28 DIAGNOSIS — M5413 Radiculopathy, cervicothoracic region: Secondary | ICD-10-CM

## 2020-03-28 DIAGNOSIS — M6281 Muscle weakness (generalized): Secondary | ICD-10-CM

## 2020-03-28 DIAGNOSIS — G8929 Other chronic pain: Secondary | ICD-10-CM | POA: Diagnosis not present

## 2020-03-28 NOTE — Therapy (Signed)
[facility] [address] [place], [place], [address] Phone: [phone]   Fax:  [phone]  Occupational Therapy Treatment  Patient Details  Name: Marc Thornton MRN: [id number] Date of Birth: Aug 09, 1945 Referring Provider (OT): Dr Krista Blue   Encounter Date: 03/28/2020   OT End of Session - 03/28/20 1458    Visit Number 13    Number of Visits 17    Date for OT Re-Evaluation 04/25/20    Authorization Type [employer]    Authorization - Visit Number 12    Authorization - Number of Visits 10    Progress Note Due on Visit 20    OT Start Time 1400    OT Stop Time 1445    OT Time Calculation (min) 45 min    Activity Tolerance Patient tolerated treatment well    Behavior During Therapy [facility] for tasks assessed/performed           Past Medical History:  Diagnosis Date  . Arthritis   . BPH (benign prostatic hyperplasia)   . Cardiomyopathy ([place])   . Chicken pox   . 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 DDD (degenerative disc disease), cervical   . Depression   . Elevated PSA   . Fracture of foot bone, right, closed 1964  . GERD (gastroesophageal reflux disease)   . Heart murmur   . Hyperlipidemia   . Polyneuropathy   . Rheumatic fever   . Seasonal allergies     Past Surgical History:  Procedure Laterality Date  . COLONOSCOPY     2006 normal exam  . PILONIDAL CYST EXCISION  1966  . WISDOM TOOTH EXTRACTION      There were no vitals filed for this visit.                 OT Treatments/Exercises (OP) - 03/28/20 0001      ADLs   UB Dressing Patient is consistently using two handed technique for buttoning shirts, collars.  Needs increased time due to numbness in LUE, and has difficulty with one handed buttoning eg at cuffs.      Neurological Re-education Exercises   Theraputty - Grip upgraded to green - med firm      Ultrasound   Ultrasound Location volar wrist, palm, fingertips, dorsal  wrist, forearm    Ultrasound Parameters 5cm2, 37mhz, continuous, 10 min    Ultrasound Goals Pain;Edema      Manual Therapy   Soft tissue mobilization volar - base of hand and wrist, forearm between radius and ulna.  Gentle metacarpal mobilization                  OT Education - 03/28/20 1458    Education Details upgraded to green putty    Person(s) Educated Patient    Methods Explanation    Comprehension Verbalized understanding            OT Short Term Goals - 03/28/20 1501      OT SHORT TERM GOAL #1   Title Patient will complete HEP to address hand range of motion    Time 4    Period Weeks    Status Achieved      OT SHORT TERM GOAL #2   Time 4    Period Weeks    Status Achieved      OT SHORT TERM GOAL #3   Title Patient will demonstrate 5 lb increase in grip strength in left hand to aide  in grip on hand tools.    Period Weeks    Status Achieved             OT Long Term Goals - 03/28/20 1409      OT LONG TERM GOAL #1   Title Patient will complete updated HEP to address proximal strength in BUE    Time 8    Period Weeks    Status On-going      OT LONG TERM GOAL #2   Title Patient will self report decreased time to button shirts using BUE method    Time 8    Period Weeks    Status Achieved      OT LONG TERM GOAL #3   Title Patient will demonstrate 3 second reduction in 9 hole peg test left UE    Baseline 31 sec intially, less than 23 sec today 12/7    Time 8    Period Weeks    Status Achieved                 Plan - 03/28/20 1500    Clinical Impression Statement Patient reports balancing heavy work with rest periods, partly due to weather, and reports less pain overall    OT Occupational Profile and History Problem Focused Assessment - Including review of records relating to presenting problem    Occupational performance deficits (Please refer to evaluation for details): IADL's;Rest and Sleep;Leisure    Body Structure / Function /  Physical Skills ADL;Coordination;GMC;UE functional use;Sensation;Pain;IADL;Flexibility;Decreased knowledge of use of DME;Dexterity;FMC;Improper spinal/pelvic alignment;Strength;ROM    Rehab Potential Good    Clinical Decision Making Limited treatment options, no task modification necessary    Comorbidities Affecting Occupational Performance: None    Modification or Assistance to Complete Evaluation  No modification of tasks or assist necessary to complete eval    OT Frequency 2x / week    OT Duration 8 weeks    OT Treatment/Interventions Self-care/ADL training;Therapeutic exercise;Aquatic Therapy;Moist Heat;Neuromuscular education;Splinting;Patient/family education;Therapeutic activities;Fluidtherapy;Passive range of motion;Manual Therapy;DME and/or AE instruction;Cryotherapy;Ultrasound    Plan soft tissue mobilization, ultrasound left hand - volar wrist, forearm, palms, dorsal forearm    Recommended Other Services Neuropsych    Consulted and Agree with Plan of Care Patient           Patient will benefit from skilled therapeutic intervention in order to improve the following deficits and impairments:   Body Structure / Function / Physical Skills: ADL,Coordination,GMC,UE functional use,Sensation,Pain,IADL,Flexibility,Decreased knowledge of use of DME,Dexterity,FMC,Improper spinal/pelvic alignment,Strength,ROM       Visit Diagnosis: Muscle weakness (generalized)  Pain in left hand  Pain in left arm  Stiffness of left hand, not elsewhere classified  Other lack of coordination  Chronic right shoulder pain  Other disturbances of skin sensation    Problem List Patient Active Problem List   Diagnosis Date Noted  . Midline low back pain without sciatica 09/30/2019  . Atrophy of muscle of right lower leg 09/13/2019  . Lumbar radiculopathy 09/13/2019  . Peripheral neuropathy 09/13/2019  . Gait abnormality 08/19/2019  . Paresthesia 08/19/2019  . Neck pain 08/19/2019  . Memory  loss 08/19/2019  . Chronic diastolic heart failure ([place]) 03/21/2016  . Murmur, cardiac 01/15/2016  . Dyspnea 01/15/2016  . Abnormal ECG 01/15/2016  . Hypertriglyceridemia 01/15/2016    Mariah Milling, OTR/L 03/28/2020, 3:02 PM  [facility] [address] [place], [place], [address] Phone: [phone]   Fax:  [phone]  Name: HUBERT RAATZ MRN:  [id number] Date of Birth: 11/11/1945

## 2020-03-28 NOTE — Therapy (Signed)
[facility] [address] [place], [place], [address] Phone: [phone]   Fax:  [phone]  Physical Therapy Treatment  Patient Details  Name: Marc Thornton MRN: [id number] Date of Birth: 25-Jan-1946 Referring Provider (PT): Marcial Pacas, MD   Encounter Date: 03/28/2020   PT End of Session - 03/28/20 1554    Visit Number 2    Number of Visits 7    Date for PT Re-Evaluation 05/02/20    Authorization Type [employer]    PT Start Time 1450    PT Stop Time 1530    PT Time Calculation (min) 40 min    Activity Tolerance Patient tolerated treatment well    Behavior During Therapy [facility] for tasks assessed/performed           Past Medical History:  Diagnosis Date  . Arthritis   . BPH (benign prostatic hyperplasia)   . Cardiomyopathy ([place])   . Chicken pox   . 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 DDD (degenerative disc disease), cervical   . Depression   . Elevated PSA   . Fracture of foot bone, right, closed 1964  . GERD (gastroesophageal reflux disease)   . Heart murmur   . Hyperlipidemia   . Polyneuropathy   . Rheumatic fever   . Seasonal allergies     Past Surgical History:  Procedure Laterality Date  . COLONOSCOPY     2006 normal exam  . PILONIDAL CYST EXCISION  1966  . WISDOM TOOTH EXTRACTION      There were no vitals filed for this visit.   Subjective Assessment - 03/28/20 1451    Subjective Pt states he ordered a theracane. Pt reports the tennis balls worked some. Pt states his finger tips remain a little numb.    Diagnostic tests MRI: 1.   At C3-C4, degenerative changes combine to cause mild spinal stenosis and moderately severe bilateral foraminal narrowing with potential to compress either of the C4 nerve roots.  2.   At C4-C5, degenerative changes combine to cause mild spinal stenosis and moderately severe right and moderate left foraminal narrowing with potential for right C5 nerve root  compression.  3.   At C5-C6, there is mild spinal stenosis and moderate foraminal narrowing but no nerve root compression.  4.   Partial fusion of the C6 and C7 vertebral bodies.  This could be degenerative in nature.  5.   The spinal cord appears normal.    Patient Stated Goals Improve pain, strength + ROM    Currently in Pain? Yes    Pain Score 2     Pain Location Neck    Pain Orientation Left                             [facility] Adult PT Treatment/Exercise - 03/28/20 1511      Neck Exercises: Supine   Neck Retraction 10 reps    Lateral Flexion Both;10 reps   AAROM   Other Supine Exercise Scapular retraction x10      Neck Exercises: Sidelying   Other Sidelying Exercise Open/close book x10 bilat      Manual Therapy   Manual Therapy Soft tissue mobilization;Joint mobilization    Manual therapy comments suboccipital release 2x30 sec, cervical traction 3x10 sec    Joint Mobilization Grade III to IV cervical mobilization for flexion, rotation, and extension through all spinal segments   rotation and  side flexion most limited C1-C2     Neck Exercises: Stretches   Upper Trapezius Stretch Right;Left;20 seconds    Other Neck Stretches Towel stretch for neck extension + side flexion x20 sec    Other Neck Stretches Attempted towel stretch for suboccipital                    PT Short Term Goals - 03/21/20 1552      PT SHORT TERM GOAL #1   Title Pt will be independent with his initial HEP to decrease pain and improved flexibility.    Time 3    Period Weeks    Status New    Target Date 04/11/20      PT SHORT TERM GOAL #2   Title Pt will demo improved cervical ROM by at least 10 deg for side flexion and rotation    Time 3    Period Weeks    Status New    Target Date 04/11/20             PT Long Term Goals - 03/21/20 1556      PT LONG TERM GOAL #1   Title Pt will be indepedent with HEP and demonstrate good understanding of self progressions of  exericse for completion at discharge.    Time 6    Period Weeks    Status New    Target Date 05/02/20      PT LONG TERM GOAL #2   Title Pt will demo improved cervical ROM by at least 20 deg for side flexion and rotation    Time 6    Period Weeks    Status New    Target Date 05/02/20      PT LONG TERM GOAL #3   Title Pt will report 50% decrease in his neck pain from baseline    Time 6    Period Weeks    Status New    Target Date 05/02/20                 Plan - 03/28/20 1541    Clinical Impression Statement Treatment focused on improving cervical and thoracic ROM via manual therapy, stretching, and strengthening. Initiated postural exercises in supine. Pt with difficulty performing lateral flexion without compensating using cervical rotation. Pt tolerated exercises well.    Personal Factors and Comorbidities Age;Time since onset of injury/illness/exacerbation;Fitness    Examination-Activity Limitations Sleep;Reach Overhead;Lift;Bend    Examination-Participation Restrictions Community Activity;Yard Work    Stability/Clinical Decision Making Stable/Uncomplicated    Rehab Potential Good    PT Frequency 1x / week    PT Duration 6 weeks    PT Treatment/Interventions Cryotherapy;ADLs/Self Care Home Management;Electrical Stimulation;Iontophoresis 4mg /ml Dexamethasone;Moist Heat;Traction;Ultrasound;Functional mobility training;Therapeutic activities;Therapeutic exercise;Neuromuscular re-education;Patient/family education;Manual techniques;Passive range of motion;Dry needling;Taping;Spinal Manipulations;Joint Manipulations;Vestibular    PT Next Visit Plan Manual therapy and stretching as indicated to improve cervical/thoracic ROM; start postural strengthening.    PT Home Exercise Plan Access Code: [email or web address]    Consulted and Agree with Plan of Care Patient           Patient will benefit from skilled therapeutic intervention in order to improve the following deficits and impairments:   Decreased range of motion,Impaired UE functional use,Decreased activity tolerance,Pain,Hypomobility,Impaired flexibility,Improper body mechanics,Decreased mobility,Decreased strength,Postural dysfunction  Visit Diagnosis: Muscle weakness (generalized)  Cervicalgia  Radiculopathy, cervicothoracic region  Abnormal posture     Problem List Patient Active Problem List   Diagnosis Date Noted  . Midline low back pain without  sciatica 09/30/2019  . Atrophy of muscle of right lower leg 09/13/2019  . Lumbar radiculopathy 09/13/2019  . Peripheral neuropathy 09/13/2019  . Gait abnormality 08/19/2019  . Paresthesia 08/19/2019  . Neck pain 08/19/2019  . Memory loss 08/19/2019  . Chronic diastolic heart failure ([place]) 03/21/2016  . Murmur, cardiac 01/15/2016  . Dyspnea 01/15/2016  . Abnormal ECG 01/15/2016  . Hypertriglyceridemia 01/15/2016    Lizzett Nobile April Ma L Djuna Frechette PT, DPT 03/28/2020, 3:59 PM  [facility] [address] [place], [place], [address] Phone: [phone]   Fax:  [phone]  Name: Marc Thornton MRN: [id number] Date of Birth: Nov 20, 1945

## 2020-03-30 ENCOUNTER — Ambulatory Visit: Payer: Medicare PPO | Admitting: Occupational Therapy

## 2020-03-30 ENCOUNTER — Other Ambulatory Visit: Payer: Self-pay

## 2020-03-30 ENCOUNTER — Encounter: Payer: Self-pay | Admitting: Occupational Therapy

## 2020-03-30 DIAGNOSIS — G8929 Other chronic pain: Secondary | ICD-10-CM

## 2020-03-30 DIAGNOSIS — R208 Other disturbances of skin sensation: Secondary | ICD-10-CM | POA: Diagnosis not present

## 2020-03-30 DIAGNOSIS — R278 Other lack of coordination: Secondary | ICD-10-CM

## 2020-03-30 DIAGNOSIS — M25642 Stiffness of left hand, not elsewhere classified: Secondary | ICD-10-CM

## 2020-03-30 DIAGNOSIS — M79602 Pain in left arm: Secondary | ICD-10-CM | POA: Diagnosis not present

## 2020-03-30 DIAGNOSIS — M79642 Pain in left hand: Secondary | ICD-10-CM

## 2020-03-30 DIAGNOSIS — M542 Cervicalgia: Secondary | ICD-10-CM | POA: Diagnosis not present

## 2020-03-30 DIAGNOSIS — M6281 Muscle weakness (generalized): Secondary | ICD-10-CM | POA: Diagnosis not present

## 2020-03-30 DIAGNOSIS — M25511 Pain in right shoulder: Secondary | ICD-10-CM | POA: Diagnosis not present

## 2020-03-30 NOTE — Therapy (Signed)
[facility] [address] [place], [place], [address] Phone: [phone]   Fax:  [phone]  Occupational Therapy Treatment  Patient Details  Name: Marc Thornton MRN: [id number] Date of Birth: 1945-07-16 Referring Provider (OT): Dr Krista Blue   Encounter Date: 03/30/2020   OT End of Session - 03/30/20 1513    Visit Number 14    Number of Visits 17    Date for OT Re-Evaluation 04/25/20    Authorization Type [employer]    Authorization - Visit Number 13    Progress Note Due on Visit 20    OT Start Time 1412    OT Stop Time 1455    OT Time Calculation (min) 43 min    Activity Tolerance Patient tolerated treatment well    Behavior During Therapy [facility] for tasks assessed/performed           Past Medical History:  Diagnosis Date  . Arthritis   . BPH (benign prostatic hyperplasia)   . Cardiomyopathy ([place])   . Chicken pox   . 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 DDD (degenerative disc disease), cervical   . Depression   . Elevated PSA   . Fracture of foot bone, right, closed 1964  . GERD (gastroesophageal reflux disease)   . Heart murmur   . Hyperlipidemia   . Polyneuropathy   . Rheumatic fever   . Seasonal allergies     Past Surgical History:  Procedure Laterality Date  . COLONOSCOPY     2006 normal exam  . PILONIDAL CYST EXCISION  1966  . WISDOM TOOTH EXTRACTION      There were no vitals filed for this visit.   Subjective Assessment - 03/30/20 1414    Subjective  I did a little test to determine if buttoning is faster - it is hard to button the right cuff    Currently in Pain? Yes    Pain Score 1     Pain Location Neck    Pain Orientation Left    Pain Descriptors / Indicators Aching;Numbness    Pain Type Chronic pain    Pain Onset More than a month ago    Pain Frequency Constant    Aggravating Factors  cold, excessive use    Pain Relieving Factors rest and gentle                         OT Treatments/Exercises (OP) - 03/30/20 0001      ADLs   UB Dressing Discussed buttoning ability.  Patient has improved movement throughout LUE, but numbness limits timeliness of task. Patient asking what can be done about numbness, and discussed compensatory versus remediating options.      Exercises   Exercises Hand      Hand Exercises   Other Hand Exercises Digiflex - individual and composite resitive flexion 1,3,5,7 lbs resistance      Ultrasound   Ultrasound Location volar wrist, palm, fingers, dorsal forearm    Ultrasound Parameters 5cm2, 79mhz, continuous, x 10 min 0.8w/cm2    Ultrasound Goals Edema;Pain                    OT Short Term Goals - 03/30/20 1515      OT SHORT TERM GOAL #1   Title Patient will complete HEP to address hand range of motion    Time 4    Period Weeks    Status Achieved  OT SHORT TERM GOAL #2   Time 4    Period Weeks    Status Achieved      OT SHORT TERM GOAL #3   Title Patient will demonstrate 5 lb increase in grip strength in left hand to aide in grip on hand tools.    Period Weeks    Status Achieved             OT Long Term Goals - 03/30/20 1515      OT LONG TERM GOAL #1   Title Patient will complete updated HEP to address proximal strength in BUE    Time 8    Period Weeks    Status On-going      OT LONG TERM GOAL #2   Title Patient will self report decreased time to button shirts using BUE method    Time 8    Period Weeks    Status Achieved      OT LONG TERM GOAL #3   Title Patient will demonstrate 3 second reduction in 9 hole peg test left UE    Baseline 31 sec intially, less than 23 sec today 12/7    Time 8    Period Weeks    Status Achieved                 Plan - 03/30/20 1513    Clinical Impression Statement Patient reports overall reduction in pain and stiffness, although still troubled by numbness in left hand    OT Occupational Profile and History Problem  Focused Assessment - Including review of records relating to presenting problem    Occupational performance deficits (Please refer to evaluation for details): IADL's;Rest and Sleep;Leisure    Body Structure / Function / Physical Skills ADL;Coordination;GMC;UE functional use;Sensation;Pain;IADL;Flexibility;Decreased knowledge of use of DME;Dexterity;FMC;Improper spinal/pelvic alignment;Strength;ROM    Rehab Potential Good    Clinical Decision Making Limited treatment options, no task modification necessary    Comorbidities Affecting Occupational Performance: None    Modification or Assistance to Complete Evaluation  No modification of tasks or assist necessary to complete eval    OT Frequency 2x / week    OT Duration 8 weeks    OT Treatment/Interventions Self-care/ADL training;Therapeutic exercise;Aquatic Therapy;Moist Heat;Neuromuscular education;Splinting;Patient/family education;Therapeutic activities;Fluidtherapy;Passive range of motion;Manual Therapy;DME and/or AE instruction;Cryotherapy;Ultrasound    Plan soft tissue mobilization, ultrasound left hand - volar wrist, forearm, palms, dorsal forearm    Recommended Other Services Neuropsych    Consulted and Agree with Plan of Care Patient           Patient will benefit from skilled therapeutic intervention in order to improve the following deficits and impairments:   Body Structure / Function / Physical Skills: ADL,Coordination,GMC,UE functional use,Sensation,Pain,IADL,Flexibility,Decreased knowledge of use of DME,Dexterity,FMC,Improper spinal/pelvic alignment,Strength,ROM       Visit Diagnosis: Pain in left arm  Stiffness of left hand, not elsewhere classified  Other lack of coordination  Chronic right shoulder pain  Other disturbances of skin sensation  Pain in left hand  Muscle weakness (generalized)    Problem List Patient Active Problem List   Diagnosis Date Noted  . Midline low back pain without sciatica 09/30/2019   . Atrophy of muscle of right lower leg 09/13/2019  . Lumbar radiculopathy 09/13/2019  . Peripheral neuropathy 09/13/2019  . Gait abnormality 08/19/2019  . Paresthesia 08/19/2019  . Neck pain 08/19/2019  . Memory loss 08/19/2019  . Chronic diastolic heart failure ([place]) 03/21/2016  . Murmur, cardiac 01/15/2016  . Dyspnea 01/15/2016  .  Abnormal ECG 01/15/2016  . Hypertriglyceridemia 01/15/2016    Mariah Milling, OTR/L 03/30/2020, 3:15 PM  [place] [address] [place], [place], [address] Phone: [phone]   Fax:  [phone]  Name: Marc Thornton MRN: [id number] Date of Birth: [birth year]

## 2020-04-04 ENCOUNTER — Ambulatory Visit: Payer: Medicare PPO | Admitting: Physical Therapy

## 2020-04-04 ENCOUNTER — Ambulatory Visit: Payer: Medicare PPO | Admitting: Occupational Therapy

## 2020-04-04 ENCOUNTER — Encounter: Payer: Self-pay | Admitting: Occupational Therapy

## 2020-04-04 ENCOUNTER — Other Ambulatory Visit: Payer: Self-pay

## 2020-04-04 DIAGNOSIS — R293 Abnormal posture: Secondary | ICD-10-CM

## 2020-04-04 DIAGNOSIS — M542 Cervicalgia: Secondary | ICD-10-CM

## 2020-04-04 DIAGNOSIS — M6281 Muscle weakness (generalized): Secondary | ICD-10-CM

## 2020-04-04 DIAGNOSIS — M79642 Pain in left hand: Secondary | ICD-10-CM

## 2020-04-04 DIAGNOSIS — R208 Other disturbances of skin sensation: Secondary | ICD-10-CM

## 2020-04-04 DIAGNOSIS — M25642 Stiffness of left hand, not elsewhere classified: Secondary | ICD-10-CM | POA: Diagnosis not present

## 2020-04-04 DIAGNOSIS — M25511 Pain in right shoulder: Secondary | ICD-10-CM | POA: Diagnosis not present

## 2020-04-04 DIAGNOSIS — R278 Other lack of coordination: Secondary | ICD-10-CM

## 2020-04-04 DIAGNOSIS — G8929 Other chronic pain: Secondary | ICD-10-CM | POA: Diagnosis not present

## 2020-04-04 DIAGNOSIS — M79602 Pain in left arm: Secondary | ICD-10-CM

## 2020-04-04 DIAGNOSIS — M5413 Radiculopathy, cervicothoracic region: Secondary | ICD-10-CM

## 2020-04-04 NOTE — Therapy (Signed)
[facility] [address] [place], [place], [address] Phone: [phone]   Fax:  [phone]  Physical Therapy Treatment  Patient Details  Name: Marc Thornton MRN: [id number] Date of Birth: December 03, 1945 Referring Provider (PT): Levert Feinstein, MD   Encounter Date: 04/04/2020   PT End of Session - 04/04/20 1438    Visit Number 3    Number of Visits 7    Date for PT Re-Evaluation 05/02/20    Authorization Type [employer]    PT Start Time 1320    PT Stop Time 1405    PT Time Calculation (min) 45 min    Activity Tolerance Patient tolerated treatment well    Behavior During Therapy [facility] for tasks assessed/performed           Past Medical History:  Diagnosis Date   Arthritis    BPH (benign prostatic hyperplasia)    Cardiomyopathy (HCC)    Chicken pox    Clotting disorder (H[facility])    low platelets   DDD (degenerative disc disease), cervical    Depression    Elevated PSA    Fracture of foot bone, right, closed 1964   GERD (gastroesophageal reflux disease)    Heart murmur    Hyperlipidemia    Polyneuropathy    Rheumatic fever    Seasonal allergies     Past Surgical History:  Procedure Laterality Date   COLONOSCOPY     2006 normal exam   PILONIDAL CYST EXCISION  1966   WISDOM TOOTH EXTRACTION      There were no vitals filed for this visit.   Subjective Assessment - 04/04/20 1441    Subjective Pt reports he lost his HEP and has not been able to find it. He comes into clinic with his theracane. He states his neck has felt very stiff.    Diagnostic tests MRI: 1.   At C3-C4, degenerative changes combine to cause mild spinal stenosis and moderately severe bilateral foraminal narrowing with potential to compress either of the C4 nerve roots.  2.   At C4-C5, degenerative changes combine to cause mild spinal stenosis and moderately severe right and moderate left foraminal narrowing with potential for  right C5 nerve root compression.  3.   At C5-C6, there is mild spinal stenosis and moderate foraminal narrowing but no nerve root compression.  4.   Partial fusion of the C6 and C7 vertebral bodies.  This could be degenerative in nature.  5.   The spinal cord appears normal.    Patient Stated Goals Improve pain, strength + ROM                             [facility] Adult PT Treatment/Exercise - 04/04/20 1442      Neck Exercises: Supine   Neck Retraction 10 reps    Cervical Rotation Both;10 reps    Lateral Flexion Both;10 reps      Manual Therapy   Manual Therapy Soft tissue mobilization;Joint mobilization    Manual therapy comments suboccipital release 2x30 sec, cervical traction 2x30 sec, PROM for rotation and side flexion bilat 2x30 sec    Joint Mobilization Grade III to IV cervical mobilization for rotation, side flexion and extension through all spinal segments    Soft tissue mobilization TPR suboccipitals, L upper trap and L scalenes; STW upper trap on L      Neck Exercises: Stretches   Upper Trapezius Stretch Right;Left;30 seconds  Other Neck Stretches Towel stretch for neck extension + side flexion 5x5 sec hold bilat    Other Neck Stretches SNAGs for cervical rotation 5x 5 sec holds bilat                  PT Education - 04/04/20 1437    Education Details Discussed using theracane and heat. Discussed his HEP.    Person(s) Educated Patient    Methods Explanation    Comprehension Verbalized understanding            PT Short Term Goals - 03/21/20 1552      PT SHORT TERM GOAL #1   Title Pt will be independent with his initial HEP to decrease pain and improved flexibility.    Time 3    Period Weeks    Status New    Target Date 04/11/20      PT SHORT TERM GOAL #2   Title Pt will demo improved cervical ROM by at least 10 deg for side flexion and rotation    Time 3    Period Weeks    Status New    Target Date 04/11/20             PT Long  Term Goals - 03/21/20 1556      PT LONG TERM GOAL #1   Title Pt will be indepedent with HEP and demonstrate good understanding of self progressions of exericse for completion at discharge.    Time 6    Period Weeks    Status New    Target Date 05/02/20      PT LONG TERM GOAL #2   Title Pt will demo improved cervical ROM by at least 20 deg for side flexion and rotation    Time 6    Period Weeks    Status New    Target Date 05/02/20      PT LONG TERM GOAL #3   Title Pt will report 50% decrease in his neck pain from baseline    Time 6    Period Weeks    Status New    Target Date 05/02/20                 Plan - 04/04/20 1434    Clinical Impression Statement Pt lost his HEP paper. Session focused on reviewing pt's HEP and improving cervical ROM via manual therapy, stretching, and strengthening. Educated pt on use of theracane. Pt able to demo at least 10 more degrees of rotation and 5 deg more of lateral flexion by end of session.    Personal Factors and Comorbidities Age;Time since onset of injury/illness/exacerbation;Fitness    Examination-Activity Limitations Sleep;Reach Overhead;Lift;Bend    Examination-Participation Restrictions Community Activity;Yard Work    Stability/Clinical Decision Making Stable/Uncomplicated    Rehab Potential Good    PT Frequency 1x / week    PT Duration 6 weeks    PT Treatment/Interventions Cryotherapy;ADLs/Self Care Home Management;Electrical Stimulation;Iontophoresis 4mg /ml Dexamethasone;Moist Heat;Traction;Ultrasound;Functional mobility training;Therapeutic activities;Therapeutic exercise;Neuromuscular re-education;Patient/family education;Manual techniques;Passive range of motion;Dry needling;Taping;Spinal Manipulations;Joint Manipulations;Vestibular    PT Next Visit Plan Manual therapy and stretching as indicated to improve cervical/thoracic ROM; progress postural strengthening (scap retractions, scapular strengthening, neck retraction in  sitting/standing).    PT Home Exercise Plan Access Code: [email or web address]    Consulted and Agree with Plan of Care Patient           Patient will benefit from skilled therapeutic intervention in order to improve the following deficits and impairments:  Decreased  range of motion,Impaired UE functional use,Decreased activity tolerance,Pain,Hypomobility,Impaired flexibility,Improper body mechanics,Decreased mobility,Decreased strength,Postural dysfunction  Visit Diagnosis: Cervicalgia  Radiculopathy, cervicothoracic region  Abnormal posture  Muscle weakness (generalized)     Problem List Patient Active Problem List   Diagnosis Date Noted   Midline low back pain without sciatica 09/30/2019   Atrophy of muscle of right lower leg 09/13/2019   Lumbar radiculopathy 09/13/2019   Peripheral neuropathy 09/13/2019   Gait abnormality 08/19/2019   Paresthesia 08/19/2019   Neck pain 08/19/2019   Memory loss 08/19/2019   Chronic diastolic heart failure (HCC) 03/21/2016   Murmur, cardiac 01/15/2016   Dyspnea 01/15/2016   Abnormal ECG 01/15/2016   Hypertriglyceridemia 01/15/2016    Johnelle Tafolla April Ma L Beatriz Quintela PT, DPT 04/04/2020, 2:45 PM  [facility] [address] [place], [place], [address] Phone: [phone]   Fax:  [phone]  Name: Marc Thornton MRN: [id number] Date of Birth: 1945-05-02

## 2020-04-04 NOTE — Therapy (Signed)
[facility] [address] [place], [place], [address] Phone: [phone]   Fax:  [phone]  Occupational Therapy Treatment and Discharge Summary  Patient Details  Name: Marc Thornton MRN: [id number] Date of Birth: 1946-02-25 Referring Provider (OT): Dr Krista Blue   Encounter Date: 04/04/2020   OT End of Session - 04/04/20 1439    Visit Number 15    Number of Visits 17    Date for OT Re-Evaluation 04/25/20    Authorization Type [employer]    Authorization - Visit Number 14    Progress Note Due on Visit 20    OT Start Time 1400    OT Stop Time 1430    OT Time Calculation (min) 30 min    Activity Tolerance Patient tolerated treatment well    Behavior During Therapy [facility] for tasks assessed/performed           Past Medical History:  Diagnosis Date  . Arthritis   . BPH (benign prostatic hyperplasia)   . Cardiomyopathy ([place])   . Chicken pox   . 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 DDD (degenerative disc disease), cervical   . Depression   . Elevated PSA   . Fracture of foot bone, right, closed 1964  . GERD (gastroesophageal reflux disease)   . Heart murmur   . Hyperlipidemia   . Polyneuropathy   . Rheumatic fever   . Seasonal allergies     Past Surgical History:  Procedure Laterality Date  . COLONOSCOPY     2006 normal exam  . PILONIDAL CYST EXCISION  1966  . WISDOM TOOTH EXTRACTION      There were no vitals filed for this visit.   Subjective Assessment - 04/04/20 1433    Subjective  That has not been a problem as of late.  (regarding hands on steering wheel)                        OT Treatments/Exercises (OP) - 04/04/20 0001      ADLs   Driving Reports inconsistent ability to maintain left hand on steering wheel, although recently this has not been an issue.    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  He has adjusted his  style of digging to allow right hand to lift spade and left hand more as guide.  This and reducing time to about an hour has been somewhat helpful - although he frequently reports pain in back of forearm after heavy or repetitve work.    ADL Comments Reviewed remaining goals and plan for discharge as this last scheduled appointment.  Patient still with numbness in fingertips - seems to be multifactoral - neuropathy, carpal tunnel, and likely vascular limitations.  Patient wanting to pursue medical direction to address small vessel issues.      Moist Heat Therapy   Number Minutes Moist Heat 15 Minutes    Moist Heat Location Cervical   following PT session                 OT Education - 04/04/20 1438    Education Details reviewed remaining goals.  Patient agreeable to OT discharge    Person(s) Educated Patient    Methods Explanation    Comprehension Verbalized understanding            OT Short Term Goals - 04/04/20 1440  OT SHORT TERM GOAL #1   Title Patient will complete HEP to address hand range of motion    Time 4    Period Weeks    Status Achieved    Target Date 03/26/20      OT SHORT TERM GOAL #2   Title Patient will report ability to sleep for longer periods of time due to decreased shoulder pain    Time 4    Period Weeks    Status Achieved      OT SHORT TERM GOAL #3   Title Patient will demonstrate 5 lb increase in grip strength in left hand to aide in grip on hand tools.    Time 4    Period Weeks    Status Achieved             OT Long Term Goals - 04/04/20 1446      OT LONG TERM GOAL #1   Title Patient will complete updated HEP to address proximal strength in BUE    Time 8    Period Weeks    Status Deferred      OT LONG TERM GOAL #2   Title Patient will self report decreased time to button shirts using BUE method    Time 8    Period Weeks    Status Achieved      OT LONG TERM GOAL #3   Title Patient will demonstrate 3 second reduction in 9  hole peg test left UE    Baseline 31 sec intially, less than 23 sec today 12/7    Time 8    Period Weeks    Status Achieved      OT LONG TERM GOAL #4   Title Patient will report improved comfort and ability to maintain hands on steering wheel while driving for 10 min or greater.    Time 8    Period Weeks    Status Achieved                 Plan - 04/04/20 1439    Clinical Impression Statement Patient reports overall reduction in pain and stiffness, although still troubled by numbness in left hand    OT Occupational Profile and History Problem Focused Assessment - Including review of records relating to presenting problem    Occupational performance deficits (Please refer to evaluation for details): IADL's;Rest and Sleep;Leisure    Body Structure / Function / Physical Skills ADL;Coordination;GMC;UE functional use;Sensation;Pain;IADL;Flexibility;Decreased knowledge of use of DME;Dexterity;FMC;Improper spinal/pelvic alignment;Strength;ROM    Rehab Potential Good    Clinical Decision Making Limited treatment options, no task modification necessary    Comorbidities Affecting Occupational Performance: None    Modification or Assistance to Complete Evaluation  No modification of tasks or assist necessary to complete eval    OT Frequency 2x / week    OT Duration 8 weeks    OT Treatment/Interventions Self-care/ADL training;Therapeutic exercise;Aquatic Therapy;Moist Heat;Neuromuscular education;Splinting;Patient/family education;Therapeutic activities;Fluidtherapy;Passive range of motion;Manual Therapy;DME and/or AE instruction;Cryotherapy;Ultrasound    Plan discharge OT    Recommended Other Services Neuropsych    Consulted and Agree with Plan of Care Patient           Patient will benefit from skilled therapeutic intervention in order to improve the following deficits and impairments:   Body Structure / Function / Physical Skills: ADL,Coordination,GMC,UE functional  use,Sensation,Pain,IADL,Flexibility,Decreased knowledge of use of DME,Dexterity,FMC,Improper spinal/pelvic alignment,Strength,ROM       Visit Diagnosis: Pain in left arm  Stiffness of left hand, not elsewhere classified  Other lack of coordination  Chronic right shoulder pain  Other disturbances of skin sensation  Pain in left hand  Muscle weakness (generalized)    Problem List Patient Active Problem List   Diagnosis Date Noted  . Midline low back pain without sciatica 09/30/2019  . Atrophy of muscle of right lower leg 09/13/2019  . Lumbar radiculopathy 09/13/2019  . Peripheral neuropathy 09/13/2019  . Gait abnormality 08/19/2019  . Paresthesia 08/19/2019  . Neck pain 08/19/2019  . Memory loss 08/19/2019  . Chronic diastolic heart failure ([place]) 03/21/2016  . Murmur, cardiac 01/15/2016  . Dyspnea 01/15/2016  . Abnormal ECG 01/15/2016  . Hypertriglyceridemia 01/15/2016   OCCUPATIONAL THERAPY DISCHARGE SUMMARY  Visits from Start of Care: 15  Current functional level related to goals / functional outcomes: Improved left hand pain and range of motion   Remaining deficits: Numbness left fingertips and intermittent burning/pain   Education / Equipment: HEP for hand strengthening and range of motion Plan: Patient agrees to discharge.  Patient goals were met. Patient is being discharged due to meeting the stated rehab goals.  ?????     Mariah Milling, OTR/L 04/04/2020, 2:48 PM  [facility] [address] [place], [place], [address] Phone: [phone]   Fax:  [phone]  Name: Marc Thornton MRN: [id number] Date of Birth: 1945/12/21

## 2020-04-06 ENCOUNTER — Encounter: Payer: Medicare PPO | Admitting: Occupational Therapy

## 2020-04-09 ENCOUNTER — Encounter: Payer: Self-pay | Admitting: Adult Health

## 2020-04-09 ENCOUNTER — Encounter: Payer: Self-pay | Admitting: Physical Therapy

## 2020-04-11 ENCOUNTER — Ambulatory Visit: Payer: Medicare PPO | Admitting: Physical Therapy

## 2020-04-13 ENCOUNTER — Ambulatory Visit: Payer: Medicare PPO | Admitting: Physical Therapy

## 2020-04-18 ENCOUNTER — Ambulatory Visit: Payer: Medicare PPO | Admitting: Physical Therapy

## 2020-04-20 ENCOUNTER — Ambulatory Visit: Payer: Medicare PPO | Admitting: Physical Therapy

## 2020-04-25 ENCOUNTER — Ambulatory Visit: Payer: Medicare PPO | Admitting: Physical Therapy

## 2020-04-27 ENCOUNTER — Ambulatory Visit: Payer: Medicare PPO | Admitting: Physical Therapy

## 2020-06-19 ENCOUNTER — Institutional Professional Consult (permissible substitution): Payer: No Typology Code available for payment source | Admitting: Neurology

## 2020-06-19 ENCOUNTER — Encounter: Payer: Self-pay | Admitting: Neurology

## 2020-06-19 ENCOUNTER — Telehealth: Payer: Self-pay | Admitting: *Deleted

## 2020-06-19 NOTE — Telephone Encounter (Signed)
No showed consult appointment. 

## 2020-06-20 ENCOUNTER — Encounter: Payer: Self-pay | Admitting: Adult Health

## 2020-06-26 ENCOUNTER — Encounter: Payer: Self-pay | Admitting: Adult Health

## 2020-06-26 NOTE — Telephone Encounter (Signed)
See MyChart messages.

## 2020-06-27 ENCOUNTER — Other Ambulatory Visit: Payer: Self-pay

## 2020-06-27 ENCOUNTER — Encounter: Payer: Self-pay | Admitting: Adult Health

## 2020-06-27 ENCOUNTER — Ambulatory Visit: Payer: Medicare PPO | Admitting: Adult Health

## 2020-06-27 VITALS — BP 130/70 | HR 65 | Temp 97.9°F | Wt 194.6 lb

## 2020-06-27 DIAGNOSIS — M542 Cervicalgia: Secondary | ICD-10-CM

## 2020-06-27 DIAGNOSIS — L309 Dermatitis, unspecified: Secondary | ICD-10-CM

## 2020-06-27 NOTE — Progress Notes (Signed)
   Subjective:    Patient ID: Marc Thornton, male    DOB: 1945-05-09, 75 y.o.   MRN: [id number]  HPI    Review of Systems     Objective:   Physical Exam        Assessment & Plan:

## 2020-06-27 NOTE — Progress Notes (Signed)
Subjective:    Patient ID: Marc Thornton, male    DOB: [birth year], 75 y.o.   MRN: [id number]  HPI 75 year old male who  has a past medical history of Arthritis, BPH (benign prostatic hyperplasia), Cardiomyopathy ([place]), Chicken pox, Clotting disorder ([place]), DDD (degenerative disc disease), cervical, Depression, Elevated PSA, Fracture of foot bone, right, closed (1964), GERD (gastroesophageal reflux disease), Heart murmur, Hyperlipidemia, Polyneuropathy, Rheumatic fever, and Seasonal allergies.  He presents to the office today for an acute issue.  2 days ago while clearing honeysuckle on the steep incline his feet became tangled and he fell backwards down the hill, hitting his head on the ground when he landed.  He endorses very mild headache, loss of range of motion of his neck, and mild discomfort at the base of his skull.  He did not lose consciousness when he hit his head, denies numbness or tingling in his upper extremities, blurred vision.  Does feel as though the range of motion is improving slightly over the last 24 hours.  At home he has been treating with heat and cold as well as Aleve with some improvement.  He also has a red papular rash noted on his left lower leg. + itching. Has been present for about 6 weeks. Has tried Silvadene cream without improvement  Review of Systems See HPI   Past Medical History:  Diagnosis Date  . Arthritis   . BPH (benign prostatic hyperplasia)   . Cardiomyopathy ([place])   . Chicken pox   . 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 DDD (degenerative disc disease), cervical   . Depression   . Elevated PSA   . Fracture of foot bone, right, closed 1964  . GERD (gastroesophageal reflux disease)   . Heart murmur   . Hyperlipidemia   . Polyneuropathy   . Rheumatic fever   . Seasonal allergies     Social History   Socioeconomic History  . Marital status: Widowed    Spouse name: Not on file  . Number of children: Not on file  . Years of  education: Not on file  . Highest education level: Not on file  Occupational History  . Not on file  Tobacco Use  . Smoking status: Former Research scientist (life sciences)  . Smokeless tobacco: Former Network engineer and Sexual Activity  . Alcohol use: Yes    Comment: occ.  . Drug use: No  . Sexual activity: Not on file  Other Topics Concern  . Not on file  Social History Narrative   Worked as a Community education officer    Married      Social Determinants of Radio broadcast assistant Strain: Not on file  Food Insecurity: Not on file  Transportation Needs: Not on file  Physical Activity: Not on file  Stress: Not on file  Social Connections: Not on file  Intimate Partner Violence: Not on file    Past Surgical History:  Procedure Laterality Date  . COLONOSCOPY     2006 normal exam  . PILONIDAL CYST EXCISION  1966  . WISDOM TOOTH EXTRACTION      Family History  Problem Relation Age of Onset  . Arthritis Mother   . Breast cancer Mother   . Arthritis Father   . Heart disease Father   . Stroke Father   . Hypertension Father   . Prostate cancer Father   . Dementia Father   . Stroke Paternal Grandfather   . Colon cancer Neg Hx   .  Esophageal cancer Neg Hx   . Stomach cancer Neg Hx   . Rectal cancer Neg Hx     No Known Allergies  Current Outpatient Medications on File Prior to Visit  Medication Sig Dispense Refill  . acetaminophen (TYLENOL) 500 MG tablet Take 500 mg by mouth every 6 (six) hours as needed.    . Betaine, Trimethylglycine, (TMG, TRIMETHYLGLYCINE, PO)     . Cholecalciferol (VITAMIN D3) 25 MCG (1000 UT) CAPS Take by mouth. Takes 3-4 times a week     No current facility-administered medications on file prior to visit.    BP 130/70 (BP Location: Left Arm, Patient Position: Sitting, Cuff Size: Normal)   Pulse 65   Temp 97.9 F (36.6 C) (Oral)   Wt 194 lb 9.6 oz (88.3 kg)   SpO2 95%   BMI 26.03 kg/m       Objective:   Physical Exam Vitals and nursing note reviewed.   Constitutional:      Appearance: Normal appearance.  HENT:     Right Ear: Tympanic membrane, ear canal and external ear normal.     Left Ear: Tympanic membrane and external ear normal.  Eyes:     Extraocular Movements: Extraocular movements intact.     Conjunctiva/sclera: Conjunctivae normal.     Pupils: Pupils are equal, round, and reactive to light.  Musculoskeletal:     Cervical back: Crepitus present. No swelling, edema, deformity, torticollis or bony tenderness. Pain with movement present. Decreased range of motion.     Comments: He does have some decreased range of motion with horizontal movements.  Trapezius feel tight.  Spinal tenderness, deformity, edema noted  Skin:    General: Skin is warm and dry.     Findings: Rash present. Rash is papular.       Neurological:     General: No focal deficit present.     Mental Status: He is alert and oriented to person, place, and time.  Psychiatric:        Mood and Affect: Mood normal.        Behavior: Behavior normal.        Thought Content: Thought content normal.        Judgment: Judgment normal.       Assessment & Plan:  1. Neck pain -Likely soft tissue injury. -Both agreed to hold off on x-ray at this time.  If symptoms do not improve over the next 7 to 10 days and he will follow-up. -Continue with anti-inflammatories and alternating heat and ice - Can use muscle relaxers he has at home   2. Dermatitis -Appears as an allergic rash.  Advise apical cortisone cream.  [name], NP

## 2020-07-13 ENCOUNTER — Institutional Professional Consult (permissible substitution): Payer: No Typology Code available for payment source | Admitting: Neurology

## 2020-08-16 ENCOUNTER — Encounter: Payer: Self-pay | Admitting: *Deleted

## 2020-08-16 ENCOUNTER — Ambulatory Visit (INDEPENDENT_AMBULATORY_CARE_PROVIDER_SITE_OTHER): Payer: Medicare PPO

## 2020-08-16 ENCOUNTER — Encounter: Payer: Self-pay | Admitting: Adult Health

## 2020-08-16 ENCOUNTER — Other Ambulatory Visit: Payer: Self-pay

## 2020-08-16 DIAGNOSIS — Z0000 Encounter for general adult medical examination without abnormal findings: Secondary | ICD-10-CM

## 2020-08-16 NOTE — Patient Instructions (Addendum)
[name] , Thank you for taking time to come for your Medicare Wellness Visit. I appreciate your ongoing commitment to your health goals. Please review the following plan we discussed and let me know if I can assist you in the future.   Screening recommendations/referrals: Colonoscopy: Done 12/29/18 Recommended yearly ophthalmology/optometry visit for glaucoma screening and checkup Recommended yearly dental visit for hygiene and checkup  Vaccinations: Influenza vaccine: Up to date Pneumococcal vaccine: Up to date Tdap vaccine: Up to date Shingles vaccine: Shingrix discussed. Please contact your pharmacy for coverage information.    Covid-19: Completed 1/12, 2/9, 03/12/20  Advanced directives: Please bring a copy of your health care power of attorney and living will to the office at your convenience.  Conditions/risks identified: Continue walking exercise   Next appointment: Follow up in one year for your annual wellness visit.   Preventive Care 66 Years and Older, Male Preventive care refers to lifestyle choices and visits with your health care provider that can promote health and wellness. What does preventive care include?  A yearly physical exam. This is also called an annual well check.  Dental exams once or twice a year.  Routine eye exams. Ask your health care provider how often you should have your eyes checked.  Personal lifestyle choices, including:  Daily care of your teeth and gums.  Regular physical activity.  Eating a healthy diet.  Avoiding tobacco and drug use.  Limiting alcohol use.  Practicing safe sex.  Taking low doses of aspirin every day.  Taking vitamin and mineral supplements as recommended by your health care provider. What happens during an annual well check? The services and screenings done by your health care provider during your annual well check will depend on your age, overall health, lifestyle risk factors, and family history of  disease. Counseling  Your health care provider may ask you questions about your:  Alcohol use.  Tobacco use.  Drug use.  Emotional well-being.  Home and relationship well-being.  Sexual activity.  Eating habits.  History of falls.  Memory and ability to understand (cognition).  Work and work Statistician. Screening  You may have the following tests or measurements:  Height, weight, and BMI.  Blood pressure.  Lipid and cholesterol levels. These may be checked every 5 years, or more frequently if you are over 59 years old.  Skin check.  Lung cancer screening. You may have this screening every year starting at age 11 if you have a 30-pack-year history of smoking and currently smoke or have quit within the past 15 years.  Fecal occult blood test (FOBT) of the stool. You may have this test every year starting at age 61.  Flexible sigmoidoscopy or colonoscopy. You may have a sigmoidoscopy every 5 years or a colonoscopy every 10 years starting at age 38.  Prostate cancer screening. Recommendations will vary depending on your family history and other risks.  Hepatitis C blood test.  Hepatitis B blood test.  Sexually transmitted disease (STD) testing.  Diabetes screening. This is done by checking your blood sugar (glucose) after you have not eaten for a while (fasting). You may have this done every 1-3 years.  Abdominal aortic aneurysm (AAA) screening. You may need this if you are a current or former smoker.  Osteoporosis. You may be screened starting at age 62 if you are at high risk. Talk with your health care provider about your test results, treatment options, and if necessary, the need for more tests. Vaccines  Your  health care provider may recommend certain vaccines, such as:  Influenza vaccine. This is recommended every year.  Tetanus, diphtheria, and acellular pertussis (Tdap, Td) vaccine. You may need a Td booster every 10 years.  Zoster vaccine. You may  need this after age 61.  Pneumococcal 13-valent conjugate (PCV13) vaccine. One dose is recommended after age 47.  Pneumococcal polysaccharide (PPSV23) vaccine. One dose is recommended after age 73. Talk to your health care provider about which screenings and vaccines you need and how often you need them. This information is not intended to replace advice given to you by your health care provider. Make sure you discuss any questions you have with your health care provider. Document Released: 04/21/2015 Document Revised: 12/13/2015 Document Reviewed: 01/24/2015 Elsevier Interactive Patient Education  2017 [place] Prevention in the Home Falls can cause injuries. They can happen to people of all ages. There are many things you can do to make your home safe and to help prevent falls. What can I do on the outside of my home?  Regularly fix the edges of walkways and driveways and fix any cracks.  Remove anything that might make you trip as you walk through a door, such as a raised step or threshold.  Trim any bushes or trees on the path to your home.  Use bright outdoor lighting.  Clear any walking paths of anything that might make someone trip, such as rocks or tools.  Regularly check to see if handrails are loose or broken. Make sure that both sides of any steps have handrails.  Any raised decks and porches should have guardrails on the edges.  Have any leaves, snow, or ice cleared regularly.  Use sand or salt on walking paths during winter.  Clean up any spills in your garage right away. This includes oil or grease spills. What can I do in the bathroom?  Use night lights.  Install grab bars by the toilet and in the tub and shower. Do not use towel bars as grab bars.  Use non-skid mats or decals in the tub or shower.  If you need to sit down in the shower, use a plastic, non-slip stool.  Keep the floor dry. Clean up any water that spills on the floor as soon as it  happens.  Remove soap buildup in the tub or shower regularly.  Attach bath mats securely with double-sided non-slip rug tape.  Do not have throw rugs and other things on the floor that can make you trip. What can I do in the bedroom?  Use night lights.  Make sure that you have a light by your bed that is easy to reach.  Do not use any sheets or blankets that are too big for your bed. They should not hang down onto the floor.  Have a firm chair that has side arms. You can use this for support while you get dressed.  Do not have throw rugs and other things on the floor that can make you trip. What can I do in the kitchen?  Clean up any spills right away.  Avoid walking on wet floors.  Keep items that you use a lot in easy-to-reach places.  If you need to reach something above you, use a strong step stool that has a grab bar.  Keep electrical cords out of the way.  Do not use floor polish or wax that makes floors slippery. If you must use wax, use non-skid floor wax.  Do not  have throw rugs and other things on the floor that can make you trip. What can I do with my stairs?  Do not leave any items on the stairs.  Make sure that there are handrails on both sides of the stairs and use them. Fix handrails that are broken or loose. Make sure that handrails are as long as the stairways.  Check any carpeting to make sure that it is firmly attached to the stairs. Fix any carpet that is loose or worn.  Avoid having throw rugs at the top or bottom of the stairs. If you do have throw rugs, attach them to the floor with carpet tape.  Make sure that you have a light switch at the top of the stairs and the bottom of the stairs. If you do not have them, ask someone to add them for you. What else can I do to help prevent falls?  Wear shoes that:  Do not have high heels.  Have rubber bottoms.  Are comfortable and fit you well.  Are closed at the toe. Do not wear sandals.  If you  use a stepladder:  Make sure that it is fully opened. Do not climb a closed stepladder.  Make sure that both sides of the stepladder are locked into place.  Ask someone to hold it for you, if possible.  Clearly mark and make sure that you can see:  Any grab bars or handrails.  First and last steps.  Where the edge of each step is.  Use tools that help you move around (mobility aids) if they are needed. These include:  Canes.  Walkers.  Scooters.  Crutches.  Turn on the lights when you go into a dark area. Replace any light bulbs as soon as they burn out.  Set up your furniture so you have a clear path. Avoid moving your furniture around.  If any of your floors are uneven, fix them.  If there are any pets around you, be aware of where they are.  Review your medicines with your doctor. Some medicines can make you feel dizzy. This can increase your chance of falling. Ask your doctor what other things that you can do to help prevent falls. This information is not intended to replace advice given to you by your health care provider. Make sure you discuss any questions you have with your health care provider. Document Released: 01/19/2009 Document Revised: 08/31/2015 Document Reviewed: 04/29/2014 Elsevier Interactive Patient Education  2017 [employer].

## 2020-08-16 NOTE — Progress Notes (Addendum)
Virtual Visit via Telephone Note  I connected with  [name] on 08/16/20 at  1:45 PM EDT by telephone and verified that I am speaking with the correct person using two identifiers.  Medicare Annual Wellness visit completed telephonically due to Covid-19 pandemic.   Persons participating in this call: This Health Coach and this patient.   Location: Patient: Home Provider: Office   I discussed the limitations, risks, security and privacy concerns of performing an evaluation and management service by telephone and the availability of in person appointments. The patient expressed understanding and agreed to proceed.  Unable to perform video visit due to video visit attempted and failed and/or patient does not have video capability.   Some vital signs may be absent or patient reported.   Willette Brace, LPN    Subjective:   SALAR MOLDEN is a 75 y.o. male who presents for an Initial Medicare Annual Wellness Visit.  Review of Systems     Cardiac Risk Factors include: advanced age (>72men, >65 women);male gender     Objective:    There were no vitals filed for this visit. There is no height or weight on file to calculate BMI.  Advanced Directives 08/16/2020 05/13/2017 04/09/2017  Does Patient Have a Medical Advance Directive? Yes No No  Type of Advance Directive [place] in Chart? No - copy requested - -  Would patient like information on creating a medical advance directive? - No - Patient declined -    Current Medications (verified) Outpatient Encounter Medications as of 08/16/2020  Medication Sig  . Betaine, Trimethylglycine, (TMG, TRIMETHYLGLYCINE, PO) Take by mouth. Taking 2 capsules  . cholecalciferol (VITAMIN D3) 25 MCG (1000 UNIT) tablet Take 1,000 Units by mouth daily.  Marland Kitchen ibuprofen (ADVIL) 200 MG tablet Take 200 mg by mouth as needed.  Marland Kitchen ketoconazole (NIZORAL) 2 % shampoo Apply 1 application topically as  directed.  . triamcinolone cream (KENALOG) 0.1 % Apply 1 application topically daily as needed.  . [DISCONTINUED] acetaminophen (TYLENOL) 500 MG tablet Take 500 mg by mouth every 6 (six) hours as needed.  . [DISCONTINUED] Betaine, Trimethylglycine, (TMG, TRIMETHYLGLYCINE, PO)   . [DISCONTINUED] Cholecalciferol (VITAMIN D3) 25 MCG (1000 UT) CAPS Take by mouth. Takes 3-4 times a week   No facility-administered encounter medications on file as of 08/16/2020.    Allergies (verified) Patient has no known allergies.   History: Past Medical History:  Diagnosis Date  . Abnormality of gait   . Arthritis   . Atrial fibrillation ([place])   . BPH (benign prostatic hyperplasia)   . Cardiomyopathy ([place])   . Cervical disc disorder   . Chicken pox   . Closed fracture of one or more phalanges of foot   . 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 Combined systolic and diastolic cardiac dysfunction   . DDD (degenerative disc disease), cervical   . Depression   . Elevated PSA   . Fracture of foot bone, right, closed 1964  . GERD (gastroesophageal reflux disease)   . Hammer toe   . Hearing loss   . Heart murmur   . Hyperlipidemia   . Neoplasm of uncertain behavior of skin   . Pilonidal cyst with abscess   . Polyneuropathy   . Rheumatic fever   . Seasonal allergies   . Talipes cavus   . Tinnitus    Past Surgical History:  Procedure Laterality Date  .  COLONOSCOPY     2006 normal exam  . INGUINAL HERNIA REPAIR    . PILONIDAL CYST EXCISION  1966  . WISDOM TOOTH EXTRACTION     Family History  Problem Relation Age of Onset  . Arthritis Mother   . Breast cancer Mother   . Arthritis Father   . Heart disease Father   . Stroke Father   . Hypertension Father   . Prostate cancer Father   . Dementia Father   . Stroke Paternal Grandfather   . Colon cancer Neg Hx   . Esophageal cancer Neg Hx   . Stomach cancer Neg Hx   . Rectal cancer Neg Hx    Social History   Socioeconomic History   . Marital status: Widowed    Spouse name: Not on file  . Number of children: Not on file  . Years of education: Not on file  . Highest education level: Not on file  Occupational History  . Occupation: Retired  Tobacco Use  . Smoking status: Former Games developer  . Smokeless tobacco: Former Engineer, water and Sexual Activity  . Alcohol use: Not Currently  . Drug use: No  . Sexual activity: Not on file  Other Topics Concern  . Not on file  Social History Narrative      Social Determinants of Health   Financial Resource Strain: Low Risk   . Difficulty of Paying Living Expenses: Not hard at all  Food Insecurity: No Food Insecurity  . Worried About Programme researcher, broadcasting/film/video in the Last Year: Never true  . Ran Out of Food in the Last Year: Never true  Transportation Needs: No Transportation Needs  . Lack of Transportation (Medical): No  . Lack of Transportation (Non-Medical): No  Physical Activity: Sufficiently Active  . Days of Exercise per Week: 4 days  . Minutes of Exercise per Session: 60 min  Stress: No Stress Concern Present  . Feeling of Stress : Not at all  Social Connections: Socially Isolated  . Frequency of Communication with Friends and Family: More than three times a week  . Frequency of Social Gatherings with Friends and Family: Once a week  . Attends Religious Services: Never  . Active Member of Clubs or Organizations: No  . Attends Banker Meetings: Never  . Marital Status: Widowed    Tobacco Counseling Counseling given: Not Answered   Clinical Intake:  Pre-visit preparation completed: Yes  Pain : No/denies pain     BMI - recorded: 26.03 Nutritional Status: BMI 25 -29 Overweight Nutritional Risks: None Diabetes: No  How often do you need to have someone help you when you read instructions, pamphlets, or other written materials from your doctor or pharmacy?: 1 - Never  Diabetic?No  Interpreter Needed?: No  Information entered by :: Lanier Ensign, LPN   Activities of Daily Living In your present state of health, do you have any difficulty performing the following activities: 08/16/2020  Hearing? Y  Comment mild loss  Vision? N  Difficulty concentrating or making decisions? Y  Comment memory at times  Walking or climbing stairs? N  Dressing or bathing? N  Doing errands, shopping? N  Preparing Food and eating ? N  Using the Toilet? N  In the past six months, have you accidently leaked urine? Y  Comment at times incontiience  Do you have problems with loss of bowel control? N  Managing your Medications? N  Managing your Finances? N  Housekeeping or managing your Housekeeping?  N  Some recent data might be hidden    Patient Care Team: Sheela Stack, MD as PCP - General (Family Medicine) Croitoru, Dani Gobble, MD as PCP - Cardiology (Cardiology)  Indicate any recent Medical Services you may have received from other than [facility] providers in the past year (date may be approximate).     Assessment:   This is a routine wellness examination for [place].  Hearing/Vision screen  Hearing Screening   125Hz  250Hz  500Hz  1000Hz  2000Hz  3000Hz  4000Hz  6000Hz  8000Hz   Right ear:           Left ear:           Comments: Pt stated mild loss  Vision Screening Comments: Encouraged pt to follow up with the [facility] for annual eye exams  Dietary issues and exercise activities discussed: Current Exercise Habits: Home exercise routine, Type of exercise: walking, Time (Minutes): 60, Frequency (Times/Week): 4, Weekly Exercise (Minutes/Week): 240  Goals Addressed            This Visit's Progress   . Patient Stated       Continue walking for exercise      Depression Screen PHQ 2/9 Scores 08/16/2020 03/01/2020  PHQ - 2 Score 0 0    Fall Risk Fall Risk  08/16/2020  Falls in the past year? 1  Number falls in past yr: 1  Injury with Fall? 1  Risk for fall due to : Impaired balance/gait;Impaired vision  Follow up Falls prevention discussed     FALL RISK PREVENTION PERTAINING TO THE HOME:  Any stairs in or around the home? Yes  If so, are there any without handrails? Yes  Home free of loose throw rugs in walkways, pet beds, electrical cords, etc? Yes  Adequate lighting in your home to reduce risk of falls? Yes   ASSISTIVE DEVICES UTILIZED TO PREVENT FALLS:  Life alert? No  Use of a cane, walker or w/c? No  Grab bars in the bathroom? Yes  Shower chair or bench in shower? Yes  Elevated toilet seat or a handicapped toilet? Yes   TIMED UP AND GO:  Was the test performed? No      Cognitive Function:   Montreal Cognitive Assessment  08/19/2019  Visuospatial/ Executive (0/5) 5  Naming (0/3) 3  Attention: Read list of digits (0/2) 2  Attention: Read list of letters (0/1) 1  Attention: Serial 7 subtraction starting at 100 (0/3) 3  Language: Repeat phrase (0/2) 2  Language : Fluency (0/1) 1  Abstraction (0/2) 2  Delayed Recall (0/5) 5  Orientation (0/6) 6  Total 30  Adjusted Score (based on education) 30   6CIT Screen 08/16/2020  What Year? 0 points  What month? 0 points  Count back from 20 0 points  Months in reverse 0 points  Repeat phrase 0 points    Immunizations Immunization History  Administered Date(s) Administered  . Fluad Quad(high Dose 65+) 11/26/2018  . Influenza, High Dose Seasonal PF 03/24/2017  . Influenza,inj,Quad PF,6+ Mos 03/27/2018  . Influenza-Unspecified 12/24/2019  . Moderna Sars-Covid-2 Vaccination 04/20/2019, 05/18/2019  . PFIZER SARS-COV-2 Pediatric Vaccination 5-84yrs 03/12/2020  . Pneumococcal Conjugate-13 07/08/2013, 11/26/2018  . Pneumococcal Polysaccharide-23 10/20/2015  . Td 12/11/2017  . Tdap 10/24/2011    TDAP status: Up to date  Flu Vaccine status: Up to date  Pneumococcal vaccine status: Up to date  Covid-19 vaccine status: Completed vaccines  Qualifies for Shingles Vaccine? Yes   Zostavax completed Yes   Shingrix Completed?: No.  Education has been provided  regarding the importance of this vaccine. Patient has been advised to call insurance company to determine out of pocket expense if they have not yet received this vaccine. Advised may also receive vaccine at local pharmacy or Health Dept. Verbalized acceptance and understanding.  Screening Tests Health Maintenance  Topic Date Due  . Hepatitis C Screening  Never done  . COVID-19 Vaccine (3 - Moderna risk 4-dose series) 04/09/2020  . INFLUENZA VACCINE  11/06/2020  . TETANUS/TDAP  12/12/2027  . PNA vac Low Risk Adult  Completed  . HPV VACCINES  Aged Out    Health Maintenance  Health Maintenance Due  Topic Date Due  . Hepatitis C Screening  Never done  . COVID-19 Vaccine (3 - Moderna risk 4-dose series) 04/09/2020    Colorectal cancer screening: No longer required.    Additional Screening:  Hepatitis C Screening: does qualify;  Vision Screening: Recommended annual ophthalmology exams for early detection of glaucoma and other disorders of the eye. Is the patient up to date with their annual eye exam?  No  Who is the provider or what is the name of the office in which the patient attends [facility] exams? [facility] If pt is not established with a provider, would they like to be referred to a provider to establish care? No .   Dental Screening: Recommended annual dental exams for proper oral hygiene  Community Resource Referral / Chronic Care Management: CRR required this visit?  No   CCM required this visit?  No      Plan:     I have personally reviewed and noted the following in the patient's chart:   . Medical and social history . Use of alcohol, tobacco or illicit drugs  . Current medications and supplements including opioid prescriptions. Patient is not currently taking opioid prescriptions. . Functional ability and status . Nutritional status . Physical activity . Advanced directives . List of other physicians . Hospitalizations, surgeries, and ER visits in previous 12  months . Vitals . Screenings to include cognitive, depression, and falls . Referrals and appointments  In addition, I have reviewed and discussed with patient certain preventive protocols, quality metrics, and best practice recommendations. A written personalized care plan for preventive services as well as general preventive health recommendations were provided to patient.     Willette Brace, LPN   [id number]/57/[id number]   Nurse Notes: Marlynn Perking

## 2020-08-18 ENCOUNTER — Encounter: Payer: Self-pay | Admitting: Neurology

## 2020-08-18 ENCOUNTER — Ambulatory Visit (INDEPENDENT_AMBULATORY_CARE_PROVIDER_SITE_OTHER): Payer: No Typology Code available for payment source | Admitting: Neurology

## 2020-08-18 ENCOUNTER — Other Ambulatory Visit: Payer: Self-pay

## 2020-08-18 VITALS — BP 132/73 | HR 58 | Ht 72.0 in | Wt 197.0 lb

## 2020-08-18 DIAGNOSIS — G5603 Carpal tunnel syndrome, bilateral upper limbs: Secondary | ICD-10-CM

## 2020-08-18 DIAGNOSIS — R202 Paresthesia of skin: Secondary | ICD-10-CM

## 2020-08-18 DIAGNOSIS — G6289 Other specified polyneuropathies: Secondary | ICD-10-CM | POA: Diagnosis not present
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
## 2020-08-22 ENCOUNTER — Other Ambulatory Visit: Payer: Self-pay | Admitting: Adult Health

## 2020-08-22 ENCOUNTER — Telehealth: Payer: Self-pay | Admitting: Neurology

## 2020-08-22 DIAGNOSIS — N281 Cyst of kidney, acquired: Secondary | ICD-10-CM

## 2020-08-22 DIAGNOSIS — K769 Liver disease, unspecified: Secondary | ICD-10-CM

## 2020-08-22 NOTE — Telephone Encounter (Signed)
[facility] [phone]) and spoke with Latrice who told me to fax the referral and demographics, etc. To [phone]). Faxed referral.

## 2020-08-29 ENCOUNTER — Encounter: Payer: Self-pay | Admitting: Adult Health

## 2020-08-29 ENCOUNTER — Telehealth: Payer: Self-pay | Admitting: Adult Health

## 2020-08-29 NOTE — Telephone Encounter (Signed)
Marc Thornton w/[facility] is needing the order changed to reflect if the pt does not have Kidney disease or is on dialysis the order need to be for MRI w/and w/out contrast.

## 2020-08-29 NOTE — Telephone Encounter (Signed)
Whitley w/[facility] would like to have a call back with the changes.

## 2020-08-30 ENCOUNTER — Other Ambulatory Visit: Payer: Self-pay | Admitting: Adult Health

## 2020-08-30 DIAGNOSIS — K769 Liver disease, unspecified: Secondary | ICD-10-CM

## 2020-08-30 DIAGNOSIS — N281 Cyst of kidney, acquired: Secondary | ICD-10-CM

## 2020-08-30 NOTE — Telephone Encounter (Signed)
Please advise I do not see Kidney issues in chart

## 2020-08-31 NOTE — Telephone Encounter (Signed)
Spoke to [place] and she advised that the MRI order that was placed 08/30/2020 was correct. She stated she will call the pt to get them scheduled. No further action needed.

## 2020-09-05 DIAGNOSIS — I422 Other hypertrophic cardiomyopathy: Secondary | ICD-10-CM

## 2020-09-05 DIAGNOSIS — Z01812 Encounter for preprocedural laboratory examination: Secondary | ICD-10-CM

## 2020-09-07 ENCOUNTER — Telehealth: Payer: Self-pay | Admitting: Cardiovascular Disease

## 2020-09-07 NOTE — Telephone Encounter (Signed)
Spoke with patient regarding scheduling preferences for the Cardiac MRI ordered by Dr. Sallyanne Kuster.  Informed patient as soon as we hear regarding the insurance prior authorization I will be in touch with the appointment information.  He voiced his understanding.

## 2020-09-07 NOTE — Telephone Encounter (Signed)
    Pt calling to f/u about his mychart questions. He asked if someone could give him a callback today

## 2020-09-07 NOTE — Telephone Encounter (Signed)
This RN called patient back, patient was following up with his 5/31 MyChart messages. The patient wanted to know if an abdominal MRI ordered by his primary care provider could be taken on the same day/encounter as his cardiac MRI. This RN relayed the following message from Dr. Sallyanne Kuster:  Croitoru, Dani Gobble, MD to Chriss Driver, RN    8:52 AM The only place that we can do a cardiac MRI is at [facility].  He needs to ask whoever ordered the abdominal MRI to switch it to [facility], if that is possible  The patient reported he was aware the cardiac MRI needed to be done at [facility] but was asking what the MRI capabilities of [facility] MRI center are. This RN told patient that she did not know the specifics of the MRI's capabilities but that it might be helpful to contact his primary care office to see if taking an abdominal MRI in the same encounter would be appropriate for their testing needs. Patient verbalized understanding.   Patient also asked about his MyChart questions from 5/27. This RN reviewed and let patient know his question had been forwarded to Dr. Sallyanne Kuster, but that Dr. Sallyanne Kuster had not had a chance to respond yet, patient verbalized understanding.   Patient expressed frustration with attempting to communicate via MyChart, this RN asked patient if he would like to make an appointment so he can have time to discuss his concerns with his provider. Patient agreeable to appointment, this RN made patient an appointment with Dr. Sallyanne Kuster on September 20 at 4:00pm.

## 2020-09-07 NOTE — Telephone Encounter (Signed)
Please see telephone encounter

## 2020-10-18 ENCOUNTER — Encounter: Payer: Self-pay | Admitting: Adult Health

## 2020-10-18 NOTE — Telephone Encounter (Signed)
Please advise 

## 2020-10-19 ENCOUNTER — Other Ambulatory Visit: Payer: Medicare PPO

## 2020-10-19 ENCOUNTER — Encounter: Payer: Self-pay | Admitting: Adult Health

## 2020-10-19 ENCOUNTER — Other Ambulatory Visit: Payer: Self-pay

## 2020-10-19 DIAGNOSIS — Z01812 Encounter for preprocedural laboratory examination: Secondary | ICD-10-CM

## 2020-10-19 DIAGNOSIS — I422 Other hypertrophic cardiomyopathy: Secondary | ICD-10-CM

## 2020-10-19 LAB — CBC
HCT: 42.9 % (ref 39.0–52.0)
Hemoglobin: 14.7 g/dL (ref 13.0–17.0)
MCHC: 34.2 g/dL (ref 30.0–36.0)
MCV: 94.7 fl (ref 78.0–100.0)
Platelets: 130 10*3/uL — ABNORMAL LOW (ref 150.0–400.0)
RBC: 4.53 Mil/uL (ref 4.22–5.81)
RDW: 13.1 % (ref 11.5–15.5)
WBC: 5.4 10*3/uL (ref 4.0–10.5)

## 2020-10-19 LAB — BASIC METABOLIC PANEL
BUN: 28 mg/dL — ABNORMAL HIGH (ref 6–23)
CO2: 27 mEq/L (ref 19–32)
Calcium: 9.3 mg/dL (ref 8.4–10.5)
Chloride: 104 mEq/L (ref 96–112)
Creatinine, Ser: 0.95 mg/dL (ref 0.40–1.50)
GFR: 77.75 mL/min (ref >60.00)
Glucose, Bld: 80 mg/dL (ref 70–99)
Potassium: 4 mEq/L (ref 3.5–5.1)
Sodium: 139 mEq/L (ref 135–145)

## 2020-10-19 NOTE — Addendum Note (Signed)
Addended by: Elmer Picker on: 10/19/2020 11:18 AM   Modules accepted: Orders

## 2020-10-20 DIAGNOSIS — Z818 Family history of other mental and behavioral disorders: Secondary | ICD-10-CM | POA: Diagnosis not present

## 2020-10-20 DIAGNOSIS — I679 Cerebrovascular disease, unspecified: Secondary | ICD-10-CM | POA: Diagnosis not present

## 2020-10-20 DIAGNOSIS — G629 Polyneuropathy, unspecified: Secondary | ICD-10-CM | POA: Diagnosis not present

## 2020-10-20 DIAGNOSIS — Z87891 Personal history of nicotine dependence: Secondary | ICD-10-CM | POA: Diagnosis not present

## 2020-10-20 NOTE — Telephone Encounter (Signed)
FYI

## 2020-10-23 ENCOUNTER — Telehealth (HOSPITAL_COMMUNITY): Payer: Self-pay | Admitting: Emergency Medicine

## 2020-10-23 NOTE — Telephone Encounter (Signed)
Reaching out to patient to offer assistance regarding upcoming cardiac imaging study; pt verbalizes understanding of appt date/time, parking situation and where to check in, and verified current allergies; name and call back number provided for further questions should they arise Marchia Bond RN [facility] [phone] office [phone] cell   Denies iv access issues Denies claustro Denies metal implants

## 2020-10-24 ENCOUNTER — Other Ambulatory Visit: Payer: Self-pay

## 2020-10-24 ENCOUNTER — Ambulatory Visit (HOSPITAL_COMMUNITY)
Admission: RE | Admit: 2020-10-24 | Discharge: 2020-10-24 | Disposition: A | Discharge status: Home / Self Care | Payer: Medicare PPO | Source: Ambulatory Visit | Attending: Cardiovascular Disease | Admitting: Cardiovascular Disease

## 2020-10-24 DIAGNOSIS — Z01812 Encounter for preprocedural laboratory examination: Secondary | ICD-10-CM | POA: Diagnosis not present

## 2020-10-24 DIAGNOSIS — I422 Other hypertrophic cardiomyopathy: Secondary | ICD-10-CM

## 2020-10-24 MED ORDER — GADOBUTROL 1 MMOL/ML IV SOLN
10.0000 mL | Freq: Once | INTRAVENOUS | Status: AC | PRN
  Administered 2020-10-24: 10 mL via INTRAVENOUS

## 2020-11-07 DIAGNOSIS — Z87891 Personal history of nicotine dependence: Secondary | ICD-10-CM | POA: Diagnosis not present

## 2020-11-07 DIAGNOSIS — I771 Stricture of artery: Secondary | ICD-10-CM | POA: Diagnosis not present

## 2020-12-07 ENCOUNTER — Encounter: Payer: Self-pay | Admitting: Adult Health

## 2020-12-08 NOTE — Telephone Encounter (Signed)
Please advise 

## 2020-12-12 ENCOUNTER — Encounter: Payer: Self-pay | Admitting: Adult Health

## 2020-12-14 ENCOUNTER — Ambulatory Visit
Admission: RE | Admit: 2020-12-14 | Discharge: 2020-12-14 | Disposition: A | Discharge status: Home / Self Care | Payer: Medicare PPO | Source: Ambulatory Visit | Attending: Adult Health | Admitting: Adult Health

## 2020-12-14 ENCOUNTER — Other Ambulatory Visit: Payer: Self-pay

## 2020-12-14 DIAGNOSIS — N281 Cyst of kidney, acquired: Secondary | ICD-10-CM | POA: Diagnosis not present

## 2020-12-14 DIAGNOSIS — K7689 Other specified diseases of liver: Secondary | ICD-10-CM | POA: Diagnosis not present

## 2020-12-14 DIAGNOSIS — K769 Liver disease, unspecified: Secondary | ICD-10-CM

## 2020-12-14 MED ORDER — GADOBUTROL 1 MMOL/ML IV SOLN
10.0000 mL | Freq: Once | INTRAVENOUS | Status: AC | PRN
  Administered 2020-12-14: 8 mL via INTRAVENOUS

## 2020-12-16 ENCOUNTER — Encounter: Payer: Self-pay | Admitting: Adult Health

## 2020-12-18 NOTE — Telephone Encounter (Signed)
FYI

## 2020-12-22 ENCOUNTER — Other Ambulatory Visit: Payer: Medicare PPO

## 2020-12-26 ENCOUNTER — Ambulatory Visit: Payer: Medicare PPO | Admitting: Cardiovascular Disease

## 2020-12-26 ENCOUNTER — Other Ambulatory Visit: Payer: Self-pay

## 2020-12-26 ENCOUNTER — Encounter: Payer: Self-pay | Admitting: Cardiovascular Disease

## 2020-12-26 VITALS — BP 128/70 | HR 60 | Ht 72.0 in | Wt 194.2 lb

## 2020-12-26 DIAGNOSIS — E785 Hyperlipidemia, unspecified: Secondary | ICD-10-CM | POA: Diagnosis not present

## 2020-12-26 DIAGNOSIS — I422 Other hypertrophic cardiomyopathy: Secondary | ICD-10-CM

## 2020-12-26 DIAGNOSIS — I5032 Chronic diastolic (congestive) heart failure: Secondary | ICD-10-CM

## 2020-12-26 NOTE — Patient Instructions (Signed)

## 2020-12-26 NOTE — Progress Notes (Signed)
Cardiology Office Note    Date:  12/27/2020   ID:  Gayland, Nicol 04-03-1946, MRN [id number]  PCP:  [name], NP  Cardiologist:   Sanda Klein, MD   Chief complaint: Follow-up cardiomyopathy   History of Present Illness:  Marc Thornton is a 75 y.o. male with apical hypertrophic cardiomyopathy, asymptomatic.  The patient specifically denies any chest pain at rest exertion, dyspnea at rest or with exertion, orthopnea, paroxysmal nocturnal dyspnea, syncope, palpitations, focal neurological deficits, intermittent claudication, lower extremity edema, unexplained weight gain, cough, hemoptysis or wheezing.  Can do his own yard work without complaints.  His cardiac MRI confirmed the suspicion of apical variant hypertrophic cardiomyopathy raised by his ECG and echocardiogram.  There is no left ventricular outflow tract obstruction, but there is cavity obliteration at the apex.  Mild/moderate late gadolinium enhancement at 14% was recorded, in the subendocardial area of the apex.  He has apical displacement of the papillary muscles.  He does not have a personal history of arrhythmia or syncope or family history of sudden cardiac death or ventricular tachycardia.  He remains preoccupied about the possibility of "small vessel disease" contributing to the scar in his myocardium, as well as to white matter changes on MRI of his brain.  He has a family history of neurological decline at advanced ages (65s) which has led to the demise of his father and paternal uncles with a fairly predictable pattern of dementia culminating in a stroke.  He thinks that there is probably a familial pattern of "small vessel disease".  He is concerned that he has noticed subtle reductions in his frontal lobe functions,  such as complex task strategizing and difficulty finding words.  His MRI does confirm some white matter abnormalities on T2 flair imaging suggestive of microvascular ischemia.  He has been seeing Dr.  Krista Blue in the neurology clinic.  Although it was not ordered, on my review of his echocardiogram from September 2020 there is evidence of apical variant hypertrophic cardiomyopathy.  There was no evidence of left upper tract obstruction though he does have an inducible systolic murmur on physical exam.  His event monitor in 2020 shows frequent PVCs and occasional brief paroxysmal atrial tachycardia but no evidence of VT or atrial fibrillation or any significant bradycardia.  A cardiac PYP scan did not show any evidence of amyloidosis. Cardiac MRI July 2022 shows findings suggestive of apical variant hypertrophic cardiomyopathy. His EKG shows very striking changes of left ventricular hypertrophy with prominent repolarization abnormalities.  The nuclear perfusion images were normal. He was only able to exercise for 4-1/2 minutes (5.6 METS) before he reached a heart rate of 149 bpm. He had a hypertensive response to exercise with a diastolic blood pressure that increased to 107 mmHg. Exercise performance was good. There was evidence of diastolic dysfunction. The mitral annulus diastolic velocities were markedly reduced and the E/e' ratio was borderline significant for elevated filling pressures.   He spent a year and a half in [place] in the [employer] and had some exposure to [employer]. He quit smoking 50 years ago.   There is a strong family history of cerebrovascular disease and dementia on his father's side. His paternal grandfather died at age 80 of a stroke. His father had triple bypass at age 79 and lived to be 69, after having a stroke at age 62. His mother died at age 64 from metastatic breast cancer. No family history of hypertrophic cardiomyopathy, congestive heart failure, unexplained sudden  cardiac death or her ventricular arrhythmia. He believes his father had amyloid angiopathy and wonders whether this might be hereditary.   Past Medical History:  Diagnosis Date   Abnormality of gait    Arthritis     Atrial fibrillation ([place])    BPH (benign prostatic hyperplasia)    Cardiomyopathy (HCC)    Cervical disc disorder    Chicken pox    Closed fracture of one or more phalanges of foot    Clotting disorder (HCC)    low platelets   Combined systolic and diastolic cardiac dysfunction    DDD (degenerative disc disease), cervical    Depression    Elevated PSA    Fracture of foot bone, right, closed 1964   GERD (gastroesophageal reflux disease)    Hammer toe    Hayfever    Hearing loss    Heart murmur    Hyperlipidemia    Neoplasm of uncertain behavior of skin    Pilonidal cyst with abscess    Polyneuropathy    Rheumatic fever    Seasonal allergies    Talipes cavus    Tinnitus     Past Surgical History:  Procedure Laterality Date   COLONOSCOPY     2006 normal exam   INGUINAL HERNIA REPAIR     PILONIDAL CYST EXCISION  1966   WISDOM TOOTH EXTRACTION      Current Medications: Outpatient Medications Prior to Visit  Medication Sig Dispense Refill   acetaminophen (TYLENOL) 325 MG tablet Take by mouth as needed.     Betaine, Trimethylglycine, (TMG, TRIMETHYLGLYCINE, PO) Take by mouth. Taking 2 capsules     cholecalciferol (VITAMIN D3) 25 MCG (1000 UNIT) tablet Take 1,000 Units by mouth daily.     ketoconazole (NIZORAL) 2 % shampoo Apply 1 application topically as directed. PRN     Naproxen Sodium (ALEVE PO) Take by mouth as needed.     triamcinolone cream (KENALOG) 0.1 % Apply 1 application topically daily as needed.     ibuprofen (ADVIL) 200 MG tablet Take 200 mg by mouth as needed. (Patient not taking: Reported on 12/26/2020)     No facility-administered medications prior to visit.     Allergies:   Patient has no known allergies.   Social History   Socioeconomic History   Marital status: Widowed    Spouse name: Not on file   Number of children: Not on file   Years of education: Not on file   Highest education level: Not on file  Occupational History   Occupation:  Retired  Tobacco Use   Smoking status: Former    Packs/day: 2.00    Years: 9.00    Pack years: 18.00    Types: Cigarettes    Start date: 6    Quit date: 1969    Years since quitting: 53.7   Smokeless tobacco: Former  Scientific laboratory technician Use: Never used  Substance and Sexual Activity   Alcohol use: Yes    Comment: on occasion, maybe 3-4 times per month   Drug use: No   Sexual activity: Not on file  Other Topics Concern   Not on file  Social History Narrative   Lives alone   Right handed   Caffeine: 1-2 cups tea/day and 1 cup coffee some  days   Social Determinants of Radio broadcast assistant Strain: Low Risk    Difficulty of Paying Living Expenses: Not hard at all  Food Insecurity: No Food Insecurity   Worried  About Running Out of Food in the Last Year: Never true   Ran Out of Food in the Last Year: Never true  Transportation Needs: No Transportation Needs   Lack of Transportation (Medical): No   Lack of Transportation (Non-Medical): No  Physical Activity: Insufficiently Active   Days of Exercise per Week: 4 days   Minutes of Exercise per Session: 30 min  Stress: No Stress Concern Present   Feeling of Stress : Not at all  Social Connections: Socially Isolated   Frequency of Communication with Friends and Family: More than three times a week   Frequency of Social Gatherings with Friends and Family: Once a week   Attends Religious Services: Never   Marine scientist or Organizations: No   Attends Archivist Meetings: Never   Marital Status: Widowed     Family History:  The patient's family history includes Arthritis in his father and mother; Breast cancer in his mother; Dementia in his father, paternal aunt, and paternal uncle; Heart disease in his father; Hypertension in his father; Prostate cancer in his father; Stroke in his father, maternal grandmother, and paternal grandfather.   ROS:   Please see the history of present illness.    ROS All  other systems reviewed and are negative.   PHYSICAL EXAM:   VS:  BP 128/70 (BP Location: Left Arm, Patient Position: Sitting, Cuff Size: Normal)   Pulse 60   Ht 6' (1.829 m)   Wt 194 lb 3.2 oz (88.1 kg)   SpO2 94%   BMI 26.34 kg/m       General: Alert, oriented x3, no distress, minimally overweight.  Appears fit Head: no evidence of trauma, PERRL, EOMI, no exophtalmos or lid lag, no myxedema, no xanthelasma; normal ears, nose and oropharynx Neck: normal jugular venous pulsations and no hepatojugular reflux; brisk carotid pulses without delay and no carotid bruits Chest: clear to auscultation, no signs of consolidation by percussion or palpation, normal fremitus, symmetrical and full respiratory excursions Cardiovascular: normal position and quality of the apical impulse, regular rhythm, normal first and second heart sounds, no diastolic murmurs, rubs or gallops.  There is a grade 1 midsystolic murmur heard over the mid precordium that increases slightly with a Valsalva maneuver. Abdomen: no tenderness or distention, no masses by palpation, no abnormal pulsatility or arterial bruits, normal bowel sounds, no hepatosplenomegaly Extremities: no clubbing, cyanosis or edema; 2+ radial, ulnar and brachial pulses bilaterally; 2+ right femoral, posterior tibial and dorsalis pedis pulses; 2+ left femoral, posterior tibial and dorsalis pedis pulses; no subclavian or femoral bruits Neurological: grossly nonfocal Psych: Normal mood and affect    Wt Readings from Last 3 Encounters:  12/26/20 194 lb 3.2 oz (88.1 kg)  08/18/20 197 lb (89.4 kg)  06/27/20 194 lb 9.6 oz (88.3 kg)      Studies/Labs Reviewed:   cMRI 10/24/2020: 1. Findings consistent with apical variant hypertrophic cardiomyopathy. ECG with marked T wave abnormalities, disproportionate apical thickening 15 mm with displaced hypertrophied papillary muscles, Systolic cavity obliteration, and hyper enhancement post gadolinium  involving 14.4% of the total myocardium   2.  Normal LV function quantitative EF 69%   3.  Normal RV size and function   4.  Normal cardiac valves   5.  Normal aortic root 3.7 cm  ECHO 12/10/2018:  1. The left ventricle has hyperdynamic systolic function, with an ejection fraction of >65%. The cavity size was normal. Left ventricular diastolic Doppler parameters are consistent with impaired relaxation.  Indeterminate filling pressures The E/e' is  8-15. No evidence of left ventricular regional wall motion abnormalities.  2. The right ventricle has normal systolic function. The cavity was normal. There is no increase in right ventricular wall thickness.  3. The mitral valve is grossly normal.  4. The aortic valve was not well visualized. No stenosis of the aortic valve.  5. The aorta is normal unless otherwise noted.  6. The interatrial septum was not well visualized.  On my review of the above echo images, I think he probably has apical variant hypertrophic cardiomyopathy.  There was no evidence of systolic anterior motion of the mitral valve or any left ventricular outflow tract obstruction at rest, but provocative maneuvers were not performed.  Event monitor 01/28/2019: Abnormal event monitor due to the presence of frequent PVCs and occasional brief runs of nonsustained atrial tachycardia.   There is no evidence of significant bradycardia, ventricular tachycardia or atrial fibrillation.  EKG: EKG is ordered today and is very similar to previous tracings. It shows normal sinus rhythm, left atrial abnormality, prominent left ventricular hypertrophy with deep T wave inversion especially seen in leads V2-V5.  Normal QTC 412 ms.  There is a sudden R wave transition in lead V2 and there is a isolated deep sharp Q waves in lead aVL. LABS: [facility] 10/20/2015 Cholesterol 178, triglycerides 303, HDL 43, LDL 74, hemoglobin A1c 5.4%  07/07/2019 Total cholesterol 167, HDL 38, LDL 94,  triglycerides 175 Hemoglobin A1c 5.5% Hemoglobin 15.4, creatinine 0.95, potassium 3.9, TSH 2.87  03/01/2020 Cholesterol 172, HDL 41, LDL 108, triglycerides 120 Hemoglobin A1c 5.3% Hemoglobin 14.7, creatinine 0.95, potassium 4.0, TSH 2.51, ALT 17  ASSESSMENT:    1. 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 Apical variant hypertrophic cardiomyopathy ([place])   3. Dyslipidemia      PLAN:  In order of problems listed above:   CHF: Had some limitation in functional ability on formal stress testing, although he does not have any symptoms during activities of daily living, NYHA functional class I-2.  Has never required diuretics.  There is echo evidence of reduced diastolic relaxation properties of the left ventricle.  No pharmacological treatment necessary at this time. Apical variant hypertrophic cardiomyopathy: Typical findings on ECG, strong support for this diagnosis on close review of his echo, diagnosis confirmed by MRI.  He has no history of complex ventricular arrhythmia, history of syncope or family history of sudden death or arrhythmia.  Relatively small amount of scar seen on gadolinium enhanced MRI.  Appears to have a very benign variation of this genetic disorder.  He does have an outflow murmur that increases with Valsalva maneuver, but he has no symptoms of LVOT obstruction and none was detected on echocardiography.  LVOT diameter does not appear to be encroached on MRI.  No specific treatment recommended at this time.  He should promptly report sustained palpitation, syncope/near syncope or worsening exertional dyspnea. HyperTG: Improved with changes in his diet.  Most recent lipid profile showed all parameters in desirable range, but still a relatively low HDL. Cerebral small vessel disease: I think he makes a good point of it may be a familial pattern of vascular dementia and stroke related to small vessel disease.  His lipid profile was  favorable.  His blood pressure is normal.  Discuss further management with his neurologist.  Discussed the benefit of regular, moderate physical exercise in prevention of dementia.  Medication Adjustments/Labs and Tests Ordered: Current medicines are  reviewed at length with the patient today.  Concerns regarding medicines are outlined above.  Medication changes, Labs and Tests ordered today are listed in the Patient Instructions below. Patient Instructions  Medication Instructions:  No changes *If you need a refill on your cardiac medications before your next appointment, please call your pharmacy*   Lab Work: None ordered If you have labs (blood work) drawn today and your tests are completely normal, you will receive your results only by: [place] (if you have MyChart) OR A paper copy in the mail If you have any lab test that is abnormal or we need to change your treatment, we will call you to review the results.   Testing/Procedures: None ordered   Follow-Up: At [facility], you and your health needs are our priority.  As part of our continuing mission to provide you with exceptional heart care, we have created designated Provider Care Teams.  These Care Teams include your primary Cardiologist (physician) and Advanced Practice Providers (APPs -  Physician Assistants and Nurse Practitioners) who all work together to provide you with the care you need, when you need it.  We recommend signing up for the patient portal called "MyChart".  Sign up information is provided on this After Visit Summary.  MyChart is used to connect with patients for Virtual Visits (Telemedicine).  Patients are able to view lab/test results, encounter notes, upcoming appointments, etc.  Non-urgent messages can be sent to your provider as well.   To learn more about what you can do with MyChart, go to [email or web address].    Your next appointment:   12 month(s)  The format for your next appointment:    In Person  Provider:   You may see Sanda Klein, MD or one of the following Advanced Practice Providers on your designated Care Team:   Almyra Deforest, PA-C Fabian Sharp, [place] or  Roby Lofts, PA-C   Signed, Sanda Klein, MD  12/27/2020 9:00 AM    [address], [address], [place]  [address] Phone: [phone]; Fax: [phone]

## 2021-02-13 DIAGNOSIS — R4181 Age-related cognitive decline: Secondary | ICD-10-CM | POA: Diagnosis not present

## 2021-02-13 DIAGNOSIS — Z818 Family history of other mental and behavioral disorders: Secondary | ICD-10-CM | POA: Diagnosis not present

## 2021-02-13 DIAGNOSIS — G4701 Insomnia due to medical condition: Secondary | ICD-10-CM | POA: Diagnosis not present

## 2021-02-13 DIAGNOSIS — R464 Slowness and poor responsiveness: Secondary | ICD-10-CM | POA: Diagnosis not present

## 2021-02-13 DIAGNOSIS — I6789 Other cerebrovascular disease: Secondary | ICD-10-CM | POA: Diagnosis not present

## 2021-02-13 DIAGNOSIS — G609 Hereditary and idiopathic neuropathy, unspecified: Secondary | ICD-10-CM | POA: Diagnosis not present

## 2021-02-13 DIAGNOSIS — R400 Somnolence: Secondary | ICD-10-CM | POA: Diagnosis not present

## 2021-02-13 DIAGNOSIS — G629 Polyneuropathy, unspecified: Secondary | ICD-10-CM | POA: Diagnosis not present

## 2021-02-13 DIAGNOSIS — Z87891 Personal history of nicotine dependence: Secondary | ICD-10-CM | POA: Diagnosis not present

## 2021-02-13 DIAGNOSIS — I7090 Unspecified atherosclerosis: Secondary | ICD-10-CM | POA: Diagnosis not present

## 2021-02-13 DIAGNOSIS — R4189 Other symptoms and signs involving cognitive functions and awareness: Secondary | ICD-10-CM | POA: Diagnosis not present

## 2021-02-28 ENCOUNTER — Telehealth: Payer: Self-pay | Admitting: *Deleted

## 2021-02-28 NOTE — Telephone Encounter (Signed)
Note able to reach pt. Mail box not setup. Pt needs to call [facility] imaging for his cd, [phone]

## 2021-03-06 ENCOUNTER — Ambulatory Visit (INDEPENDENT_AMBULATORY_CARE_PROVIDER_SITE_OTHER): Payer: Medicare PPO | Admitting: Adult Health

## 2021-03-06 ENCOUNTER — Encounter: Payer: Self-pay | Admitting: Adult Health

## 2021-03-06 VITALS — BP 120/80 | HR 59 | Temp 97.5°F | Ht 71.25 in | Wt 198.0 lb

## 2021-03-06 DIAGNOSIS — N401 Enlarged prostate with lower urinary tract symptoms: Secondary | ICD-10-CM

## 2021-03-06 DIAGNOSIS — R9082 White matter disease, unspecified: Secondary | ICD-10-CM

## 2021-03-06 DIAGNOSIS — Z0000 Encounter for general adult medical examination without abnormal findings: Secondary | ICD-10-CM | POA: Diagnosis not present

## 2021-03-06 DIAGNOSIS — Z1159 Encounter for screening for other viral diseases: Secondary | ICD-10-CM

## 2021-03-06 DIAGNOSIS — E042 Nontoxic multinodular goiter: Secondary | ICD-10-CM

## 2021-03-06 DIAGNOSIS — R3914 Feeling of incomplete bladder emptying: Secondary | ICD-10-CM | POA: Diagnosis not present

## 2021-03-06 DIAGNOSIS — I5032 Chronic diastolic (congestive) heart failure: Secondary | ICD-10-CM

## 2021-03-06 DIAGNOSIS — E781 Pure hyperglyceridemia: Secondary | ICD-10-CM | POA: Diagnosis not present

## 2021-03-06 DIAGNOSIS — Z23 Encounter for immunization: Secondary | ICD-10-CM | POA: Diagnosis not present

## 2021-03-06 LAB — LIPID PANEL
Cholesterol: 173 mg/dL (ref 0–200)
HDL: 46.6 mg/dL (ref >39.00)
LDL Cholesterol: 103 mg/dL — ABNORMAL HIGH (ref 0–99)
NonHDL: 126.77
Total CHOL/HDL Ratio: 4
Triglycerides: 120 mg/dL (ref 0.0–149.0)
VLDL: 24 mg/dL (ref 0.0–40.0)

## 2021-03-06 LAB — COMPREHENSIVE METABOLIC PANEL
ALT: 22 U/L (ref 0–53)
AST: 31 U/L (ref 0–37)
Albumin: 4.6 g/dL (ref 3.5–5.2)
Alkaline Phosphatase: 56 U/L (ref 39–117)
BUN: 27 mg/dL — ABNORMAL HIGH (ref 6–23)
CO2: 30 mEq/L (ref 19–32)
Calcium: 10.2 mg/dL (ref 8.4–10.5)
Chloride: 101 mEq/L (ref 96–112)
Creatinine, Ser: 1.08 mg/dL (ref 0.40–1.50)
GFR: 66.48 mL/min (ref >60.00)
Glucose, Bld: 84 mg/dL (ref 70–99)
Potassium: 4.5 mEq/L (ref 3.5–5.1)
Sodium: 139 mEq/L (ref 135–145)
Total Bilirubin: 0.9 mg/dL (ref 0.2–1.2)
Total Protein: 7.7 g/dL (ref 6.0–8.3)

## 2021-03-06 LAB — CBC WITH DIFFERENTIAL/PLATELET
Basophils Absolute: 0 10*3/uL (ref 0.0–0.1)
Basophils Relative: 0.6 % (ref 0.0–3.0)
Eosinophils Absolute: 0.2 10*3/uL (ref 0.0–0.7)
Eosinophils Relative: 3.8 % (ref 0.0–5.0)
HCT: 46.5 % (ref 39.0–52.0)
Hemoglobin: 15.7 g/dL (ref 13.0–17.0)
Lymphocytes Relative: 42.2 % (ref 12.0–46.0)
Lymphs Abs: 2.7 10*3/uL (ref 0.7–4.0)
MCHC: 33.7 g/dL (ref 30.0–36.0)
MCV: 93.7 fl (ref 78.0–100.0)
Monocytes Absolute: 0.5 10*3/uL (ref 0.1–1.0)
Monocytes Relative: 8.1 % (ref 3.0–12.0)
Neutro Abs: 2.9 10*3/uL (ref 1.4–7.7)
Neutrophils Relative %: 45.3 % (ref 43.0–77.0)
Platelets: 159 10*3/uL (ref 150.0–400.0)
RBC: 4.96 Mil/uL (ref 4.22–5.81)
RDW: 13.1 % (ref 11.5–15.5)
WBC: 6.5 10*3/uL (ref 4.0–10.5)

## 2021-03-06 LAB — POCT URINALYSIS DIPSTICK
Bilirubin, UA: NEGATIVE
Blood, UA: NEGATIVE
Glucose, UA: NEGATIVE
Ketones, UA: NEGATIVE
Leukocytes, UA: NEGATIVE
Nitrite, UA: NEGATIVE
Protein, UA: NEGATIVE
Spec Grav, UA: 1.02 (ref 1.010–1.025)
Urobilinogen, UA: 0.2 E.U./dL
pH, UA: 6 (ref 5.0–8.0)

## 2021-03-06 LAB — TSH: TSH: 2.11 u[IU]/mL (ref 0.35–5.50)

## 2021-03-06 LAB — HEMOGLOBIN A1C: Hgb A1c MFr Bld: 5.4 % (ref 4.6–6.5)

## 2021-03-06 LAB — PSA: PSA: 4.3 ng/mL — ABNORMAL HIGH (ref 0.10–4.00)

## 2021-03-06 NOTE — Patient Instructions (Addendum)
It was great seeing you today   We will follow up with you regarding your lab work   I will order a thyroid ultra sound and they will call you to schedule your appointment   Please let me know if you need anything

## 2021-03-06 NOTE — Progress Notes (Signed)
Subjective:    Patient ID: Marc Thornton, male    DOB: [birth year], 75 y.o.   MRN: [id number]  HPI Patient presents for yearly preventative medicine examination. He is a pleasant 75 year old male who  has a past medical history of Abnormality of gait, Arthritis, Atrial fibrillation ([place]), BPH (benign prostatic hyperplasia), Cardiomyopathy ([place]), Cervical disc disorder, Chicken pox, Closed fracture of one or more phalanges of foot, Clotting disorder ([place]), Combined systolic and diastolic cardiac dysfunction, DDD (degenerative disc disease), cervical, Depression, Elevated PSA, Fracture of foot bone, right, closed (1964), GERD (gastroesophageal reflux disease), Hammer toe, Hayfever, Hearing loss, Heart murmur, Hyperlipidemia, Neoplasm of uncertain behavior of skin, Pilonidal cyst with abscess, Polyneuropathy, Rheumatic fever, Seasonal allergies, Talipes cavus, and Tinnitus.  CHF -managed by cardiology.  He has had some limitation of functional ability on formal stress testing, although he does not have any symptoms during activities of daily living.  He does not require diuretics.  Evidence on echo of reduced diastolic relaxation properties of the left ventricle.  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  Confirmed by MRI or typical findings on ECG.  Has no history of complex ventricular arrhythmia, history of syncope, or family history of sudden death or arrhythmia.  Does have an outflow murmur that increases with Valsalva maneuver, but no symptoms of LVOT obstruction and none was detected on echo.   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.  As white matter hyperintensities on MRI, mild  cognitive decline with family history of vascular dementia.  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  BPH - symptoms include that of nocturia ( 3 times a night) decreased stream, incompletely bladder emptying.   H/o Thyroid Nodules -evaluated in 2017 [facility].  Ultrasound at this time showed a 6 mm simple cyst on the right lobe and a 4 mm simple cyst on the left lobe.  He reports that he has noticed that there is a sensation of fullness in his neck and he believes the thyroid cyst may be getting larger.  Does not get a choking sensation with swallowing foods or drink.  All immunizations and health maintenance protocols were reviewed with the patient and needed orders were placed.  Appropriate screening laboratory values were ordered for the patient including screening of hyperlipidemia, renal function and hepatic function. If indicated by BPH, a PSA was ordered.  Medication reconciliation,  past medical history, social history, problem list and allergies were reviewed in detail with the patient  Goals were established with regard to weight loss, exercise, and  diet in compliance with medications. He was walking on a routine basis until his dog passed away, he plans to get back to walking on a routine basis.   Wt Readings from Last 3 Encounters:  03/06/21 198 lb (89.8 kg)  12/26/20 194 lb 3.2 oz (88.1 kg)  08/18/20 197 lb (89.4 kg)   Review of Systems  Constitutional: Negative.   HENT: Negative.    Eyes: Negative.   Respiratory: Negative.    Cardiovascular: Negative.   Gastrointestinal: Negative.   Endocrine: Negative.   Genitourinary: Negative.   Musculoskeletal: Negative.   Skin: Negative.  Allergic/Immunologic: Negative.   Neurological: Negative.   Hematological: Negative.   Psychiatric/Behavioral: Negative.    All other systems reviewed and are negative.  Past Medical History:  Diagnosis Date   Abnormality of gait    Arthritis     Atrial fibrillation (HCC)    BPH (benign prostatic hyperplasia)    Cardiomyopathy (HCC)    Cervical disc disorder    Chicken pox    Closed fracture of one or more phalanges of foot    Clotting disorder (HCC)    low platelets   Combined systolic and diastolic cardiac dysfunction    DDD (degenerative disc disease), cervical    Depression    Elevated PSA    Fracture of foot bone, right, closed 1964   GERD (gastroesophageal reflux disease)    Hammer toe    Hayfever    Hearing loss    Heart murmur    Hyperlipidemia    Neoplasm of uncertain behavior of skin    Pilonidal cyst with abscess    Polyneuropathy    Rheumatic fever    Seasonal allergies    Talipes cavus    Tinnitus     Social History   Socioeconomic History   Marital status: Widowed    Spouse name: Not on file   Number of children: Not on file   Years of education: Not on file   Highest education level: Not on file  Occupational History   Occupation: Retired  Tobacco Use   Smoking status: Former    Packs/day: 2.00    Years: 9.00    Pack years: 18.00    Types: Cigarettes    Start date: 62    Quit date: 1969    Years since quitting: 53.9   Smokeless tobacco: Former  Scientific laboratory technician Use: Never used  Substance and Sexual Activity   Alcohol use: Yes    Comment: on occasion, maybe 3-4 times per month   Drug use: No   Sexual activity: Not on file  Other Topics Concern   Not on file  Social History Narrative   Lives alone   Right handed   Caffeine: 1-2 cups tea/day and 1 cup coffee some  days   Social Determinants of Radio broadcast assistant Strain: Low Risk    Difficulty of Paying Living Expenses: Not hard at all  Food Insecurity: No Food Insecurity   Worried About Charity fundraiser in the Last Year: Never true   [place] in the Last Year: Never true  Transportation Needs: No Transportation Needs   Lack of Transportation (Medical): No   Lack of Transportation (Non-Medical): No   Physical Activity: Insufficiently Active   Days of Exercise per Week: 4 days   Minutes of Exercise per Session: 30 min  Stress: No Stress Concern Present   Feeling of Stress : Not at all  Social Connections: Socially Isolated   Frequency of Communication with Friends and Family: More than three times a week   Frequency of Social Gatherings with Friends and Family: Once a week   Attends Religious Services: Never   Marine scientist or Organizations: No   Attends Archivist Meetings: Never   Marital Status: Widowed  Human resources officer Violence: Not At Risk   Fear of Current or Ex-Partner: No   Emotionally Abused: No   Physically Abused: No   Sexually Abused: No    Past Surgical History:  Procedure Laterality Date   COLONOSCOPY  2006 normal exam   INGUINAL HERNIA REPAIR     PILONIDAL CYST EXCISION  1966   WISDOM TOOTH EXTRACTION      Family History  Problem Relation Age of Onset   Arthritis Mother    Breast cancer Mother    Arthritis Father    Heart disease Father    Stroke Father    Hypertension Father    Prostate cancer Father    Dementia Father        vascular    Stroke Paternal Grandfather    Stroke Maternal Grandmother    Dementia Paternal Aunt        vascular   Dementia Paternal Uncle        vascular   Colon cancer Neg Hx    Esophageal cancer Neg Hx    Stomach cancer Neg Hx    Rectal cancer Neg Hx     No Known Allergies  Current Outpatient Medications on File Prior to Visit  Medication Sig Dispense Refill   acetaminophen (TYLENOL) 325 MG tablet Take by mouth as needed.     Betaine, Trimethylglycine, (TMG, TRIMETHYLGLYCINE, PO) Take by mouth. Taking 2 capsules     cholecalciferol (VITAMIN D3) 25 MCG (1000 UNIT) tablet Take 1,000 Units by mouth daily.     ibuprofen (ADVIL) 200 MG tablet Take 200 mg by mouth as needed. (Patient not taking: Reported on 12/26/2020)     ketoconazole (NIZORAL) 2 % shampoo Apply 1 application topically as  directed. PRN     Naproxen Sodium (ALEVE PO) Take by mouth as needed.     triamcinolone cream (KENALOG) 0.1 % Apply 1 application topically daily as needed.     No current facility-administered medications on file prior to visit.    There were no vitals taken for this visit.       Objective:   Physical Exam Vitals and nursing note reviewed.  Constitutional:      General: He is not in acute distress.    Appearance: Normal appearance. He is well-developed and normal weight.  HENT:     Head: Normocephalic and atraumatic.     Right Ear: Tympanic membrane, ear canal and external ear normal. There is no impacted cerumen.     Left Ear: Tympanic membrane, ear canal and external ear normal. There is no impacted cerumen.     Nose: Nose normal. No congestion or rhinorrhea.     Mouth/Throat:     Mouth: Mucous membranes are moist.     Pharynx: Oropharynx is clear. No oropharyngeal exudate or posterior oropharyngeal erythema.  Eyes:     General:        Right eye: No discharge.        Left eye: No discharge.     Extraocular Movements: Extraocular movements intact.     Conjunctiva/sclera: Conjunctivae normal.     Pupils: Pupils are equal, round, and reactive to light.  Neck:     Vascular: No carotid bruit.     Trachea: No tracheal deviation.  Cardiovascular:     Rate and Rhythm: Normal rate and regular rhythm.     Pulses: Normal pulses.     Heart sounds: Murmur (slight, heard best over precordium) heard.    No friction rub. No gallop.  Pulmonary:     Effort: Pulmonary effort is normal. No respiratory distress.     Breath sounds: Normal breath sounds. No stridor. No wheezing, rhonchi or rales.  Chest:     Chest wall: No tenderness.  Abdominal:  General: Bowel sounds are normal. There is no distension.     Palpations: Abdomen is soft. There is no mass.     Tenderness: There is no abdominal tenderness. There is no right CVA tenderness, left CVA tenderness, guarding or rebound.      Hernia: No hernia is present.  Musculoskeletal:        General: No swelling, tenderness, deformity or signs of injury. Normal range of motion.     Cervical back: Normal range of motion and neck supple.     Right lower leg: No edema.     Left lower leg: No edema.  Lymphadenopathy:     Cervical: No cervical adenopathy.  Skin:    General: Skin is warm and dry.     Capillary Refill: Capillary refill takes less than 2 seconds.     Coloration: Skin is not jaundiced or pale.     Findings: No bruising, erythema, lesion or rash.  Neurological:     General: No focal deficit present.     Mental Status: He is alert and oriented to person, place, and time.     Cranial Nerves: No cranial nerve deficit.     Sensory: No sensory deficit.     Motor: No weakness.     Coordination: Coordination normal.     Gait: Gait normal.     Deep Tendon Reflexes: Reflexes normal.  Psychiatric:        Mood and Affect: Mood normal.        Behavior: Behavior normal.        Thought Content: Thought content normal.        Judgment: Judgment normal.      Assessment & Plan:  1. Routine general medical examination at a health care facility - Start exercising more frequently. Continue to eat healthy  - CBC with Differential/Platelet; Future - Comprehensive metabolic panel; Future - Lipid panel; Future - TSH; Future - Hemoglobin A1c; Future - Hemoglobin A1c - TSH - Lipid panel - Comprehensive metabolic panel - CBC with Differential/Platelet - POC Urinalysis Dipstick  2. Benign prostatic hyperplasia with incomplete bladder emptying - Did not want to start medication at this time  - PSA; Future - PSA  3. Chronic diastolic heart failure ([place]) - Follow up with Cardiology as directed  - CBC with Differential/Platelet; Future - Comprehensive metabolic panel; Future - Lipid panel; Future - TSH; Future - TSH - Lipid panel - Comprehensive metabolic panel - CBC with Differential/Platelet  4.  Hypertriglyceridemia - Consider fenofibrate  - CBC with Differential/Platelet; Future - Comprehensive metabolic panel; Future - Lipid panel; Future - TSH; Future - TSH - Lipid panel - Comprehensive metabolic panel - CBC with Differential/Platelet  5. White matter disease - Follow up with Neurology as directed - CBC with Differential/Platelet; Future - Comprehensive metabolic panel; Future - Lipid panel; Future - TSH; Future - TSH - Lipid panel - Comprehensive metabolic panel - CBC with Differential/Platelet  6. Need for hepatitis C screening test  - Hep C Antibody; Future - Hep C Antibody  7. Need for immunization against influenza  - Flu Vaccine QUAD High Dose(Fluad)  8. Multiple thyroid nodules  - US THYROID; Future  [name], NP

## 2021-03-07 ENCOUNTER — Encounter: Payer: Self-pay | Admitting: Adult Health

## 2021-03-07 LAB — HEPATITIS C ANTIBODY
Hepatitis C Ab: NONREACTIVE
SIGNAL TO CUT-OFF: 0.21 (ref <1.00)

## 2021-03-08 NOTE — Telephone Encounter (Signed)
Please advise 

## 2021-03-09 DIAGNOSIS — Z820 Family history of epilepsy and other diseases of the nervous system: Secondary | ICD-10-CM | POA: Diagnosis not present

## 2021-03-09 DIAGNOSIS — R419 Unspecified symptoms and signs involving cognitive functions and awareness: Secondary | ICD-10-CM | POA: Diagnosis not present

## 2021-03-09 DIAGNOSIS — I679 Cerebrovascular disease, unspecified: Secondary | ICD-10-CM | POA: Diagnosis not present

## 2021-03-09 DIAGNOSIS — G629 Polyneuropathy, unspecified: Secondary | ICD-10-CM | POA: Diagnosis not present

## 2021-03-09 DIAGNOSIS — H53123 Transient visual loss, bilateral: Secondary | ICD-10-CM | POA: Diagnosis not present

## 2021-03-09 DIAGNOSIS — R9082 White matter disease, unspecified: Secondary | ICD-10-CM | POA: Diagnosis not present

## 2021-03-09 DIAGNOSIS — R4189 Other symptoms and signs involving cognitive functions and awareness: Secondary | ICD-10-CM | POA: Diagnosis not present

## 2021-03-19 ENCOUNTER — Ambulatory Visit
Admission: RE | Admit: 2021-03-19 | Discharge: 2021-03-19 | Disposition: A | Discharge status: Home / Self Care | Payer: Medicare PPO | Source: Ambulatory Visit | Attending: Adult Health | Admitting: Adult Health

## 2021-03-19 DIAGNOSIS — E042 Nontoxic multinodular goiter: Secondary | ICD-10-CM

## 2021-03-19 DIAGNOSIS — E041 Nontoxic single thyroid nodule: Secondary | ICD-10-CM | POA: Diagnosis not present

## 2021-03-20 ENCOUNTER — Encounter: Payer: Self-pay | Admitting: Adult Health

## 2021-03-21 NOTE — Telephone Encounter (Signed)
Please advise 

## 2021-05-03 ENCOUNTER — Encounter: Payer: Self-pay | Admitting: Adult Health

## 2021-05-07 NOTE — Telephone Encounter (Signed)
pT has been scheduled

## 2021-05-08 ENCOUNTER — Ambulatory Visit (INDEPENDENT_AMBULATORY_CARE_PROVIDER_SITE_OTHER): Payer: Medicare PPO

## 2021-05-08 ENCOUNTER — Other Ambulatory Visit: Payer: Self-pay

## 2021-05-08 ENCOUNTER — Encounter: Payer: Self-pay | Admitting: Adult Health

## 2021-05-08 ENCOUNTER — Ambulatory Visit (INDEPENDENT_AMBULATORY_CARE_PROVIDER_SITE_OTHER): Payer: Medicare PPO | Admitting: Adult Health

## 2021-05-08 VITALS — BP 124/80 | HR 58 | Temp 97.5°F | Ht 71.25 in | Wt 201.0 lb

## 2021-05-08 DIAGNOSIS — M79642 Pain in left hand: Secondary | ICD-10-CM | POA: Diagnosis not present

## 2021-05-08 NOTE — Patient Instructions (Addendum)
It was great seeing you today   I am going to get an xray of the left hand and send you to a hand surgeon.

## 2021-05-08 NOTE — Progress Notes (Signed)
Subjective:    Patient ID: EVERETTE MALL, male    DOB: [birth year], 76 y.o.   MRN: [id number]  HPI 76 year old male who  has a past medical history of Abnormality of gait, Arthritis, Atrial fibrillation ([place]), BPH (benign prostatic hyperplasia), Cardiomyopathy ([place]), Cervical disc disorder, Chicken pox, Closed fracture of one or more phalanges of foot, Clotting disorder ([place]), Combined systolic and diastolic cardiac dysfunction, DDD (degenerative disc disease), cervical, Depression, Elevated PSA, Fracture of foot bone, right, closed (1964), GERD (gastroesophageal reflux disease), Hammer toe, Hayfever, Hearing loss, Heart murmur, Hyperlipidemia, Neoplasm of uncertain behavior of skin, Pilonidal cyst with abscess, Polyneuropathy, Rheumatic fever, Seasonal allergies, Talipes cavus, and Tinnitus.  He presents to the office today for 3 to 4 weeks of left hand pain.  He has developed a painful/stiffness/aching that started in his ring finger of his left hand and has also started to include middle and pointer finger as well.  This occludes him from making a fist and has some decreased grip strength.  At home he has tried warm and cold therapies without without improvement.  When he tries to make a fist he gets pain along the DIP joints on the dorsal aspect of the fingers  Denies trauma    Review of Systems See HPI   Past Medical History:  Diagnosis Date   Abnormality of gait    Arthritis    Atrial fibrillation (HCC)    BPH (benign prostatic hyperplasia)    Cardiomyopathy (HCC)    Cervical disc disorder    Chicken pox    Closed fracture of one or more phalanges of foot    Clotting disorder (HCC)    low platelets   Combined systolic and diastolic cardiac dysfunction    DDD (degenerative disc disease), cervical    Depression    Elevated PSA    Fracture of foot bone, right, closed 1964   GERD (gastroesophageal reflux disease)    Hammer toe    Hayfever    Hearing loss    Heart murmur     Hyperlipidemia    Neoplasm of uncertain behavior of skin    Pilonidal cyst with abscess    Polyneuropathy    Rheumatic fever    Seasonal allergies    Talipes cavus    Tinnitus     Social History   Socioeconomic History   Marital status: Widowed    Spouse name: Not on file   Number of children: Not on file   Years of education: Not on file   Highest education level: Not on file  Occupational History   Occupation: Retired  Tobacco Use   Smoking status: Former    Packs/day: 2.00    Years: 9.00    Pack years: 18.00    Types: Cigarettes    Start date: 24    Quit date: 1969    Years since quitting: 38.1   Smokeless tobacco: Former  Scientific laboratory technician Use: Never used  Substance and Sexual Activity   Alcohol use: Yes    Comment: on occasion, maybe 3-4 times per month   Drug use: No   Sexual activity: Not on file  Other Topics Concern   Not on file  Social History Narrative   Lives alone   Right handed   Caffeine: 1-2 cups tea/day and 1 cup coffee some  days   Social Determinants of Health   Financial Resource Strain: Low Risk    Difficulty of Paying Living Expenses: Not  hard at all  Food Insecurity: No Food Insecurity   Worried About Charity fundraiser in the Last Year: Never true   Ran Out of Food in the Last Year: Never true  Transportation Needs: No Transportation Needs   Lack of Transportation (Medical): No   Lack of Transportation (Non-Medical): No  Physical Activity: Insufficiently Active   Days of Exercise per Week: 4 days   Minutes of Exercise per Session: 30 min  Stress: No Stress Concern Present   Feeling of Stress : Not at all  Social Connections: Socially Isolated   Frequency of Communication with Friends and Family: More than three times a week   Frequency of Social Gatherings with Friends and Family: Once a week   Attends Religious Services: Never   Marine scientist or Organizations: No   Attends Archivist Meetings: Never    Marital Status: Widowed  Human resources officer Violence: Not At Risk   Fear of Current or Ex-Partner: No   Emotionally Abused: No   Physically Abused: No   Sexually Abused: No    Past Surgical History:  Procedure Laterality Date   COLONOSCOPY     2006 normal exam   INGUINAL HERNIA REPAIR     PILONIDAL CYST EXCISION  1966   WISDOM TOOTH EXTRACTION      Family History  Problem Relation Age of Onset   Arthritis Mother    Breast cancer Mother    Arthritis Father    Heart disease Father    Stroke Father    Hypertension Father    Prostate cancer Father    Dementia Father        vascular    Stroke Paternal Grandfather    Stroke Maternal Grandmother    Dementia Paternal Aunt        vascular   Dementia Paternal Uncle        vascular   Colon cancer Neg Hx    Esophageal cancer Neg Hx    Stomach cancer Neg Hx    Rectal cancer Neg Hx     No Known Allergies  Current Outpatient Medications on File Prior to Visit  Medication Sig Dispense Refill   acetaminophen (TYLENOL) 325 MG tablet Take by mouth as needed.     Betaine, Trimethylglycine, (TMG, TRIMETHYLGLYCINE, PO) Take by mouth. Taking 2 capsules     cholecalciferol (VITAMIN D3) 25 MCG (1000 UNIT) tablet Take 1,000 Units by mouth daily.     ibuprofen (ADVIL) 200 MG tablet Take 200 mg by mouth as needed.     ketoconazole (NIZORAL) 2 % shampoo Apply 1 application topically as directed. PRN     Naproxen Sodium (ALEVE PO) Take by mouth as needed.     triamcinolone cream (KENALOG) 0.1 % Apply 1 application topically daily as needed.     No current facility-administered medications on file prior to visit.    BP 124/80    Pulse (!) 58    Temp (!) 97.5 F (36.4 C) (Oral)    Ht 5' 11.25" (1.81 m)    Wt 201 lb (91.2 kg)    SpO2 99%    BMI 27.84 kg/m       Objective:   Physical Exam Vitals and nursing note reviewed.  Constitutional:      Appearance: Normal appearance.  Musculoskeletal:        General: Tenderness present. No  swelling or deformity.     Comments: Unable to make fist.  No swelling of the fingers or  hand noted.  No signs of infection.  No signs of trigger finger.  Stable mild thickening of tissue under skin of palmer surface.   Skin:    General: Skin is warm and dry.     Capillary Refill: Capillary refill takes less than 2 seconds.  Neurological:     General: No focal deficit present.     Mental Status: He is alert and oriented to person, place, and time.  Psychiatric:        Mood and Affect: Mood normal.        Behavior: Behavior normal.        Thought Content: Thought content normal.      Assessment & Plan:  1. 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

## 2021-05-09 NOTE — Telephone Encounter (Signed)
FYI

## 2021-05-13 ENCOUNTER — Encounter: Payer: Self-pay | Admitting: Adult Health

## 2021-05-14 NOTE — Telephone Encounter (Signed)
FYI

## 2021-05-21 DIAGNOSIS — R200 Anesthesia of skin: Secondary | ICD-10-CM | POA: Diagnosis not present

## 2021-05-21 DIAGNOSIS — R202 Paresthesia of skin: Secondary | ICD-10-CM | POA: Diagnosis not present

## 2021-05-21 DIAGNOSIS — M65332 Trigger finger, left middle finger: Secondary | ICD-10-CM | POA: Diagnosis not present

## 2021-05-21 DIAGNOSIS — M65342 Trigger finger, left ring finger: Secondary | ICD-10-CM | POA: Diagnosis not present

## 2021-05-21 DIAGNOSIS — M65341 Trigger finger, right ring finger: Secondary | ICD-10-CM | POA: Diagnosis not present

## 2021-05-21 DIAGNOSIS — M720 Palmar fascial fibromatosis [Dupuytren]: Secondary | ICD-10-CM | POA: Diagnosis not present

## 2021-05-30 DIAGNOSIS — G5613 Other lesions of median nerve, bilateral upper limbs: Secondary | ICD-10-CM | POA: Diagnosis not present

## 2021-06-04 DIAGNOSIS — M65332 Trigger finger, left middle finger: Secondary | ICD-10-CM | POA: Diagnosis not present

## 2021-06-04 DIAGNOSIS — G5603 Carpal tunnel syndrome, bilateral upper limbs: Secondary | ICD-10-CM | POA: Diagnosis not present

## 2021-06-04 DIAGNOSIS — M65342 Trigger finger, left ring finger: Secondary | ICD-10-CM | POA: Diagnosis not present

## 2021-06-04 DIAGNOSIS — M65341 Trigger finger, right ring finger: Secondary | ICD-10-CM | POA: Diagnosis not present

## 2021-07-02 DIAGNOSIS — M65341 Trigger finger, right ring finger: Secondary | ICD-10-CM | POA: Diagnosis not present

## 2021-07-02 DIAGNOSIS — G5603 Carpal tunnel syndrome, bilateral upper limbs: Secondary | ICD-10-CM | POA: Diagnosis not present

## 2021-07-02 DIAGNOSIS — M65342 Trigger finger, left ring finger: Secondary | ICD-10-CM | POA: Diagnosis not present

## 2021-07-02 DIAGNOSIS — M720 Palmar fascial fibromatosis [Dupuytren]: Secondary | ICD-10-CM | POA: Diagnosis not present

## 2021-07-02 DIAGNOSIS — M65332 Trigger finger, left middle finger: Secondary | ICD-10-CM | POA: Diagnosis not present

## 2021-07-06 ENCOUNTER — Encounter: Payer: Self-pay | Admitting: Cardiovascular Disease

## 2021-07-06 ENCOUNTER — Encounter: Payer: Self-pay | Admitting: Adult Health

## 2021-07-06 NOTE — Telephone Encounter (Signed)
Please advise 

## 2021-07-06 NOTE — Telephone Encounter (Signed)
Patient reports having episodes of a burning sensation around the diaphragm area that goes away with sips of cold water. He is concerned if it is from heartburn or a cardiac event. He stated he has been diagnosed with apical variant hypertrophic cardiomyopathy. When he has the burning sensation, he is usually sitting or at his computer working. His bp cuff is not working. O2 sat 98%, P 76. He denies SOB. He denies chest pain/pressure/squeezing.  He denies back/jaw/arm pain. He also reports cramping in feet, calves, hands. He has appointment with his PCP in May. He wanted to know if he should go to the ED if it happens again. I recommended that if he has a concern about the burning sensation and wants to determine the origin of it, he should go to the ED to be evaluated. He is not on any cardiac or GI meds. Last blood work was in Nov. 22 ?

## 2021-07-10 NOTE — Telephone Encounter (Signed)
Can we please have him come in for a nurse visit ECG? Wednesday would be great since I will be in the echo reading room, so you do not have to bother DOD. ?

## 2021-07-11 ENCOUNTER — Ambulatory Visit (INDEPENDENT_AMBULATORY_CARE_PROVIDER_SITE_OTHER): Payer: Medicare PPO | Admitting: *Deleted

## 2021-07-11 DIAGNOSIS — I422 Other hypertrophic cardiomyopathy: Secondary | ICD-10-CM | POA: Diagnosis not present

## 2021-07-11 NOTE — Progress Notes (Signed)
? ?  Nurse Visit  ? ?Date of Encounter: 07/11/2021 ?ID: Marc Thornton, DOB 07-Aug-1945, MRN [id number] ? ?PCP:  [name], NP ?  ?[facility] ?Cardiologist:  Sanda Klein, MD    ? ? ?Visit Details  ? ?VS:   ?BP 112/68 ?HR 61bpm ?Weight: 191 lbs ? ?Wt Readings from Last 3 Encounters:  ?05/08/21 201 lb (91.2 kg)  ?03/06/21 198 lb (89.8 kg)  ?12/26/20 194 lb 3.2 oz (88.1 kg)  ?  ? ?Reason for visit: EKG check per Dr. Sallyanne Kuster - regarding MyChart message 07/06/21 ?Performed today: Vitals, EKG, and Provider consulted: DOD Dr. Claiborne Billings reviewed EKG , no changes from previous ?Changes (medications, testing, etc.): none ?Length of Visit: 30 minutes (including review of H&P, vitals, EKG, MD review, and documentation) ? ? ? ?Medications Adjustments/Labs and Tests Ordered: ?Orders Placed This Encounter  ?Procedures  ? EKG 12-Lead  ? ?No orders of the defined types were placed in this encounter. ? ? ? ?Signed, ?Fidel Levy, RN  ?07/11/2021 2:43 PM ? ?

## 2021-07-11 NOTE — Patient Instructions (Signed)
Your EKG today was reviewed by the Doctor of the Day (Dr. Claiborne Billings) and your primary cardiologist Dr. Sallyanne Kuster. There are no significant changes noted. Continue current medications/treatment plan. ?

## 2021-07-18 DIAGNOSIS — I679 Cerebrovascular disease, unspecified: Secondary | ICD-10-CM | POA: Diagnosis not present

## 2021-07-18 DIAGNOSIS — R0683 Snoring: Secondary | ICD-10-CM | POA: Diagnosis not present

## 2021-07-18 DIAGNOSIS — Z72820 Sleep deprivation: Secondary | ICD-10-CM | POA: Diagnosis not present

## 2021-07-18 DIAGNOSIS — R4189 Other symptoms and signs involving cognitive functions and awareness: Secondary | ICD-10-CM | POA: Diagnosis not present

## 2021-07-21 ENCOUNTER — Encounter: Payer: Self-pay | Admitting: Adult Health

## 2021-07-30 ENCOUNTER — Encounter: Payer: Self-pay | Admitting: Adult Health

## 2021-07-31 NOTE — Telephone Encounter (Signed)
Please advise 

## 2021-08-01 ENCOUNTER — Encounter: Payer: Self-pay | Admitting: Adult Health

## 2021-08-01 ENCOUNTER — Encounter: Payer: Self-pay | Admitting: Cardiovascular Disease

## 2021-08-01 NOTE — Telephone Encounter (Signed)
Please advise 

## 2021-08-22 ENCOUNTER — Ambulatory Visit: Payer: Medicare PPO

## 2021-08-22 ENCOUNTER — Ambulatory Visit (INDEPENDENT_AMBULATORY_CARE_PROVIDER_SITE_OTHER): Payer: Medicare PPO

## 2021-08-22 VITALS — BP 120/60 | HR 60 | Temp 97.8°F | Ht 71.5 in | Wt 187.3 lb

## 2021-08-22 DIAGNOSIS — Z0000 Encounter for general adult medical examination without abnormal findings: Secondary | ICD-10-CM | POA: Diagnosis not present

## 2021-08-22 NOTE — Progress Notes (Signed)
? ?Subjective:  ? Marc Thornton is a 76 y.o. male who presents for Medicare Annual/Subsequent preventive examination. ? ?Review of Systems    ? ? ?   ?Objective:  ?  ?Today's Vitals  ? 08/22/21 1345  ?BP: 120/60  ?Pulse: 60  ?Temp: 97.8 ?F (36.6 ?C)  ?TempSrc: Oral  ?SpO2: 96%  ?Weight: 187 lb 4.8 oz (85 kg)  ?Height: 5' 11.5" (1.816 m)  ? ?Body mass index is 25.76 kg/m?. ? ? ?  08/22/2021  ?  2:02 PM 08/16/2020  ?  1:50 PM 05/13/2017  ?  3:34 PM 04/09/2017  ?  2:54 PM  ?Advanced Directives  ?Does Patient Have a Medical Advance Directive? Yes Yes No No  ?Type of Paramedic of [place];Living will Healthcare Power of Attorney    ?Does patient want to make changes to medical advance directive? No - Patient declined     ?Copy of [place] in Chart? No - copy requested No - copy requested    ?Would patient like information on creating a medical advance directive?   No - Patient declined   ? ? ?Current Medications (verified) ?Outpatient Encounter Medications as of 08/22/2021  ?Medication Sig  ? acetaminophen (TYLENOL) 325 MG tablet Take by mouth as needed.  ? Betaine, Trimethylglycine, (TMG, TRIMETHYLGLYCINE, PO) Take by mouth. Taking 2 capsules  ? cholecalciferol (VITAMIN D3) 25 MCG (1000 UNIT) tablet Take 1,000 Units by mouth daily.  ? ibuprofen (ADVIL) 200 MG tablet Take 200 mg by mouth as needed.  ? ketoconazole (NIZORAL) 2 % shampoo Apply 1 application topically as directed. PRN  ? Naproxen Sodium (ALEVE PO) Take by mouth as needed.  ? triamcinolone cream (KENALOG) 0.1 % Apply 1 application topically daily as needed.  ? ?No facility-administered encounter medications on file as of 08/22/2021.  ? ? ?Allergies (verified) ?Patient has no known allergies.  ? ?History: ?Past Medical History:  ?Diagnosis Date  ? Abnormality of gait   ? Arthritis   ? Atrial fibrillation ([place])   ? BPH (benign prostatic hyperplasia)   ? Cardiomyopathy ([place])   ? Cervical disc disorder   ? Chicken pox   ?  Closed fracture of one or more phalanges of foot   ? Clotting disorder ([place])   ? low platelets  ? Combined systolic and diastolic cardiac dysfunction   ? DDD (degenerative disc disease), cervical   ? Depression   ? Elevated PSA   ? Fracture of foot bone, right, closed 1964  ? GERD (gastroesophageal reflux disease)   ? Hammer toe   ? Hayfever   ? Hearing loss   ? Heart murmur   ? Hyperlipidemia   ? Neoplasm of uncertain behavior of skin   ? Pilonidal cyst with abscess   ? Polyneuropathy   ? Rheumatic fever   ? Seasonal allergies   ? Talipes cavus   ? Tinnitus   ? ?Past Surgical History:  ?Procedure Laterality Date  ? COLONOSCOPY    ? 2006 normal exam  ? INGUINAL HERNIA REPAIR    ? PILONIDAL CYST EXCISION  1966  ? WISDOM TOOTH EXTRACTION    ? ?Family History  ?Problem Relation Age of Onset  ? Arthritis Mother   ? Breast cancer Mother   ? Arthritis Father   ? Heart disease Father   ? Stroke Father   ? Hypertension Father   ? Prostate cancer Father   ? Dementia Father   ?     vascular   ?  Stroke Paternal Grandfather   ? Stroke Maternal Grandmother   ? Dementia Paternal Aunt   ?     vascular  ? Dementia Paternal Uncle   ?     vascular  ? Colon cancer Neg Hx   ? Esophageal cancer Neg Hx   ? Stomach cancer Neg Hx   ? Rectal cancer Neg Hx   ? ?Social History  ? ?Socioeconomic History  ? Marital status: Widowed  ?  Spouse name: Not on file  ? Number of children: Not on file  ? Years of education: Not on file  ? Highest education level: Not on file  ?Occupational History  ? Occupation: Retired  ?Tobacco Use  ? Smoking status: Former  ?  Packs/day: 2.00  ?  Years: 9.00  ?  Pack years: 18.00  ?  Types: Cigarettes  ?  Start date: 23  ?  Quit date: 1969  ?  Years since quitting: 54.4  ? Smokeless tobacco: Former  ?Vaping Use  ? Vaping Use: Never used  ?Substance and Sexual Activity  ? Alcohol use: Yes  ?  Comment: on occasion, maybe 3-4 times per month  ? Drug use: No  ? Sexual activity: Not on file  ?Other Topics Concern  ?  Not on file  ?Social History Narrative  ? Lives alone  ? Right handed  ? Caffeine: 1-2 cups tea/day and 1 cup coffee some  days  ? ?Social Determinants of Health  ? ?Financial Resource Strain: Low Risk   ? Difficulty of Paying Living Expenses: Not hard at all  ?Food Insecurity: No Food Insecurity  ? Worried About Charity fundraiser in the Last Year: Never true  ? Ran Out of Food in the Last Year: Never true  ?Transportation Needs: No Transportation Needs  ? Lack of Transportation (Medical): No  ? Lack of Transportation (Non-Medical): No  ?Physical Activity: Sufficiently Active  ? Days of Exercise per Week: 5 days  ? Minutes of Exercise per Session: 30 min  ?Stress: No Stress Concern Present  ? Feeling of Stress : Not at all  ?Social Connections: Socially Isolated  ? Frequency of Communication with Friends and Family: More than three times a week  ? Frequency of Social Gatherings with Friends and Family: More than three times a week  ? Attends Religious Services: Never  ? Active Member of Clubs or Organizations: No  ? Attends Archivist Meetings: Never  ? Marital Status: Widowed  ? ? ?Tobacco Counseling ?Counseling given: Not Answered ? ? ?Clinical Intake: ? ?Diabetic?  No ? ?Activities of Daily Living ? ?  08/22/2021  ?  2:00 PM 03/06/2021  ?  1:09 PM  ?In your present state of health, do you have any difficulty performing the following activities:  ?Hearing? 0 1  ?Vision? 0 0  ?Difficulty concentrating or making decisions? 0 0  ?Walking or climbing stairs? 0 0  ?Dressing or bathing? 0 0  ?Doing errands, shopping? 0 0  ?Preparing Food and eating ? N   ?Using the Toilet? N   ?In the past six months, have you accidently leaked urine? N   ?Do you have problems with loss of bowel control? N   ?Managing your Medications? N   ?Managing your Finances? N   ?Housekeeping or managing your Housekeeping? N   ? ? ?Patient Care Team: ?[name], NP as PCP - General (Family Medicine) ?Croitoru, Dani Gobble, MD as PCP -  Cardiology (Cardiology) ? ?Indicate any recent  Medical Services you may have received from other than [facility] providers in the past year (date may be approximate). ? ?   ?Assessment:  ? This is a routine wellness examination for Levan. ? ?Hearing/Vision screen ?Hearing Screening - Comments:: No hearing difficulty ?Vision Screening - Comments:: Wears glasses. Followed by [facility] ? ?Dietary issues and exercise activities discussed: ?Exercise limited by: None identified ? ? Goals Addressed   ? ?  ?  ?  ?  ?  ? This Visit's Progress  ?   Patient Stated (pt-stated)     ?   Continue walking for exercise and gardening. ?  ? ?  ? ?Depression Screen ? ?  08/22/2021  ?  1:57 PM 05/08/2021  ? 10:10 AM 03/06/2021  ?  1:09 PM 08/16/2020  ?  1:48 PM 03/01/2020  ? 10:03 AM  ?PHQ 2/9 Scores  ?PHQ - 2 Score 0 0 0 0 0  ?PHQ- 9 Score  2 3    ?  ?Fall Risk ? ?  08/22/2021  ?  2:01 PM 05/08/2021  ? 10:10 AM 03/06/2021  ?  1:09 PM 08/16/2020  ?  1:51 PM  ?Fall Risk   ?Falls in the past year? 0 '1 1 1  '$ ?Number falls in past yr: 0 0 1 1  ?Injury with Fall? 0 '1 1 1  '$ ?Risk for fall due to : No Fall Risks   Impaired balance/gait;Impaired vision  ?Follow up    Falls prevention discussed  ? ? ?FALL RISK PREVENTION PERTAINING TO THE HOME: ? ?Any stairs in or around the home? Yes  ?If so, are there any without handrails? No ?Home free of loose throw rugs in walkways, pet beds, electrical cords, etc? Yes  ?Adequate lighting in your home to reduce risk of falls? Yes  ? ?ASSISTIVE DEVICES UTILIZED TO PREVENT FALLS: ? ?Life alert? No  ?Use of a cane, walker or w/c? No  ?Grab bars in the bathroom? Yes  ?Shower chair or bench in shower? Yes  ?Elevated toilet seat or a handicapped toilet? Yes  ? ?TIMED UP AND GO: ? ?Was the test performed? Yes .  ?Length of time to ambulate 10 feet: 5 ? sec.  ? ?Gait steady and fast without use of assistive device ? ?Cognitive Function: ?  ? ?  08/19/2019  ?  4:06 PM  ?Montreal Cognitive Assessment   ?Visuospatial/  Executive (0/5) 5  ?Naming (0/3) 3  ?Attention: Read list of digits (0/2) 2  ?Attention: Read list of letters (0/1) 1  ?Attention: Serial 7 subtraction starting at 100 (0/3) 3  ?Language: Repeat phrase (0/2) 2

## 2021-08-22 NOTE — Patient Instructions (Addendum)
?Marc Thornton , ?Thank you for taking time to come for your Medicare Wellness Visit. I appreciate your ongoing commitment to your health goals. Please review the following plan we discussed and let me know if I can assist you in the future.  ? ?These are the goals we discussed: ? Goals   ? ?   Patient Stated (pt-stated)   ?   Continue walking for exercise and gardening. ?  ? ?  ?  ?This is a list of the screening recommended for you and due dates:  ?Health Maintenance  ?Topic Date Due  ? Zoster (Shingles) Vaccine (1 of 2) 11/22/2021*  ? Flu Shot  11/06/2021  ? Tetanus Vaccine  12/12/2027  ? Pneumonia Vaccine  Completed  ? COVID-19 Vaccine  Completed  ? Hepatitis C Screening: USPSTF Recommendation to screen - Ages 88-79 yo.  Completed  ? HPV Vaccine  Aged Out  ? Colon Cancer Screening  Discontinued  ?*Topic was postponed. The date shown is not the original due date.  ? ?Advanced directives: Yes Patient will submit copy ? ?Conditions/risks identified: None ? ?Next appointment: Follow up in one year for your annual wellness visit.  ? ?Preventive Care 70 Years and Older, Male ?Preventive care refers to lifestyle choices and visits with your health care provider that can promote health and wellness. ?What does preventive care include? ?A yearly physical exam. This is also called an annual well check. ?Dental exams once or twice a year. ?Routine eye exams. Ask your health care provider how often you should have your eyes checked. ?Personal lifestyle choices, including: ?Daily care of your teeth and gums. ?Regular physical activity. ?Eating a healthy diet. ?Avoiding tobacco and drug use. ?Limiting alcohol use. ?Practicing safe sex. ?Taking low doses of aspirin every day. ?Taking vitamin and mineral supplements as recommended by your health care provider. ?What happens during an annual well check? ?The services and screenings done by your health care provider during your annual well check will depend on your age, overall  health, lifestyle risk factors, and family history of disease. ?Counseling  ?Your health care provider may ask you questions about your: ?Alcohol use. ?Tobacco use. ?Drug use. ?Emotional well-being. ?Home and relationship well-being. ?Sexual activity. ?Eating habits. ?History of falls. ?Memory and ability to understand (cognition). ?Work and work Statistician. ?Screening  ?You may have the following tests or measurements: ?Height, weight, and BMI. ?Blood pressure. ?Lipid and cholesterol levels. These may be checked every 5 years, or more frequently if you are over 50 years old. ?Skin check. ?Lung cancer screening. You may have this screening every year starting at age 92 if you have a 30-pack-year history of smoking and currently smoke or have quit within the past 15 years. ?Fecal occult blood test (FOBT) of the stool. You may have this test every year starting at age 62. ?Flexible sigmoidoscopy or colonoscopy. You may have a sigmoidoscopy every 5 years or a colonoscopy every 10 years starting at age 42. ?Prostate cancer screening. Recommendations will vary depending on your family history and other risks. ?Hepatitis C blood test. ?Hepatitis B blood test. ?Sexually transmitted disease (STD) testing. ?Diabetes screening. This is done by checking your blood sugar (glucose) after you have not eaten for a while (fasting). You may have this done every 1-3 years. ?Abdominal aortic aneurysm (AAA) screening. You may need this if you are a current or former smoker. ?Osteoporosis. You may be screened starting at age 91 if you are at high risk. ?Talk with your health  care provider about your test results, treatment options, and if necessary, the need for more tests. ?Vaccines  ?Your health care provider may recommend certain vaccines, such as: ?Influenza vaccine. This is recommended every year. ?Tetanus, diphtheria, and acellular pertussis (Tdap, Td) vaccine. You may need a Td booster every 10 years. ?Zoster vaccine. You may  need this after age 41. ?Pneumococcal 13-valent conjugate (PCV13) vaccine. One dose is recommended after age 29. ?Pneumococcal polysaccharide (PPSV23) vaccine. One dose is recommended after age 28. ?Talk to your health care provider about which screenings and vaccines you need and how often you need them. ?This information is not intended to replace advice given to you by your health care provider. Make sure you discuss any questions you have with your health care provider. ?Document Released: 04/21/2015 Document Revised: 12/13/2015 Document Reviewed: 01/24/2015 ?Elsevier Interactive Patient Education ? 2017 [place]. ? ?Fall Prevention in the Home ?Falls can cause injuries. They can happen to people of all ages. There are many things you can do to make your home safe and to help prevent falls. ?What can I do on the outside of my home? ?Regularly fix the edges of walkways and driveways and fix any cracks. ?Remove anything that might make you trip as you walk through a door, such as a raised step or threshold. ?Trim any bushes or trees on the path to your home. ?Use bright outdoor lighting. ?Clear any walking paths of anything that might make someone trip, such as rocks or tools. ?Regularly check to see if handrails are loose or broken. Make sure that both sides of any steps have handrails. ?Any raised decks and porches should have guardrails on the edges. ?Have any leaves, snow, or ice cleared regularly. ?Use sand or salt on walking paths during winter. ?Clean up any spills in your garage right away. This includes oil or grease spills. ?What can I do in the bathroom? ?Use night lights. ?Install grab bars by the toilet and in the tub and shower. Do not use towel bars as grab bars. ?Use non-skid mats or decals in the tub or shower. ?If you need to sit down in the shower, use a plastic, non-slip stool. ?Keep the floor dry. Clean up any water that spills on the floor as soon as it happens. ?Remove soap buildup in  the tub or shower regularly. ?Attach bath mats securely with double-sided non-slip rug tape. ?Do not have throw rugs and other things on the floor that can make you trip. ?What can I do in the bedroom? ?Use night lights. ?Make sure that you have a light by your bed that is easy to reach. ?Do not use any sheets or blankets that are too big for your bed. They should not hang down onto the floor. ?Have a firm chair that has side arms. You can use this for support while you get dressed. ?Do not have throw rugs and other things on the floor that can make you trip. ?What can I do in the kitchen? ?Clean up any spills right away. ?Avoid walking on wet floors. ?Keep items that you use a lot in easy-to-reach places. ?If you need to reach something above you, use a strong step stool that has a grab bar. ?Keep electrical cords out of the way. ?Do not use floor polish or wax that makes floors slippery. If you must use wax, use non-skid floor wax. ?Do not have throw rugs and other things on the floor that can make you trip. ?What can  I do with my stairs? ?Do not leave any items on the stairs. ?Make sure that there are handrails on both sides of the stairs and use them. Fix handrails that are broken or loose. Make sure that handrails are as long as the stairways. ?Check any carpeting to make sure that it is firmly attached to the stairs. Fix any carpet that is loose or worn. ?Avoid having throw rugs at the top or bottom of the stairs. If you do have throw rugs, attach them to the floor with carpet tape. ?Make sure that you have a light switch at the top of the stairs and the bottom of the stairs. If you do not have them, ask someone to add them for you. ?What else can I do to help prevent falls? ?Wear shoes that: ?Do not have high heels. ?Have rubber bottoms. ?Are comfortable and fit you well. ?Are closed at the toe. Do not wear sandals. ?If you use a stepladder: ?Make sure that it is fully opened. Do not climb a closed  stepladder. ?Make sure that both sides of the stepladder are locked into place. ?Ask someone to hold it for you, if possible. ?Clearly mark and make sure that you can see: ?Any grab bars or handrails. ?First and last

## 2021-09-21 ENCOUNTER — Ambulatory Visit (INDEPENDENT_AMBULATORY_CARE_PROVIDER_SITE_OTHER): Payer: Medicare PPO | Admitting: Adult Health

## 2021-09-21 ENCOUNTER — Encounter: Payer: Self-pay | Admitting: Adult Health

## 2021-09-21 VITALS — BP 120/70 | HR 59 | Temp 98.0°F | Wt 191.0 lb

## 2021-09-21 DIAGNOSIS — S6992XA Unspecified injury of left wrist, hand and finger(s), initial encounter: Secondary | ICD-10-CM

## 2021-09-21 DIAGNOSIS — Z23 Encounter for immunization: Secondary | ICD-10-CM | POA: Diagnosis not present

## 2021-09-21 NOTE — Progress Notes (Signed)
Subjective:    Patient ID: Marc Thornton, male    DOB: [birth year], 76 y.o.   MRN: [id number]  HPI 76 year old male who  has a past medical history of Abnormality of gait, Arthritis, Atrial fibrillation ([place]), BPH (benign prostatic hyperplasia), Cardiomyopathy ([place]), Cervical disc disorder, Chicken pox, Closed fracture of one or more phalanges of foot, Clotting disorder ([place]), Combined systolic and diastolic cardiac dysfunction, DDD (degenerative disc disease), cervical, Depression, Elevated PSA, Fracture of foot bone, right, closed (1964), GERD (gastroesophageal reflux disease), Hammer toe, Hayfever, Hearing loss, Heart murmur, Hyperlipidemia, Neoplasm of uncertain behavior of skin, Pilonidal cyst with abscess, Polyneuropathy, Rheumatic fever, Seasonal allergies, Talipes cavus, and Tinnitus.   He presents to the office today for an acute issue.  Yesterday morning he was working outside and using a sledgehammer to drive a rusty metal fence post into his vegetable garden.  While driving the post his hand slipped and despite wearing other work gloves he injured his left index finger.  He washed the best that he could with soap and water.  And then bandaged with Neosporin ointment and 2 x 2 gauze.  The bleeding has stopped.   He has been keeping the wound clean and dry.   He is in need of a tetanus booster since his last was in July 2013.  Review of Systems See HPI   Past Medical History:  Diagnosis Date   Abnormality of gait    Arthritis    Atrial fibrillation ([facility])    BPH (benign prostatic hyperplasia)    Cardiomyopathy ([facility])    Cervical disc disorder    Chicken pox    Closed fracture of one or more phalanges of foot    Clotting disorder ([facility])    low platelets   Combined systolic and diastolic cardiac dysfunction    DDD (degenerative disc disease), cervical    Depression    Elevated PSA    Fracture of foot bone, right, closed 1964   GERD (gastroesophageal reflux disease)    Hammer  toe    Hayfever    Hearing loss    Heart murmur    Hyperlipidemia    Neoplasm of uncertain behavior of skin    Pilonidal cyst with abscess    Polyneuropathy    Rheumatic fever    Seasonal allergies    Talipes cavus    Tinnitus     Social History   Socioeconomic History   Marital status: Widowed    Spouse name: Not on file   Number of children: Not on file   Years of education: Not on file   Highest education level: Doctorate  Occupational History   Occupation: Retired  Tobacco Use   Smoking status: Former    Packs/day: 2.00    Years: 9.00    Total pack years: 18.00    Types: Cigarettes    Start date: 70    Quit date: 1969    Years since quitting: 54.4   Smokeless tobacco: Former  Scientific laboratory technician Use: Never used  Substance and Sexual Activity   Alcohol use: Yes    Comment: on occasion, maybe 3-4 times per month   Drug use: No   Sexual activity: Not on file  Other Topics Concern   Not on file  Social History Narrative   Lives alone   Right handed   Caffeine: 1-2 cups tea/day and 1 cup coffee some  days   Social Determinants of Radio broadcast assistant  Strain: Low Risk  (09/21/2021)   Overall Financial Resource Strain (CARDIA)    Difficulty of Paying Living Expenses: Not hard at all  Food Insecurity: No Food Insecurity (09/21/2021)   Hunger Vital Sign    Worried About Running Out of Food in the Last Year: Never true    Ran Out of Food in the Last Year: Never true  Transportation Needs: No Transportation Needs (09/21/2021)   PRAPARE - Hydrologist (Medical): No    Lack of Transportation (Non-Medical): No  Physical Activity: Sufficiently Active (09/21/2021)   Exercise Vital Sign    Days of Exercise per Week: 5 days    Minutes of Exercise per Session: 30 min  Stress: Stress Concern Present (09/21/2021)   [place]    Feeling of Stress : To some extent   Social Connections: Socially Isolated (09/21/2021)   Social Connection and Isolation Panel [NHANES]    Frequency of Communication with Friends and Family: More than three times a week    Frequency of Social Gatherings with Friends and Family: More than three times a week    Attends Religious Services: Never    Marine scientist or Organizations: No    Attends Archivist Meetings: Never    Marital Status: Widowed  Intimate Partner Violence: Not At Risk (08/22/2021)   Humiliation, Afraid, Rape, and Kick questionnaire    Fear of Current or Ex-Partner: No    Emotionally Abused: No    Physically Abused: No    Sexually Abused: No    Past Surgical History:  Procedure Laterality Date   COLONOSCOPY     2006 normal exam   INGUINAL HERNIA REPAIR     PILONIDAL CYST EXCISION  1966   WISDOM TOOTH EXTRACTION      Family History  Problem Relation Age of Onset   Arthritis Mother    Breast cancer Mother    Arthritis Father    Heart disease Father    Stroke Father    Hypertension Father    Prostate cancer Father    Dementia Father        vascular    Stroke Paternal Grandfather    Stroke Maternal Grandmother    Dementia Paternal Aunt        vascular   Dementia Paternal Uncle        vascular   Colon cancer Neg Hx    Esophageal cancer Neg Hx    Stomach cancer Neg Hx    Rectal cancer Neg Hx     No Known Allergies  Current Outpatient Medications on File Prior to Visit  Medication Sig Dispense Refill   acetaminophen (TYLENOL) 325 MG tablet Take by mouth as needed.     Betaine, Trimethylglycine, (TMG, TRIMETHYLGLYCINE, PO) Take by mouth. Taking 2 capsules     cholecalciferol (VITAMIN D3) 25 MCG (1000 UNIT) tablet Take 1,000 Units by mouth daily.     ibuprofen (ADVIL) 200 MG tablet Take 200 mg by mouth as needed.     ketoconazole (NIZORAL) 2 % shampoo Apply 1 application topically as directed. PRN     Naproxen Sodium (ALEVE PO) Take by mouth as needed.     triamcinolone  cream (KENALOG) 0.1 % Apply 1 application topically daily as needed.     No current facility-administered medications on file prior to visit.    BP 120/70   Pulse (!) 59   Temp 98 F (36.7 C)  Wt 191 lb (86.6 kg)   SpO2 97%   BMI 26.27 kg/m       Objective:   Physical Exam Vitals and nursing note reviewed.  Constitutional:      Appearance: Normal appearance.  Skin:    General: Skin is warm and dry.     Capillary Refill: Capillary refill takes less than 2 seconds.     Comments: Skin flap noted on left index finger. Wound appears clean and dry. No signs of infection   Neurological:     General: No focal deficit present.     Mental Status: He is alert and oriented to person, place, and time.  Psychiatric:        Mood and Affect: Mood normal.        Behavior: Behavior normal.        Thought Content: Thought content normal.        Judgment: Judgment normal.       Assessment & Plan:  1. Injury of finger of left hand, initial encounter - no signs of infection - continue with triple antibiotic ointment and bandage - Will update tdap   2. Need for tetanus booster  - Tdap vaccine greater than or equal to 7yo IM

## 2021-09-21 NOTE — Telephone Encounter (Signed)
Pt has been scheduled.  °

## 2021-11-04 IMAGING — MR MR ABDOMEN WO/W CM
11 of 17 series · 28 of 48 positions shown · IV contrast (Default)
Comparison: None.

CLINICAL DATA: Renal cysts and liver cysts reported by outside
ultrasound

EXAM:
MRI ABDOMEN WITHOUT AND WITH CONTRAST
TECHNIQUE: Multiplanar multisequence MR imaging of the abdomen was performed
both before and after the administration of intravenous contrast.
CONTRAST:  8mL GADAVIST GADOBUTROL 1 MMOL/ML IV SOLN

[Series 3: cor haste · coronal · 5.0mm · 0.68mm/px · 2 of 34 slices shown]
[im 1/34]
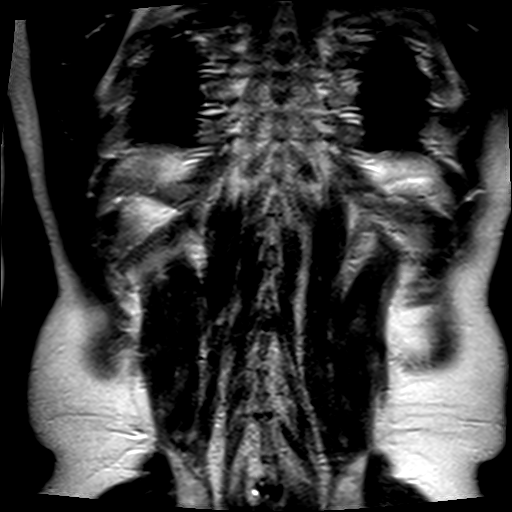
[im 34/34]
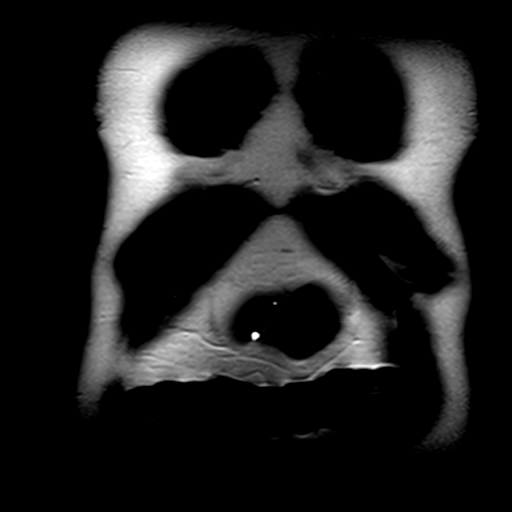

[Series 4: axial haste · axial · 6.0mm · 0.68mm/px · z∈[-140,+84]mm · 2 of 35 slices shown]
[im 1/35]
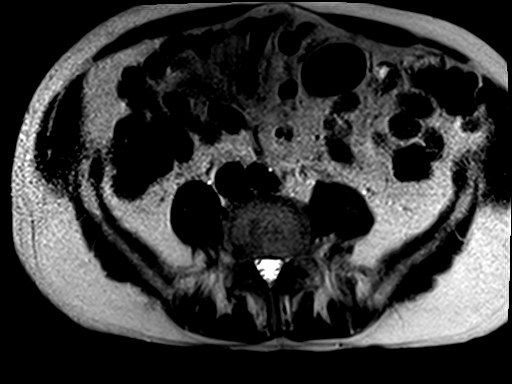
[im 35/35]
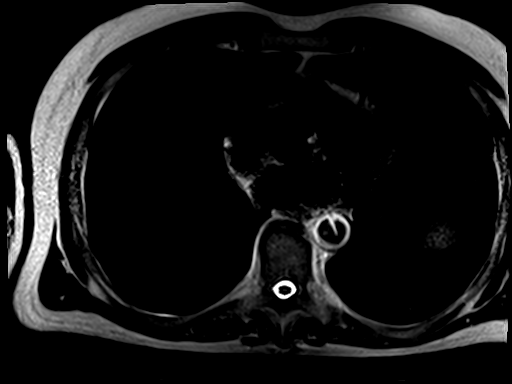

[Series 5: T1 · axial · 6.0mm · 0.68mm/px · z∈[-130,+81]mm · 4 of 66 slices shown]
[im 1/66]
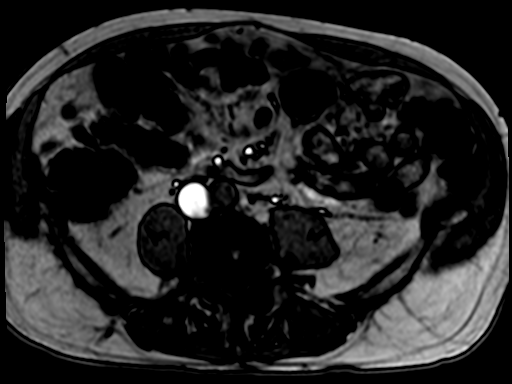
[im 22/66]
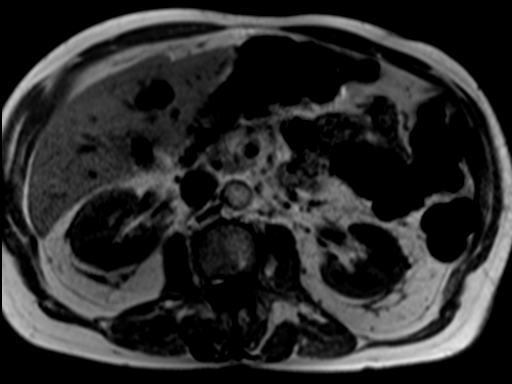
[im 44/66]
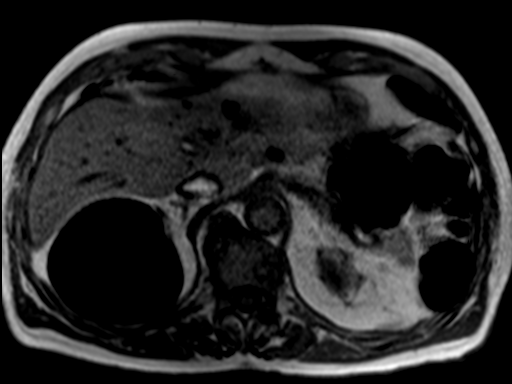
[im 66/66]
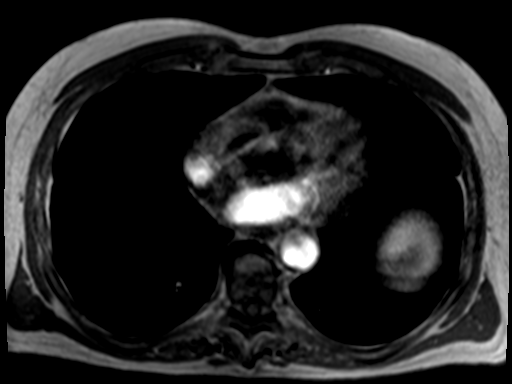

[Series 6: bSSFP · axial · 4.0mm · 0.68mm/px · z∈[-162,+78]mm · 3 of 61 slices shown]
[im 1/61]
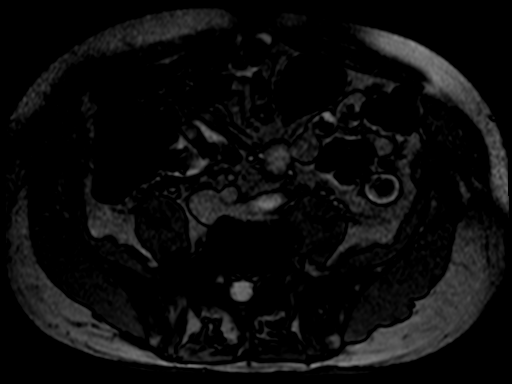
[im 31/61]
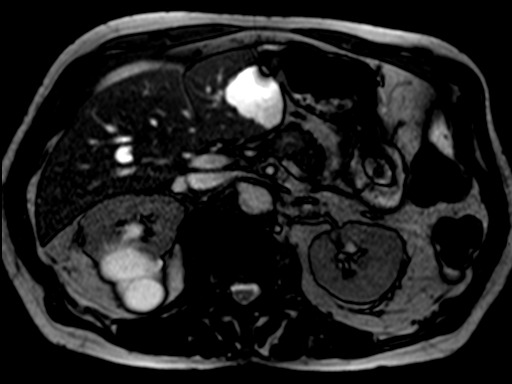
[im 61/61]
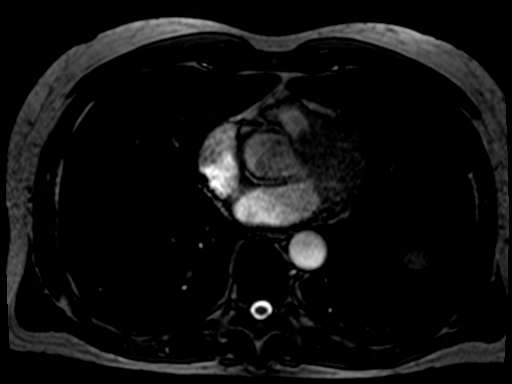

[Series 7: T2 fat-sat · axial · 6.0mm · 1.09mm/px · z∈[-140,+83]mm · 2 of 32 slices shown]
[im 1/32]
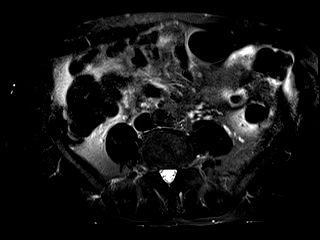
[im 32/32]
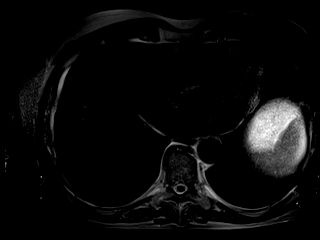

[Series 8: ep2d_[id number]_500_800_p2_trig · axial · 6.0mm · 1.82mm/px · z∈[-153,+71]mm · 4 of 96 slices shown]
[im 1/96]
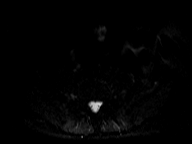
[im 32/96]
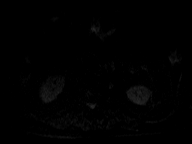
[im 64/96]
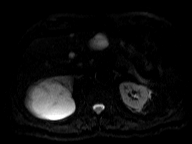
[im 96/96]
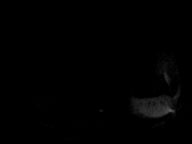

[Series 9: ep2d_[id number]_800_p2_trig_adc · axial · 6.0mm · 1.82mm/px · 1 of 32 slices shown]
[im 1/32]
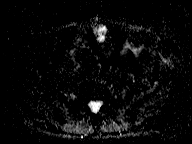

[Series 10: T1 dynamic · axial · non-contrast · 2.5mm · 0.74mm/px · z∈[-136,+82]mm · 3 of 88 slices shown]
[im 1/88]
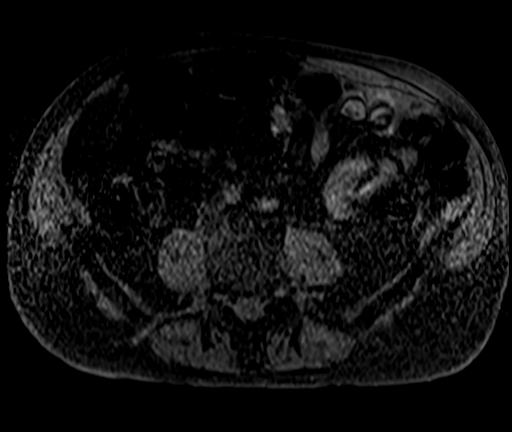
[im 44/88]
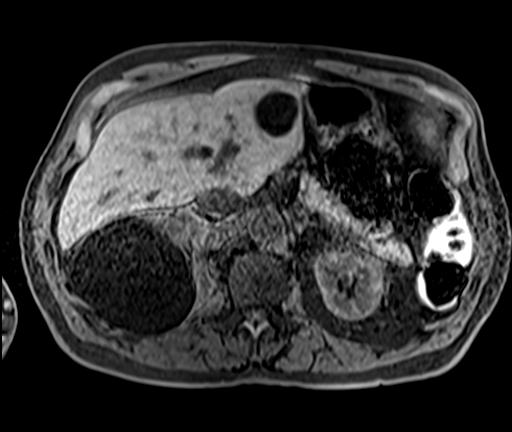
[im 88/88]
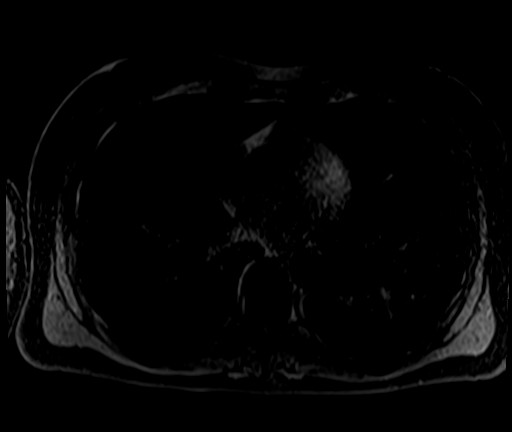

[Series 11: T1 dynamic post-contrast · axial · 2.5mm · 0.74mm/px · z∈[-136,+82]mm · 3 of 88 slices shown (1 of 3)]
[im 1/88]
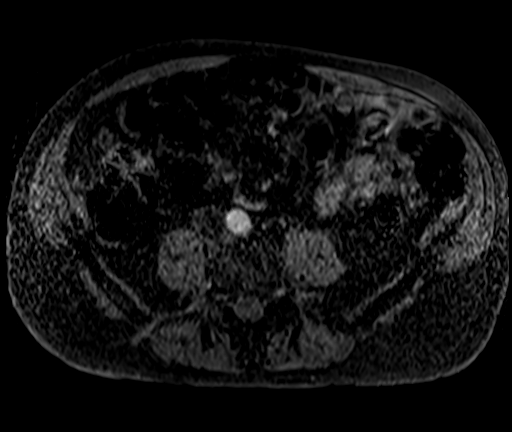
[im 44/88]
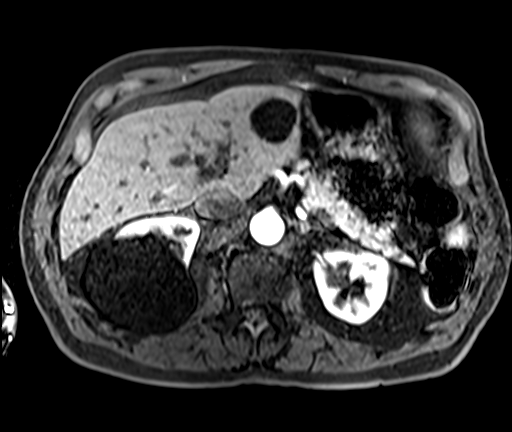
[im 88/88]
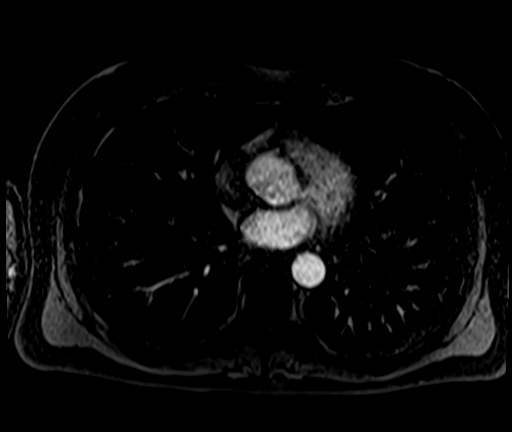

[Series 12: T1 dynamic post-contrast · axial · 2.5mm · 0.74mm/px · z∈[-136,+82]mm · 3 of 88 slices shown (2 of 3)]
[im 1/88]
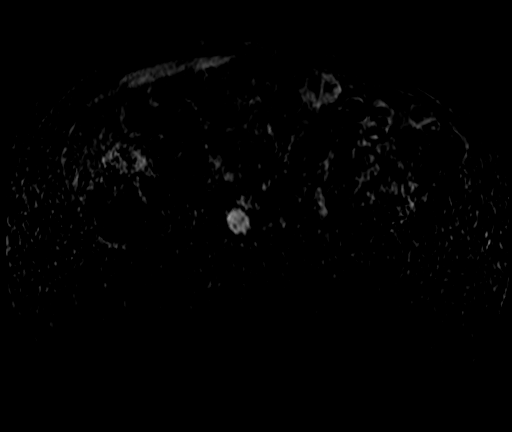
[im 44/88]
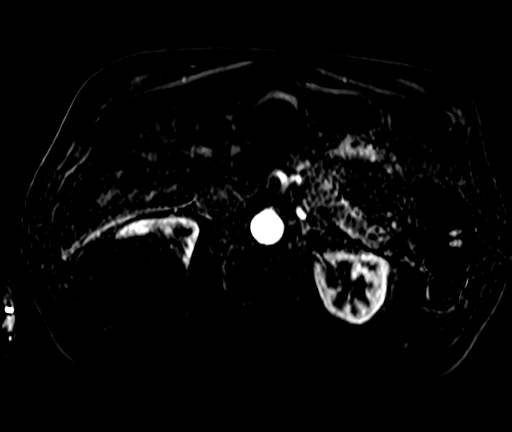
[im 88/88]
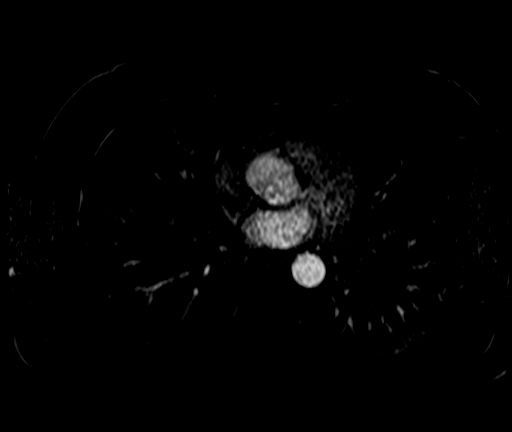

[Series 13: T1 dynamic post-contrast · axial · 2.5mm · 0.74mm/px · 1 of 88 slices shown (3 of 3)]
[im 1/88]
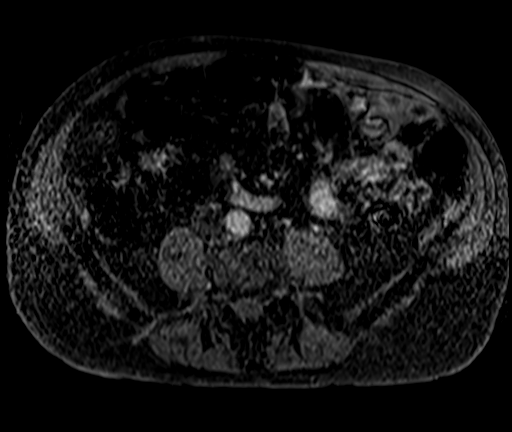

[28 of 48 positions shown; findings below may reference images not displayed]

FINDINGS: Lower chest: No acute findings.

Hepatobiliary: There are numerous fluid signal, nonenhancing cysts
throughout the liver, some of which are lobulated and thinly
septated. No solid mass, suspicious contrast enhancement, or other
parenchymal abnormality identified. No gallstones. No biliary ductal
dilatation.

Pancreas: No mass, inflammatory changes, or other parenchymal
abnormality identified. No pancreatic ductal dilatation.

Spleen:  Within normal limits in size and appearance.

Adrenals/Urinary Tract: No solid masses identified. There is an
exophytic, thinly septated cyst of the superior pole of the right
kidney measuring 9.9 cm. No solid component or suspicious contrast
enhancement. No evidence of hydronephrosis.

Stomach/Bowel: Visualized portions within the abdomen are
unremarkable.

Vascular/Lymphatic: No pathologically enlarged lymph nodes
identified. No abdominal aortic aneurysm demonstrated.

Other:  None.

Musculoskeletal: No suspicious bone lesions identified.
IMPRESSION: 1. There is an exophytic, thinly septated cyst of the superior pole
of the right kidney measuring 9.9 cm. No solid component or
suspicious contrast enhancement. No further follow-up or
characterization is required for this benign cyst.
2. Numerous fluid signal, nonenhancing cysts throughout the liver,
some of which are lobulated and thinly septated. No solid mass,
suspicious contrast enhancement, or other parenchymal abnormality
identified. No further follow-up or characterization is required for
these benign cysts.

## 2021-11-27 NOTE — Telephone Encounter (Signed)
FYI

## 2021-12-06 ENCOUNTER — Other Ambulatory Visit: Payer: Self-pay | Admitting: Adult Health

## 2021-12-06 ENCOUNTER — Encounter: Payer: Self-pay | Admitting: Adult Health

## 2021-12-06 DIAGNOSIS — R972 Elevated prostate specific antigen [PSA]: Secondary | ICD-10-CM

## 2021-12-06 NOTE — Telephone Encounter (Signed)
Please advise 

## 2021-12-21 NOTE — Telephone Encounter (Signed)
Called pt and advised that he should follow up with urology. Pt stated that we just needed to follow the links and have Tommi Rumps put the information in for him so that he can have the panel done. Pt stated that they have to do it that way In case something comes up with urology he would be followed by a physician. I advised pt that it needed to come from urology and we cannot follow a link to put any information in on a non approved website. Pt verbalized understanding and will reach out to urology for assistance.

## 2021-12-27 NOTE — Telephone Encounter (Signed)
FYI

## 2022-01-15 ENCOUNTER — Encounter: Payer: Self-pay | Admitting: Adult Health

## 2022-01-16 ENCOUNTER — Ambulatory Visit: Payer: Medicare PPO | Attending: Cardiovascular Disease | Admitting: Cardiovascular Disease

## 2022-01-16 ENCOUNTER — Encounter: Payer: Self-pay | Admitting: Cardiovascular Disease

## 2022-01-16 VITALS — BP 122/86 | HR 67 | Ht 71.0 in | Wt 187.6 lb

## 2022-01-16 DIAGNOSIS — I422 Other hypertrophic cardiomyopathy: Secondary | ICD-10-CM

## 2022-01-16 DIAGNOSIS — N5201 Erectile dysfunction due to arterial insufficiency: Secondary | ICD-10-CM | POA: Diagnosis not present

## 2022-01-16 DIAGNOSIS — N401 Enlarged prostate with lower urinary tract symptoms: Secondary | ICD-10-CM | POA: Diagnosis not present

## 2022-01-16 DIAGNOSIS — R3911 Hesitancy of micturition: Secondary | ICD-10-CM | POA: Diagnosis not present

## 2022-01-16 DIAGNOSIS — R972 Elevated prostate specific antigen [PSA]: Secondary | ICD-10-CM | POA: Diagnosis not present

## 2022-01-16 DIAGNOSIS — Z125 Encounter for screening for malignant neoplasm of prostate: Secondary | ICD-10-CM | POA: Diagnosis not present

## 2022-01-16 DIAGNOSIS — E781 Pure hyperglyceridemia: Secondary | ICD-10-CM | POA: Diagnosis not present

## 2022-01-16 DIAGNOSIS — R3912 Poor urinary stream: Secondary | ICD-10-CM | POA: Diagnosis not present

## 2022-01-16 DIAGNOSIS — I5032 Chronic diastolic (congestive) heart failure: Secondary | ICD-10-CM | POA: Diagnosis not present

## 2022-01-16 NOTE — Patient Instructions (Signed)
Medication Instructions:  No changes *If you need a refill on your cardiac medications before your next appointment, please call your pharmacy*   Lab Work: None ordered If you have labs (blood work) drawn today and your tests are completely normal, you will receive your results only by: MyChart Message (if you have MyChart) OR A paper copy in the mail If you have any lab test that is abnormal or we need to change your treatment, we will call you to review the results.   Testing/Procedures: None ordered   Follow-Up: At [facility], you and your health needs are our priority.  As part of our continuing mission to provide you with exceptional heart care, we have created designated Provider Care Teams.  These Care Teams include your primary Cardiologist (physician) and Advanced Practice Providers (APPs -  Physician Assistants and Nurse Practitioners) who all work together to provide you with the care you need, when you need it.  We recommend signing up for the patient portal called "MyChart".  Sign up information is provided on this After Visit Summary.  MyChart is used to connect with patients for Virtual Visits (Telemedicine).  Patients are able to view lab/test results, encounter notes, upcoming appointments, etc.  Non-urgent messages can be sent to your provider as well.   To learn more about what you can do with MyChart, go to [email or web address].    Your next appointment:   12 month(s)  The format for your next appointment:   In Person  Provider:   Mihai Croitoru, MD      Important Information About Sugar       

## 2022-01-16 NOTE — Progress Notes (Signed)
Cardiology Office Note    Date:  01/17/2022   ID:  Marc Thornton, DOB [birth year], MRN [id number]  PCP:  [name], NP  Cardiologist:   Sanda Klein, MD   Chief complaint: Follow-up cardiomyopathy   History of Present Illness:  Marc Thornton is a 76 y.o. male with asymptomatic apical hypertrophic cardiomyopathy, to date without any clinically relevant episodes of arrhythmia or congestive heart failure.  Cardiac MRI shows a roughly 14% burden of scar by late gadolinium enhancement mostly located in the apical subendocardial area.  There is evidence of apical displacement of his papillary muscles.  He does not have left ventricular outflow tract obstruction.  There is no defined wall motion abnormality on echocardiogram, where he has hyperdynamic left ventricular systolic function.  Similar findings are present on his echocardiogram from 2020 although the diagnosis of apical variant hypertrophic cardiomyopathy was not defined on that report.  An event monitor in 2020 showed frequent PVCs and occasional very brief episodes of paroxysmal atrial tachycardia.  He did not have sustained or nonsustained VT or atrial fibrillation or significant bradycardia.  PYP scan did not show evidence of cardiac amyloidosis.  Nuclear stress testing in 2017 showed a hypertensive response to exercise but no perfusion abnormalities.  He feels well.  He remains very active.  He does have some chest tightness when initiating activity.  After he walks a distance of roughly 400 m he notices a tightness in his retrosternal area that resolves after another few minutes while he continues walking.  Otherwise, he denies problems with chest pain or shortness of breath either at rest or with activity, palpitations, dizziness or syncope.  He has not had lower extremity edema, orthopnea or PND.  He takes care of his own yard without difficulty.  He does not have a personal history of arrhythmia or syncope or family history of  sudden cardiac death or ventricular tachycardia.    He has a family history of neurological decline at advanced ages (36s) which has led to the demise of his father and paternal uncles with a fairly predictable pattern of dementia culminating in a stroke.  This is due to a familial pattern of "small vessel disease".  His paternal grandfather had intracranial hemorrhage in his early 40s.  His paternal aunt and uncle had vascular dementia.  His father had coronary artery disease, bypass surgery at age 41 congestive heart failure and took diuretics for about 20 years, had a stroke at age 44 and died at age 36.  He is concerned that he has noticed subtle reductions in his frontal lobe functions,  such as complex task strategizing and difficulty finding words.  His MRI does confirm some white matter abnormalities on T2 flair imaging suggestive of microvascular ischemia.  He has been seeing Dr. Krista Blue in the neurology clinic.  The nuclear perfusion images were normal. He was only able to exercise for 4-1/2 minutes (5.6 METS) before he reached a heart rate of 149 bpm. He had a hypertensive response to exercise with a diastolic blood pressure that increased to 107 mmHg. Exercise performance was good. There was evidence of diastolic dysfunction. The mitral annulus diastolic velocities were markedly reduced and the E/e' ratio was borderline significant for elevated filling pressures.   He spent a year and a half in [place] in the [employer] and had some exposure to [employer]. He quit smoking 50 years ago.     Past Medical History:  Diagnosis Date   Abnormality of gait  Arthritis    Atrial fibrillation (HCC)    BPH (benign prostatic hyperplasia)    Cardiomyopathy (HCC)    Cervical disc disorder    Chicken pox    Closed fracture of one or more phalanges of foot    Clotting disorder (HCC)    low platelets   Combined systolic and diastolic cardiac dysfunction    DDD (degenerative disc disease), cervical     Depression    Elevated PSA    Fracture of foot bone, right, closed 1964   GERD (gastroesophageal reflux disease)    Hammer toe    Hayfever    Hearing loss    Heart murmur    Hyperlipidemia    Neoplasm of uncertain behavior of skin    Pilonidal cyst with abscess    Polyneuropathy    Rheumatic fever    Seasonal allergies    Talipes cavus    Tinnitus     Past Surgical History:  Procedure Laterality Date   COLONOSCOPY     2006 normal exam   INGUINAL HERNIA REPAIR     PILONIDAL CYST EXCISION  1966   WISDOM TOOTH EXTRACTION      Current Medications: Outpatient Medications Prior to Visit  Medication Sig Dispense Refill   acetaminophen (TYLENOL) 325 MG tablet Take by mouth as needed.     Betaine, Trimethylglycine, (TMG, TRIMETHYLGLYCINE, PO) Take by mouth. Taking 2 capsules     cholecalciferol (VITAMIN D3) 25 MCG (1000 UNIT) tablet Take 1,000 Units by mouth daily.     ibuprofen (ADVIL) 200 MG tablet Take 200 mg by mouth as needed.     ketoconazole (NIZORAL) 2 % shampoo Apply 1 application topically as directed. PRN     Multiple Vitamins-Minerals (CENTRUM SILVER 50+MEN) TABS Take 1 tablet by mouth daily.     Naproxen Sodium (ALEVE PO) Take by mouth as needed.     triamcinolone cream (KENALOG) 0.1 % Apply 1 application topically daily as needed.     No facility-administered medications prior to visit.     Allergies:   Patient has no known allergies.   Social History   Socioeconomic History   Marital status: Widowed    Spouse name: Not on file   Number of children: Not on file   Years of education: Not on file   Highest education level: Doctorate  Occupational History   Occupation: Retired  Tobacco Use   Smoking status: Former    Packs/day: 2.00    Years: 9.00    Total pack years: 18.00    Types: Cigarettes    Start date: 23    Quit date: 1969    Years since quitting: 54.8   Smokeless tobacco: Former  Scientific laboratory technician Use: Never used  Substance and Sexual  Activity   Alcohol use: Yes    Comment: on occasion, maybe 3-4 times per month   Drug use: No   Sexual activity: Not on file  Other Topics Concern   Not on file  Social History Narrative   Lives alone   Right handed   Caffeine: 1-2 cups tea/day and 1 cup coffee some  days   Social Determinants of Health   Financial Resource Strain: Low Risk  (09/21/2021)   Overall Financial Resource Strain (CARDIA)    Difficulty of Paying Living Expenses: Not hard at all  Food Insecurity: No Food Insecurity (09/21/2021)   Hunger Vital Sign    Worried About Running Out of Food in the Last Year: Never true  Ran Out of Food in the Last Year: Never true  Transportation Needs: No Transportation Needs (09/21/2021)   PRAPARE - Hydrologist (Medical): No    Lack of Transportation (Non-Medical): No  Physical Activity: Sufficiently Active (09/21/2021)   Exercise Vital Sign    Days of Exercise per Week: 5 days    Minutes of Exercise per Session: 30 min  Stress: Stress Concern Present (09/21/2021)   [place]    Feeling of Stress : To some extent  Social Connections: Socially Isolated (09/21/2021)   Social Connection and Isolation Panel [NHANES]    Frequency of Communication with Friends and Family: More than three times a week    Frequency of Social Gatherings with Friends and Family: More than three times a week    Attends Religious Services: Never    Marine scientist or Organizations: No    Attends Archivist Meetings: Never    Marital Status: Widowed     Family History:  The patient's family history includes Arthritis in his father and mother; Breast cancer in his mother; Dementia in his father, paternal aunt, and paternal uncle; Heart disease in his father; Hypertension in his father; Prostate cancer in his father; Stroke in his father, maternal grandmother, and paternal grandfather.   ROS:    Please see the history of present illness.    ROS All other systems reviewed and are negative.   PHYSICAL EXAM:   VS:  BP 122/86 (BP Location: Left Arm, Patient Position: Sitting, Cuff Size: Normal)   Pulse 67   Ht '5\' 11"'$  (1.803 m)   Wt 187 lb 9.6 oz (85.1 kg)   SpO2 97%   BMI 26.16 kg/m      General: Alert, oriented x3, no distress, he appears fit, younger than stated age Head: no evidence of trauma, PERRL, EOMI, no exophtalmos or lid lag, no myxedema, no xanthelasma; normal ears, nose and oropharynx Neck: normal jugular venous pulsations and no hepatojugular reflux; brisk carotid pulses without delay and no carotid bruits Chest: clear to auscultation, no signs of consolidation by percussion or palpation, normal fremitus, symmetrical and full respiratory excursions Cardiovascular: normal position and quality of the apical impulse, regular rhythm, normal first and second heart sounds, 1/6 ejection murmur heard best at the right upper sternal border, no diastolic murmurs, rubs or gallops Abdomen: no tenderness or distention, no masses by palpation, no abnormal pulsatility or arterial bruits, normal bowel sounds, no hepatosplenomegaly Extremities: no clubbing, cyanosis or edema; 2+ radial, ulnar and brachial pulses bilaterally; 2+ right femoral, posterior tibial and dorsalis pedis pulses; 2+ left femoral, posterior tibial and dorsalis pedis pulses; no subclavian or femoral bruits Neurological: grossly nonfocal Psych: Normal mood and affect     Wt Readings from Last 3 Encounters:  01/16/22 187 lb 9.6 oz (85.1 kg)  09/21/21 191 lb (86.6 kg)  08/22/21 187 lb 4.8 oz (85 kg)      Studies/Labs Reviewed:   cMRI 10/24/2020: 1. Findings consistent with apical variant hypertrophic cardiomyopathy. ECG with marked T wave abnormalities, disproportionate apical thickening 15 mm with displaced hypertrophied papillary muscles, Systolic cavity obliteration, and hyper enhancement post  gadolinium involving 14.4% of the total myocardium   2.  Normal LV function quantitative EF 69%   3.  Normal RV size and function   4.  Normal cardiac valves   5.  Normal aortic root 3.7 cm  ECHO 12/10/2018:  1. The  left ventricle has hyperdynamic systolic function, with an ejection fraction of >65%. The cavity size was normal. Left ventricular diastolic Doppler parameters are consistent with impaired relaxation. Indeterminate filling pressures The E/e' is  8-15. No evidence of left ventricular regional wall motion abnormalities.  2. The right ventricle has normal systolic function. The cavity was normal. There is no increase in right ventricular wall thickness.  3. The mitral valve is grossly normal.  4. The aortic valve was not well visualized. No stenosis of the aortic valve.  5. The aorta is normal unless otherwise noted.  6. The interatrial septum was not well visualized.  On my review of the above echo images, I think he probably has apical variant hypertrophic cardiomyopathy.  There was no evidence of systolic anterior motion of the mitral valve or any left ventricular outflow tract obstruction at rest, but provocative maneuvers were not performed.  Event monitor 01/28/2019: Abnormal event monitor due to the presence of frequent PVCs and occasional brief runs of nonsustained atrial tachycardia.   There is no evidence of significant bradycardia, ventricular tachycardia or atrial fibrillation.  EKG: EKG is ordered today and is very similar to previous tracings. It shows normal sinus rhythm, left atrial abnormality, prominent left ventricular hypertrophy with deep T wave inversion especially seen in leads V2-V5.  Normal QTC 412 ms.  There is a sudden R wave transition in lead V2 and there is a isolated deep sharp Q waves in lead aVL. LABS: [facility] 10/20/2015 Cholesterol 178, triglycerides 303, HDL 43, LDL 74, hemoglobin A1c 5.4%  07/07/2019 Total cholesterol 167, HDL 38, LDL 94,  triglycerides 175 Hemoglobin A1c 5.5% Hemoglobin 15.4, creatinine 0.95, potassium 3.9, TSH 2.87  03/01/2020 Cholesterol 172, HDL 41, LDL 108, triglycerides 120 Hemoglobin A1c 5.3% Hemoglobin 14.7, creatinine 0.95, potassium 4.0, TSH 2.51, ALT 17  03/06/2021 Cholesterol 173, HDL 46.6, LDL 103, triglycerides 120 Hemoglobin A1c 5.4% Hemoglobin 15.7, creatinine 1.08, potassium 4.5, ALT 22, TSH 2.11  ASSESSMENT:    1. 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 Apical variant hypertrophic cardiomyopathy ([place])   3. Hypertriglyceridemia      PLAN:  In order of problems listed above:   CHF: Although he does not describe any limitations in his exercise ability doing activities of daily living, he did have somewhat reduced exercise tolerance on a standardized Bruce protocol treadmill test.  He has echo evidence of diastolic dysfunction, but has not had overt severe heart failure.  Has never required diuretics.  There is echo evidence of reduced diastolic relaxation properties of the left ventricle.  No indication to prescribe medications at this point Apical variant hypertrophic cardiomyopathy: Apical variant HCM has a more benign course than many other variations of genetic cardiomyopathy, proven by his longevity without any arrhythmic events or clinical heart failure.  There is no family history of early death from arrhythmia or heart failure.  Late gadolinium enhancement demonstrates a relatively small left ventricular scar.  Prognosis is likely to be very good.  Appears to have a very benign variation of this genetic disorder.  Asked him to promptly report any episodes of sustained palpitations, syncope or near syncope, worsening exertional dyspnea.  It is possible that his complaints of "walk-through angina" are related to subendocardial ischemia in the setting of hypertrophic cardiomyopathy, but the symptoms are mild and do not require specific therapy.    HyperTG: All lipid parameters are excellent on the most recent labs.  Even his HDL has shown improvement.  Cerebral small vessel disease: I think he is doing a great job addressing his risk factors with an excellent lipid profile, normal glucose metabolism, good resting blood pressure, healthy diet, moderate regular exercise.  Medication Adjustments/Labs and Tests Ordered: Current medicines are reviewed at length with the patient today.  Concerns regarding medicines are outlined above.  Medication changes, Labs and Tests ordered today are listed in the Patient Instructions below. Patient Instructions  Medication Instructions:  No changes *If you need a refill on your cardiac medications before your next appointment, please call your pharmacy*   Lab Work: None ordered If you have labs (blood work) drawn today and your tests are completely normal, you will receive your results only by: [place] (if you have MyChart) OR A paper copy in the mail If you have any lab test that is abnormal or we need to change your treatment, we will call you to review the results.   Testing/Procedures: None ordered   Follow-Up: At [facility], you and your health needs are our priority.  As part of our continuing mission to provide you with exceptional heart care, we have created designated Provider Care Teams.  These Care Teams include your primary Cardiologist (physician) and Advanced Practice Providers (APPs -  Physician Assistants and Nurse Practitioners) who all work together to provide you with the care you need, when you need it.  We recommend signing up for the patient portal called "MyChart".  Sign up information is provided on this After Visit Summary.  MyChart is used to connect with patients for Virtual Visits (Telemedicine).  Patients are able to view lab/test results, encounter notes, upcoming appointments, etc.  Non-urgent messages can be sent to your provider as well.   To learn  more about what you can do with MyChart, go to [email or web address].    Your next appointment:   12 month(s)  The format for your next appointment:   In Person  Provider:   Sanda Klein, MD     Important Information About Sugar         Signed, Sanda Klein, MD  01/17/2022 2:33 PM    [address], [place], [place]  [address] Phone: [phone]; Fax: [phone]

## 2022-01-24 ENCOUNTER — Other Ambulatory Visit: Payer: Self-pay | Admitting: Urology

## 2022-01-24 DIAGNOSIS — Z125 Encounter for screening for malignant neoplasm of prostate: Secondary | ICD-10-CM

## 2022-02-08 ENCOUNTER — Other Ambulatory Visit (HOSPITAL_COMMUNITY): Payer: Self-pay | Admitting: Urology

## 2022-02-08 DIAGNOSIS — Z125 Encounter for screening for malignant neoplasm of prostate: Secondary | ICD-10-CM

## 2022-02-08 DIAGNOSIS — R972 Elevated prostate specific antigen [PSA]: Secondary | ICD-10-CM

## 2022-02-11 DIAGNOSIS — G5602 Carpal tunnel syndrome, left upper limb: Secondary | ICD-10-CM | POA: Diagnosis not present

## 2022-02-11 DIAGNOSIS — G5603 Carpal tunnel syndrome, bilateral upper limbs: Secondary | ICD-10-CM | POA: Diagnosis not present

## 2022-02-11 DIAGNOSIS — M79641 Pain in right hand: Secondary | ICD-10-CM | POA: Diagnosis not present

## 2022-02-11 DIAGNOSIS — G5601 Carpal tunnel syndrome, right upper limb: Secondary | ICD-10-CM | POA: Diagnosis not present

## 2022-02-11 DIAGNOSIS — G6289 Other specified polyneuropathies: Secondary | ICD-10-CM | POA: Diagnosis not present

## 2022-02-11 DIAGNOSIS — M79642 Pain in left hand: Secondary | ICD-10-CM | POA: Diagnosis not present

## 2022-02-13 ENCOUNTER — Other Ambulatory Visit: Payer: Medicare PPO

## 2022-02-19 ENCOUNTER — Ambulatory Visit (HOSPITAL_COMMUNITY)
Admission: RE | Admit: 2022-02-19 | Discharge: 2022-02-19 | Disposition: A | Discharge status: Home / Self Care | Payer: No Typology Code available for payment source | Source: Ambulatory Visit | Attending: Urology | Admitting: Urology

## 2022-02-19 DIAGNOSIS — Z125 Encounter for screening for malignant neoplasm of prostate: Secondary | ICD-10-CM | POA: Insufficient documentation

## 2022-02-19 DIAGNOSIS — R972 Elevated prostate specific antigen [PSA]: Secondary | ICD-10-CM | POA: Insufficient documentation

## 2022-02-19 MED ORDER — GADOBUTROL 1 MMOL/ML IV SOLN
10.0000 mL | Freq: Once | INTRAVENOUS | Status: AC | PRN
  Administered 2022-02-19: 10 mL via INTRAVENOUS

## 2022-02-20 ENCOUNTER — Encounter: Payer: Self-pay | Admitting: Adult Health

## 2022-02-21 ENCOUNTER — Encounter: Payer: Self-pay | Admitting: Adult Health

## 2022-02-25 ENCOUNTER — Other Ambulatory Visit: Payer: No Typology Code available for payment source

## 2022-03-04 ENCOUNTER — Encounter: Payer: Self-pay | Admitting: Adult Health

## 2022-03-05 NOTE — Telephone Encounter (Signed)
FYI

## 2022-03-06 ENCOUNTER — Encounter: Payer: Self-pay | Admitting: Adult Health

## 2022-03-07 ENCOUNTER — Ambulatory Visit (INDEPENDENT_AMBULATORY_CARE_PROVIDER_SITE_OTHER): Payer: Medicare PPO | Admitting: Adult Health

## 2022-03-07 ENCOUNTER — Encounter: Payer: Self-pay | Admitting: Adult Health

## 2022-03-07 VITALS — BP 110/70 | HR 64 | Temp 97.8°F | Ht 71.0 in | Wt 186.0 lb

## 2022-03-07 DIAGNOSIS — Z0000 Encounter for general adult medical examination without abnormal findings: Secondary | ICD-10-CM | POA: Diagnosis not present

## 2022-03-07 DIAGNOSIS — Z23 Encounter for immunization: Secondary | ICD-10-CM | POA: Diagnosis not present

## 2022-03-07 DIAGNOSIS — R1310 Dysphagia, unspecified: Secondary | ICD-10-CM

## 2022-03-07 DIAGNOSIS — R3912 Poor urinary stream: Secondary | ICD-10-CM

## 2022-03-07 DIAGNOSIS — E559 Vitamin D deficiency, unspecified: Secondary | ICD-10-CM

## 2022-03-07 DIAGNOSIS — R9082 White matter disease, unspecified: Secondary | ICD-10-CM | POA: Diagnosis not present

## 2022-03-07 DIAGNOSIS — N401 Enlarged prostate with lower urinary tract symptoms: Secondary | ICD-10-CM

## 2022-03-07 DIAGNOSIS — I5032 Chronic diastolic (congestive) heart failure: Secondary | ICD-10-CM | POA: Diagnosis not present

## 2022-03-07 LAB — CBC WITH DIFFERENTIAL/PLATELET
Basophils Absolute: 0 10*3/uL (ref 0.0–0.1)
Basophils Relative: 0.6 % (ref 0.0–3.0)
Eosinophils Absolute: 0.2 10*3/uL (ref 0.0–0.7)
Eosinophils Relative: 2.3 % (ref 0.0–5.0)
HCT: 45.8 % (ref 39.0–52.0)
Hemoglobin: 15.4 g/dL (ref 13.0–17.0)
Lymphocytes Relative: 38.5 % (ref 12.0–46.0)
Lymphs Abs: 2.6 10*3/uL (ref 0.7–4.0)
MCHC: 33.7 g/dL (ref 30.0–36.0)
MCV: 94.6 fl (ref 78.0–100.0)
Monocytes Absolute: 0.6 10*3/uL (ref 0.1–1.0)
Monocytes Relative: 8.5 % (ref 3.0–12.0)
Neutro Abs: 3.4 10*3/uL (ref 1.4–7.7)
Neutrophils Relative %: 50.1 % (ref 43.0–77.0)
Platelets: 153 10*3/uL (ref 150.0–400.0)
RBC: 4.84 Mil/uL (ref 4.22–5.81)
RDW: 12.9 % (ref 11.5–15.5)
WBC: 6.8 10*3/uL (ref 4.0–10.5)

## 2022-03-07 LAB — IBC + FERRITIN
Ferritin: 86.2 ng/mL (ref 22.0–322.0)
Iron: 160 ug/dL (ref 42–165)
Saturation Ratios: 45.7 % (ref 20.0–50.0)
TIBC: 350 ug/dL (ref 250.0–450.0)
Transferrin: 250 mg/dL (ref 212.0–360.0)

## 2022-03-07 LAB — LIPID PANEL
Cholesterol: 159 mg/dL (ref 0–200)
HDL: 46.8 mg/dL (ref >39.00)
LDL Cholesterol: 96 mg/dL (ref 0–99)
NonHDL: 111.94
Total CHOL/HDL Ratio: 3
Triglycerides: 79 mg/dL (ref 0.0–149.0)
VLDL: 15.8 mg/dL (ref 0.0–40.0)

## 2022-03-07 LAB — COMPREHENSIVE METABOLIC PANEL
ALT: 19 U/L (ref 0–53)
AST: 30 U/L (ref 0–37)
Albumin: 4.6 g/dL (ref 3.5–5.2)
Alkaline Phosphatase: 55 U/L (ref 39–117)
BUN: 27 mg/dL — ABNORMAL HIGH (ref 6–23)
CO2: 29 mEq/L (ref 19–32)
Calcium: 9.5 mg/dL (ref 8.4–10.5)
Chloride: 103 mEq/L (ref 96–112)
Creatinine, Ser: 0.96 mg/dL (ref 0.40–1.50)
GFR: 76.03 mL/min (ref >60.00)
Glucose, Bld: 79 mg/dL (ref 70–99)
Potassium: 4.1 mEq/L (ref 3.5–5.1)
Sodium: 140 mEq/L (ref 135–145)
Total Bilirubin: 1 mg/dL (ref 0.2–1.2)
Total Protein: 7.7 g/dL (ref 6.0–8.3)

## 2022-03-07 LAB — TSH: TSH: 2.27 u[IU]/mL (ref 0.35–5.50)

## 2022-03-07 LAB — VITAMIN D 25 HYDROXY (VIT D DEFICIENCY, FRACTURES): VITD: 35.02 ng/mL (ref 30.00–100.00)

## 2022-03-07 LAB — PSA: PSA: 5.13 ng/mL — ABNORMAL HIGH (ref 0.10–4.00)

## 2022-03-07 NOTE — Progress Notes (Signed)
Subjective:    Patient ID: Marc Thornton, male    DOB: 25-Mar-1946, 76 y.o.   MRN: [id number]  HPI 76 year old male who  has a past medical history of Abnormality of gait, Arthritis, Atrial fibrillation ([place]), BPH (benign prostatic hyperplasia), Cardiomyopathy ([place]), Cervical disc disorder, Chicken pox, Closed fracture of one or more phalanges of foot, Clotting disorder ([place]), Combined systolic and diastolic cardiac dysfunction, DDD (degenerative disc disease), cervical, Depression, Elevated PSA, Fracture of foot bone, right, closed (1964), GERD (gastroesophageal reflux disease), Hammer toe, Hayfever, Hearing loss, Heart murmur, Hyperlipidemia, Neoplasm of uncertain behavior of skin, Pilonidal cyst with abscess, Polyneuropathy, Rheumatic fever, Seasonal allergies, Talipes cavus, and Tinnitus.  H/o CHF -managed by cardiology.  He does have some limitation on functional ability on formal stress testing although he did not have any symptoms during activities of daily living.  He does not require diuretics at this time.  Evidence of on echo of reduced diastolic relaxation properties of the left ventricle.  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  This was confirmed by MRI of typical findings on EKG.  He has no history of complex ventricular arrhythmia, history of syncope, or family history of sudden cardiac death or arrhythmia.  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  Cognitive decline with family history of vascular dementia. He is currently doing a study through the [facility].    BPH -currently had a prostate MRI which showed 2 lesions, one being grade 4 and the other being a grade 3.  He has a follow-up with urology on December 6 and then biopsy done on 03/23/2022. He was prescribed Flomax for BPH  symptoms but never started it   Vitamin D deficiency  - takes 1000 units daily + MVI + eats plenty of cereal.   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  He has not had any signs or symptoms of this in the past.  He also reports recurrent episodes of dysphagia ( especially with dry foods and his MVI), at times he  has aspirated. Reports that he does eat his meals when sitting in his chair which is often not sitting straight up.   Review of Systems  Constitutional: Negative.   HENT: Negative.    Eyes: Negative.   Respiratory:  Positive for choking.   Cardiovascular: Negative.   Gastrointestinal: Negative.   Endocrine: Negative.   Genitourinary:  Positive for decreased urine volume and difficulty urinating.  Musculoskeletal:  Positive for back pain.  Skin: Negative.   Allergic/Immunologic: Negative.   Neurological: Negative.   Hematological: Negative.   Psychiatric/Behavioral: Negative.    All other systems reviewed and are negative.  Past Medical History:  Diagnosis Date   Abnormality of gait    Arthritis    Atrial fibrillation (HCC)    BPH (benign prostatic hyperplasia)    Cardiomyopathy (HCC)    Cervical disc disorder    Chicken pox    Closed fracture of one or more phalanges of foot    Clotting disorder (HCC)    low platelets   Combined systolic and diastolic cardiac dysfunction    DDD (degenerative disc disease), cervical  Depression    Elevated PSA    Fracture of foot bone, right, closed 1964   GERD (gastroesophageal reflux disease)    Hammer toe    Hayfever    Hearing loss    Heart murmur    Hyperlipidemia    Neoplasm of uncertain behavior of skin    Pilonidal cyst with abscess    Polyneuropathy    Rheumatic fever    Seasonal allergies    Talipes cavus    Tinnitus     Social  History   Socioeconomic History   Marital status: Widowed    Spouse name: Not on file   Number of children: Not on file   Years of education: Not on file   Highest education level: Doctorate  Occupational History   Occupation: Retired  Tobacco Use   Smoking status: Former    Packs/day: 2.00    Years: 9.00    Total pack years: 18.00    Types: Cigarettes    Start date: 8    Quit date: 1969    Years since quitting: 54.9   Smokeless tobacco: Former  Scientific laboratory technician Use: Never used  Substance and Sexual Activity   Alcohol use: Yes    Comment: on occasion, maybe 3-4 times per month   Drug use: No   Sexual activity: Not on file  Other Topics Concern   Not on file  Social History Narrative   Lives alone   Right handed   Caffeine: 1-2 cups tea/day and 1 cup coffee some  days   Social Determinants of Health   Financial Resource Strain: Low Risk  (09/21/2021)   Overall Financial Resource Strain (CARDIA)    Difficulty of Paying Living Expenses: Not hard at all  Food Insecurity: No Food Insecurity (09/21/2021)   Hunger Vital Sign    Worried About Running Out of Food in the Last Year: Never true    [place] in the Last Year: Never true  Transportation Needs: No Transportation Needs (09/21/2021)   PRAPARE - Hydrologist (Medical): No    Lack of Transportation (Non-Medical): No  Physical Activity: Sufficiently Active (09/21/2021)   Exercise Vital Sign    Days of Exercise per Week: 5 days    Minutes of Exercise per Session: 30 min  Stress: Stress Concern Present (09/21/2021)   [place]    Feeling of Stress : To some extent  Social Connections: Socially Isolated (09/21/2021)   Social Connection and Isolation Panel [NHANES]    Frequency of Communication with Friends and Family: More than three times a week    Frequency of Social Gatherings with Friends and Family: More than  three times a week    Attends Religious Services: Never    Marine scientist or Organizations: No    Attends Archivist Meetings: Never    Marital Status: Widowed  Intimate Partner Violence: Not At Risk (08/22/2021)   Humiliation, Afraid, Rape, and Kick questionnaire    Fear of Current or Ex-Partner: No    Emotionally Abused: No    Physically Abused: No    Sexually Abused: No    Past Surgical History:  Procedure Laterality Date   COLONOSCOPY     2006 normal exam   INGUINAL HERNIA REPAIR     PILONIDAL CYST EXCISION  1966   WISDOM TOOTH EXTRACTION      Family History  Problem Relation Age  of Onset   Arthritis Mother    Breast cancer Mother    Arthritis Father    Heart disease Father    Stroke Father    Hypertension Father    Prostate cancer Father    Dementia Father        vascular    Stroke Paternal Grandfather    Stroke Maternal Grandmother    Dementia Paternal Aunt        vascular   Dementia Paternal Uncle        vascular   Colon cancer Neg Hx    Esophageal cancer Neg Hx    Stomach cancer Neg Hx    Rectal cancer Neg Hx     No Known Allergies  Current Outpatient Medications on File Prior to Visit  Medication Sig Dispense Refill   acetaminophen (TYLENOL) 325 MG tablet Take by mouth as needed.     Betaine, Trimethylglycine, (TMG, TRIMETHYLGLYCINE, PO) Take by mouth. Taking 2 capsules     cholecalciferol (VITAMIN D3) 25 MCG (1000 UNIT) tablet Take 1,000 Units by mouth daily.     ibuprofen (ADVIL) 200 MG tablet Take 200 mg by mouth as needed.     ketoconazole (NIZORAL) 2 % shampoo Apply 1 application topically as directed. PRN     Multiple Vitamins-Minerals (CENTRUM SILVER 50+MEN) TABS Take 1 tablet by mouth daily.     Naproxen Sodium (ALEVE PO) Take by mouth as needed.     triamcinolone cream (KENALOG) 0.1 % Apply 1 application topically daily as needed.     No current facility-administered medications on file prior to visit.    There were no  vitals taken for this visit.      Objective:   Physical Exam Vitals and nursing note reviewed.  Constitutional:      General: He is not in acute distress.    Appearance: Normal appearance. He is well-developed and normal weight.  HENT:     Head: Normocephalic and atraumatic.     Right Ear: Tympanic membrane, ear canal and external ear normal. There is no impacted cerumen.     Left Ear: Tympanic membrane, ear canal and external ear normal. There is no impacted cerumen.     Nose: Nose normal. No congestion or rhinorrhea.     Mouth/Throat:     Mouth: Mucous membranes are moist.     Pharynx: Oropharynx is clear. No oropharyngeal exudate or posterior oropharyngeal erythema.  Eyes:     General:        Right eye: No discharge.        Left eye: No discharge.     Extraocular Movements: Extraocular movements intact.     Conjunctiva/sclera: Conjunctivae normal.     Pupils: Pupils are equal, round, and reactive to light.  Neck:     Vascular: No carotid bruit.     Trachea: No tracheal deviation.  Cardiovascular:     Rate and Rhythm: Normal rate and regular rhythm.     Pulses: Normal pulses.     Heart sounds: Normal heart sounds. No murmur heard.    No friction rub. No gallop.  Pulmonary:     Effort: Pulmonary effort is normal. No respiratory distress.     Breath sounds: Normal breath sounds. No stridor. No wheezing, rhonchi or rales.  Chest:     Chest wall: No tenderness.  Abdominal:     General: Bowel sounds are normal. There is no distension.     Palpations: Abdomen is soft. There is no mass.  Tenderness: There is no abdominal tenderness. There is no right CVA tenderness, left CVA tenderness, guarding or rebound.     Hernia: No hernia is present.  Musculoskeletal:        General: No swelling, tenderness, deformity or signs of injury. Normal range of motion.     Right lower leg: No edema.     Left lower leg: No edema.  Lymphadenopathy:     Cervical: No cervical adenopathy.   Skin:    General: Skin is warm and dry.     Capillary Refill: Capillary refill takes less than 2 seconds.     Coloration: Skin is not jaundiced or pale.     Findings: No bruising, erythema, lesion or rash.  Neurological:     General: No focal deficit present.     Mental Status: He is alert and oriented to person, place, and time.     Cranial Nerves: No cranial nerve deficit.     Sensory: No sensory deficit.     Motor: No weakness.     Coordination: Coordination normal.     Gait: Gait normal.     Deep Tendon Reflexes: Reflexes normal.  Psychiatric:        Mood and Affect: Mood normal.        Behavior: Behavior normal.        Thought Content: Thought content normal.        Judgment: Judgment normal.       Assessment & Plan:  1. Routine general medical examination at a health care facility - Encouraged to get outside more - Eat healthy  - Follow up in one year or sooner if needed - CBC with Differential/Platelet; Future - Comprehensive metabolic panel; Future - Lipid panel; Future - TSH; Future - IBC + Ferritin; Future - IBC + Ferritin - TSH - Lipid panel - Comprehensive metabolic panel - CBC with Differential/Platelet  2. Vitamin D deficiency  - VITAMIN D 25 Hydroxy (Vit-D Deficiency, Fractures); Future - VITAMIN D 25 Hydroxy (Vit-D Deficiency, Fractures)  3. Benign prostatic hyperplasia with weak urinary stream  - PSA; Future - PSA  4. Chronic diastolic heart failure ([place]) - Per cardiology  - Euvolemic today  - CBC with Differential/Platelet; Future - Comprehensive metabolic panel; Future - Lipid panel; Future - TSH; Future - IBC + Ferritin; Future - IBC + Ferritin - TSH - Lipid panel - Comprehensive metabolic panel - CBC with Differential/Platelet  5. White matter disease - Follow up with Neurology as directed - CBC with Differential/Platelet; Future - Comprehensive metabolic panel; Future - Lipid panel; Future - TSH; Future - IBC + Ferritin;  Future - IBC + Ferritin - TSH - Lipid panel - Comprehensive metabolic panel - CBC with Differential/Platelet  6. Dysphagia, unspecified type - Does not want to be referred to GI at this time - Encouraged to eat sitting up   [name], NP

## 2022-03-07 NOTE — Telephone Encounter (Signed)
Please advise 

## 2022-03-07 NOTE — Telephone Encounter (Signed)
FYI

## 2022-03-07 NOTE — Patient Instructions (Addendum)
It was great seeing you today   We will follow up with you regarding your lab work   Please let me know if you need anything   

## 2022-03-08 ENCOUNTER — Encounter: Payer: Self-pay | Admitting: Adult Health

## 2022-03-08 NOTE — Telephone Encounter (Signed)
Please advise 

## 2022-04-05 ENCOUNTER — Telehealth: Payer: Self-pay

## 2022-04-05 NOTE — Telephone Encounter (Signed)
    1. I confirmed with the patient he is aware of his referral to the clinic 1/12 arriving @ 8:00 am    2. I discussed the format of the clinic and the physicians he will be seeing that day.   3. I discussed where the clinic is located and how to contact me.   4. I confirmed his address and informed him I would be mailing a packet of information and forms to be completed. I asked him to bring them with him the day of his appointment.    He voiced understanding of the above. I asked him to call me if he has any questions or concerns regarding his appointments or the forms he needs to complete.

## 2022-04-18 DIAGNOSIS — C61 Malignant neoplasm of prostate: Secondary | ICD-10-CM | POA: Diagnosis not present

## 2022-04-18 DIAGNOSIS — R972 Elevated prostate specific antigen [PSA]: Secondary | ICD-10-CM | POA: Diagnosis not present

## 2022-04-19 ENCOUNTER — Ambulatory Visit
Admission: RE | Admit: 2022-04-19 | Discharge: 2022-04-19 | Disposition: A | Discharge status: Home / Self Care | Payer: No Typology Code available for payment source | Source: Ambulatory Visit | Attending: Radiation Oncology | Admitting: Radiation Oncology

## 2022-04-19 ENCOUNTER — Encounter: Payer: Self-pay | Admitting: Genetic Counselor

## 2022-04-19 ENCOUNTER — Other Ambulatory Visit: Payer: Self-pay

## 2022-04-19 VITALS — BP 143/77 | HR 72 | Temp 96.5°F | Resp 18 | Ht 71.0 in | Wt 193.6 lb

## 2022-04-19 DIAGNOSIS — C61 Malignant neoplasm of prostate: Secondary | ICD-10-CM

## 2022-04-19 NOTE — Consult Note (Signed)
Multi-Disciplinary Clinic     04/19/2022   --------------------------------------------------------------------------------   [name]  MRN: [id number]  DOB: 09/30/1945, 77 year old Male  SSN: [id number]   PRIMARY CARE:  [name], NP  PRIMARY CARE FAX:  [phone]  REFERRING:  [name], NP  PROVIDER:  Wendie Simmer, M.D.  TREATING:  Raynelle Bring, M.D.  LOCATION:  [facility] [phone]     --------------------------------------------------------------------------------   CC/HPI: CC: Prostate Cancer   Physician requesting consult: Dr. 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 He has a paternal family history of prostate cancer and a mother who died in her 79s due to breast cancer. He did undergo genetic testing including BRCA-2 testing which was negative. He was found to have an elevated PSA of 6.4 prompting an MRI of the prostate on 02/19/22. This demonstrated a 1.1 cm right apical PI-RADS 4 lesion and a 1.0 cm right anterior transition zone mid prostatic PI-RADS 3 lesion. He then underwent an MR/US fusion biopsy of 03/21/22 that demonstrated Gleason 3+4=7 adenocarcinoma with 2 out of 12 systematic biopsies positive and 1 out of the 4 ROI-1 lesions positive. He has undergone genomic testing with the decipher genomic classifier which classified him as having low risk disease. His risk of adverse pathology at radical prostatectomy was estimated be 10%.   Family history: Father, mother died of breast cancer in her 16s.   Imaging studies: MRI (02/19/22): Negative for EPE, SVI, LAD, or bone lesions.   PMH: He has a history of hyperlipidemia and polyneuropathy.  PSH: Laparoscopic inguinal hernia repair.   TNM stage: cT1c N0 Mx  PSA: 6.4  Gleason score: 3+4=7 (GG 2)   Biopsy (03/21/22): 3/20 cores positive  Left: Benign  Right: R apex (30%, 3+4=7), R lateral apex (5%, 3+4=7)  MR targets: ROI -1: 1/4 cores positive (3+4=7, 5%), ROI-2: Benign  Prostate volume: 86.5 cc  PSAD: 0.07   Nomogram  OC disease: 66%  EPE: 33%  SVI: 2%  LNI: 2%  PFS (5 year, 10 year): 86%, 76%   Urinary function: IPSS is 14.  Erectile function: SHIM score is 2.     ALLERGIES: No Known Drug Allergies    MEDICATIONS: Tamsulosin Hcl 0.4 mg capsule 1 capsule PO Daily  Tamsulosin Hcl 0.4 mg capsule 1 capsule PO Daily  Aleve  Betaine  Centrum Silver  Sildenafil Citrate 100 mg tablet 1 tablet PO prn Take 1 tab po 1hr prior to sexual activities  Sildenafil Citrate 100 mg tablet 1 tablet PO prn Take 1 tab po 1hr prior to sexual activities  Vitamin D     GU PSH: Prostate Needle Biopsy - 03/21/2022       PSH Notes: wisdom teeth   NON-GU PSH: Drain Pilonidal Cyst Drain Pilonidal Cyst Hernia Repair Surgical Pathology, Gross And Microscopic Examination For Prostate Needle - 03/21/2022     GU PMH: Dysuria - 04/04/2022 Prostate Cancer - 04/04/2022, - 03/27/2022 Urinary Hesitancy - 04/04/2022, - 01/16/2022 Elevated PSA - 03/21/2022, - 03/13/2022, - 01/16/2022 Encounter for Prostate Cancer screening - 03/13/2022, - 01/16/2022 BPH w/LUTS - 01/16/2022 ED due to arterial insufficiency - 01/16/2022, - 2018 Weak Urinary Stream - 01/16/2022 Pelvic/perineal pain - 2018 Urinary Frequency - 2018    NON-GU PMH: Arthritis Atrial Fibrillation Cardiac murmur, unspecified  Heart disease, unspecified Hypercholesterolemia Myocardial Infarction Other heart failure    FAMILY HISTORY: 1 Daughter - Daughter 3 Son's - Son Arthritis - Grandmother Breast Cancer - Mother Death In The Family Father - Father Death In The Family Mother - Mother Dementia - Father, Runs in Family Diabetes - Grandmother Prostate Cancer - Father stroke - Grandfather, Geophysicist/field seismologist, Father   SOCIAL  HISTORY: Marital Status: Widowed Preferred Language: English; Ethnicity: Not Hispanic Or Latino; Race: White Current Smoking Status: Patient does not smoke anymore. Has not smoked since 09/07/1966.   Tobacco Use Assessment Completed: Used Tobacco in last 30 days? Does drink.  Drinks 1 caffeinated drink per day.    REVIEW OF SYSTEMS:    GU Review Male:   Patient denies frequent urination, hard to postpone urination, burning/ pain with urination, get up at night to urinate, leakage of urine, stream starts and stops, trouble starting your streams, and have to strain to urinate .  Gastrointestinal (Upper):   Patient denies nausea and vomiting.  Gastrointestinal (Lower):   Patient denies diarrhea and constipation.  Constitutional:   Patient denies fever, night sweats, weight loss, and fatigue.  Skin:   Patient denies skin rash/ lesion and itching.  Eyes:   Patient denies blurred vision and double vision.  Ears/ Nose/ Throat:   Patient denies sore throat and sinus problems.  Hematologic/Lymphatic:   Patient denies easy bruising and swollen glands.  Cardiovascular:   Patient denies leg swelling and chest pains.  Respiratory:   Patient denies cough and shortness of breath.  Endocrine:   Patient denies excessive thirst.  Musculoskeletal:   Patient denies back pain and joint pain.  Neurological:   Patient denies headaches and dizziness.  Psychologic:   Patient denies depression and anxiety.   VITAL SIGNS: None   MULTI-SYSTEM PHYSICAL EXAMINATION:    Constitutional: Well-nourished. No physical deformities. Normally developed. Good grooming.     Complexity of Data:  Lab Test Review:   PSA  Records Review:   Pathology Reports, Previous Patient Records  X-Ray Review: MRI Prostate GSORAD: Reviewed Films.     09/17/16  PSA  Total PSA 2.63 ng/dl    PROCEDURES: None   ASSESSMENT:      ICD-10 Details  1 GU:   Prostate Cancer - C61    PLAN:           Document Letter(s):  Created for  Patient: Clinical Summary         Notes:   1. Favorable intermediate risk prostate cancer: I had a very detailed discussion with [name] today regarding his prostate cancer situation and options for management/treatment. We did discuss the natural history of favorable intermediate risk prostate cancer and I explained that this may be clinically insignificant cancer considering his age and life expectancy. We reviewed his information which is quite consistent including his MRI, biopsy results, and his decipher genomic testing results. Although he statistically may have a clinically insignificant cancer, he appears very reluctant to consider active surveillance after we discussed that option is a very reasonable option for him.   The patient was counseled about the natural history of prostate cancer and the standard treatment options that are available for prostate cancer. It was explained to him how his age and life expectancy, clinical stage, Gleason score/prognostic grade group, and PSA (and PSA density) affect his prognosis, the decision to proceed with additional staging studies, as well as how that information influences recommended treatment strategies. We discussed the roles for active surveillance,  radiation therapy, surgical therapy, androgen deprivation, as well as ablative therapy and other investigational options for the treatment of prostate cancer as appropriate to his individual cancer situation. We discussed the risks and benefits of these options with regard to their impact on cancer control and also in terms of potential adverse events, complications, and impact on quality of life particularly related to urinary and sexual function. The patient was encouraged to ask questions throughout the discussion today and all questions were answered to his stated satisfaction. In addition, the patient was provided with and/or directed to appropriate resources and literature for further education about  prostate cancer and treatment options.   Ultimately, he continues to be reluctant to consider active surveillance and did have some interest in possible focal ablative therapy. This may be a very reasonable treatment option for him considering his situation and his age/life expectancy if he is not interested in active surveillance. As such, he will be referred to [facility] to see Dr. Adline Mango to consider this option. We also discussed radiotherapy is a reasonable treatment option for him. I did not recommend surgical therapy considering his low risk for harboring clinically significant disease as well as the significant risk of incontinence in his late 27s. All questions were answered to his stated satisfaction.   CC: [name], NP  Dr. Alen Blew  Dr. Tyler Pita    E & M CODES: We spent 68 minutes dedicated to evaluation and management time, including face to face interaction, discussions on coordination of care, documentation, result review, and discussion with others as applicable.

## 2022-04-19 NOTE — Progress Notes (Signed)
Radiation Oncology         [phone] ________________________________  Multidisciplinary Prostate Cancer Clinic  Initial Radiation Oncology Consultation  Name: Marc Thornton MRN: [id number]  Date: 04/19/2022  DOB: Aug 30, 1945  HU:[id number], Tommi Rumps, NP  Lucas Mallow, MD   REFERRING PHYSICIAN: Lucas Mallow, MD  DIAGNOSIS: 77 y.o. gentleman with stage T1c adenocarcinoma of the prostate with a Gleason's score of 3+4 and a PSA of 6.4    ICD-10-CM   1. Malignant neoplasm of prostate ([place])  C61       HISTORY OF PRESENT ILLNESS::Marc Thornton is a 77 y.o. gentleman.  He was noted to have an elevated PSA of 6.4 by his primary care physician, [name], NP.  Accordingly, he was referred for evaluation in urology by Dr. Gloriann Loan on 01/16/22,  digital rectal examination performed at that time was normal. He underwent prostate MRI on 02/19/22 showing: PI-RADS 4 lesion of right posterolateral peripheral zone in apex; PI-RADS 3 lesion in right anterior transition zone in mid gland. The patient proceeded to MRI fusion biopsy of the prostate on 03/21/22.  The prostate volume measured 86.52 cc.  Out of 20 core biopsies, 3 were positive.  The maximum Gleason score was 3+4, and this was seen in one sample from ROI #1 (small focus), right apex, and right apex lateral (small focus).      The patient reviewed the biopsy results with his urologist and he has kindly been referred today to the multidisciplinary prostate cancer clinic for presentation of pathology and radiology studies in our conference for discussion of potential radiation treatment options and clinical evaluation.  PREVIOUS RADIATION THERAPY: No  PAST MEDICAL HISTORY:  has a past medical history of Abnormality of gait, Arthritis, Atrial fibrillation ([place]), BPH (benign prostatic hyperplasia), Cardiomyopathy ([place]), Cervical disc disorder, Chicken pox, Closed fracture of one or more phalanges of foot, Clotting disorder ([place]), Combined  systolic and diastolic cardiac dysfunction, DDD (degenerative disc disease), cervical, Depression, Elevated PSA, Fracture of foot bone, right, closed (1964), GERD (gastroesophageal reflux disease), Hammer toe, Hayfever, Hearing loss, Heart murmur, Hyperlipidemia, Neoplasm of uncertain behavior of skin, Pilonidal cyst with abscess, Polyneuropathy, Rheumatic fever, Seasonal allergies, Talipes cavus, and Tinnitus.    PAST SURGICAL HISTORY: Past Surgical History:  Procedure Laterality Date   COLONOSCOPY     2006 normal exam   INGUINAL HERNIA REPAIR     PILONIDAL CYST EXCISION  1966   WISDOM TOOTH EXTRACTION      FAMILY HISTORY: family history includes Arthritis in his father and mother; Breast cancer in his mother; Dementia in his father, paternal aunt, and paternal uncle; Heart disease in his father; Hypertension in his father; Prostate cancer in his father; Stroke in his father, maternal grandmother, and paternal grandfather.  SOCIAL HISTORY:  reports that he quit smoking about 55 years ago. His smoking use included cigarettes. He started smoking about 64 years ago. He has a 18.00 pack-year smoking history. He has quit using smokeless tobacco. He reports current alcohol use. He reports that he does not use drugs.  ALLERGIES: Patient has no known allergies.  MEDICATIONS:  Current Outpatient Medications  Medication Sig Dispense Refill   acetaminophen (TYLENOL) 325 MG tablet Take by mouth as needed.     Betaine, Trimethylglycine, (TMG, TRIMETHYLGLYCINE, PO) Take by mouth. Taking 2 capsules     cholecalciferol (VITAMIN D3) 25 MCG (1000 UNIT) tablet Take 1,000 Units by mouth daily.     ibuprofen (ADVIL) 200 MG tablet Take  200 mg by mouth as needed.     ketoconazole (NIZORAL) 2 % shampoo Apply 1 application topically as directed. PRN     Multiple Vitamins-Minerals (CENTRUM SILVER 50+MEN) TABS Take 1 tablet by mouth daily.     Naproxen Sodium (ALEVE PO) Take by mouth as needed.     triamcinolone  cream (KENALOG) 0.1 % Apply 1 application topically daily as needed.     No current facility-administered medications for this encounter.    REVIEW OF SYSTEMS:  On review of systems, the patient reports that he is doing well overall. He denies any chest pain, shortness of breath, cough, fevers, chills, night sweats, unintended weight changes. He denies any bowel disturbances, and denies abdominal pain, nausea or vomiting. He denies any new musculoskeletal or joint aches or pains. His IPSS was 14, indicating moderate urinary symptoms. His SHIM was 2, indicating he has severe erectile dysfunction. A complete review of systems is obtained and is otherwise negative.   PHYSICAL EXAM:  Wt Readings from Last 3 Encounters:  03/07/22 186 lb (84.4 kg)  01/16/22 187 lb 9.6 oz (85.1 kg)  09/21/21 191 lb (86.6 kg)   Temp Readings from Last 3 Encounters:  03/07/22 97.8 F (36.6 C) (Oral)  09/21/21 98 F (36.7 C)  08/22/21 97.8 F (36.6 C) (Oral)   BP Readings from Last 3 Encounters:  03/07/22 110/70  01/16/22 122/86  09/21/21 120/70   Pulse Readings from Last 3 Encounters:  03/07/22 64  01/16/22 67  09/21/21 (!) 59    /10  In general this is a well appearing Caucasian man in no acute distress. He's alert and oriented x4 and appropriate throughout the examination. Cardiopulmonary assessment is negative for acute distress and he exhibits normal effort.    KPS = 100  100 - Normal; no complaints; no evidence of disease. 90   - Able to carry on normal activity; minor signs or symptoms of disease. 80   - Normal activity with effort; some signs or symptoms of disease. 44   - Cares for self; unable to carry on normal activity or to do active work. 60   - Requires occasional assistance, but is able to care for most of his personal needs. 50   - Requires considerable assistance and frequent medical care. 79   - Disabled; requires special care and assistance. 17   - Severely disabled; hospital  admission is indicated although death not imminent. 92   - Very sick; hospital admission necessary; active supportive treatment necessary. 10   - Moribund; fatal processes progressing rapidly. 0     - Dead  Karnofsky DA, Abelmann [place], Craver LS and Burchenal [facility] [phone]) The use of the nitrogen mustards in the palliative treatment of carcinoma: with particular reference to bronchogenic carcinoma Cancer 1 634-56   LABORATORY DATA:  Lab Results  Component Value Date   WBC 6.8 03/07/2022   HGB 15.4 03/07/2022   HCT 45.8 03/07/2022   MCV 94.6 03/07/2022   PLT 153.0 03/07/2022   Lab Results  Component Value Date   NA 140 03/07/2022   K 4.1 03/07/2022   CL 103 03/07/2022   CO2 29 03/07/2022   Lab Results  Component Value Date   ALT 19 03/07/2022   AST 30 03/07/2022   ALKPHOS 55 03/07/2022   BILITOT 1.0 03/07/2022     RADIOGRAPHY: No results found.    IMPRESSION/PLAN: 77 y.o. gentleman with Stage T1c adenocarcinoma of the prostate with a Gleason score of 3+4 and  a PSA of 6.4.    We discussed the patient's workup and outlined the nature of prostate cancer in this setting. The patient's T stage, Gleason's score, and PSA put him into the favorable intermediate risk group. Accordingly, he is eligible for a variety of potential treatment options including 5.5 weeks of external radiation, or prostatectomy. We discussed the available radiation techniques, and focused on the details and logistics of delivery. The patient is not an ideal candidate for brachytherapy with a prostate volume of 86 cc and is not an ideal surgical candidate given his advanced age but he will also meet with Dr. Alinda Money in clinic today to further discuss his opinion regarding candidacy for robotic prostatectomy.  Therefore, we discussed and outlined the risks, benefits, short and long-term effects associated with external beam radiotherapy and compared and contrasted these with prostatectomy. We discussed the role of SpaceOAR  gel in reducing the rectal toxicity associated with radiotherapy. He appears to have a good understanding of his disease and our treatment recommendations which are of curative intent.  He was encouraged to ask questions that were answered to his stated satisfaction.  At the end of the conversation the patient is undecided regarding his treatment reference with either robotic prostatectomy versus 5-1/2 weeks of daily prostate IMRT.  He plans to take some additional time to consider his options and has our contact information to let us know once he reaches a final decision so that we can proceed with treatment planning accordingly at that time.  We enjoyed meeting him and look forward to continuing to participate in his care.   We personally spent 60 minutes in this encounter including chart review, reviewing radiological studies, meeting face-to-face with the patient, entering orders and completing documentation.    Nicholos Johns, PA-C    Tyler Pita, MD  Home Oncology Direct Dial: [phone]  Fax: [phone] [email or web address]  Skype  [email or web address]   This document serves as a record of services personally performed by Tyler Pita, MD and Freeman Caldron, PA-C. It was created on their behalf by Wilburn Mylar, a trained medical scribe. The creation of this record is based on the scribe's personal observations and the provider's statements to them. This document has been checked and approved by the attending provider.

## 2022-04-19 NOTE — Progress Notes (Addendum)
Care Plan Summary  Name: Marc Thornton DOB: [birth year]   Your Medical Team:   Urologist -  Dr. Raynelle Bring, [facility]  Radiation Oncologist - Dr. Tyler Pita, [place]     Recommendations: 1) Radiation   2) Focal Therapy    * These recommendations are based on information available as of today's consult.      Recommendations may change depending on the results of further tests or exams.    Next Steps: 1) Take time to consider your options.  [name], your prostate navigator will contact you to finalize decision and assist with next steps.   2) Referral will be placed for consult regarding focal therapy.    When appointments need to be scheduled, you will be contacted by [facility] and/or [facility].  Questions?  Please do not hesitate to call [name], BSN, RN at [phone] with any questions or concerns.  [name] is your Oncology Nurse Navigator and is available to assist you while you're receiving your medical care at [facility].

## 2022-04-23 ENCOUNTER — Other Ambulatory Visit: Payer: Self-pay

## 2022-04-23 ENCOUNTER — Telehealth: Payer: Self-pay | Admitting: Genetic Counselor

## 2022-04-23 ENCOUNTER — Encounter: Payer: Self-pay | Admitting: Genetic Counselor

## 2022-04-23 DIAGNOSIS — C61 Malignant neoplasm of prostate: Secondary | ICD-10-CM

## 2022-04-23 NOTE — Progress Notes (Signed)
Patient presented to the [facility] on 04/19/2022 for his stage T1c adenocarcinoma of the prostate with a Gleason's score of 3+4 and a PSA of 6.4.   Patient remained undecided with his treatment decision, but did request to have a referral placed to Dr. Thomos Lemons to learn more about focal therapy.   RN faxed referral successfully to Dr. Christia Reading office at [phone].   RN will follow up to ensure consult date is set.

## 2022-04-23 NOTE — Telephone Encounter (Signed)
[name] was seen by a genetic counselor during the prostate cancer multidisciplinary clinic on 04/19/2022. [name] had prior direct to consumer genetic testing through [employer] in November 2023. He tested negative for all hereditary cancer genes on [employer]'s Genetic Health Screen Panel. This panel included the following prostate cancer genes: ATM, BRCA1, BRCA2, BRIP1, CHEK2, EPCAM, HOXB13, MLH1, MSH2, MSH6, PALB2, PMS2, and TP53.  We discussed the option to pursue updated genetic testing that will include RNA analysis. Additionally, [employer]'s Genetic Health Screen Panel does not report variants of uncertain significance. [name] declined updated genetic testing at this time. We encouraged [name] to remain in contact with [place] so that we can inform him of any changes in cancer genetics and testing that may be of benefit for this family.   The test report has been scanned into EPIC and is located under the Molecular Pathology section of the Results Review tab.  A portion of the result report is included below for reference.   Lucille Passy, MS, [employer] Genetic Counselor [email or web address] (P) [phone]

## 2022-04-26 NOTE — Progress Notes (Signed)
RN collaborating with Glynn Octave, community care nurse navigator @ [place], to obtain authorization for patient to have referral to Dr. Thomos Lemons @ [place].  NPI and address left on voicemail as requested.    RN updated patient, he is aware that we are awaiting VA approval for referral.  No further needs at this time.   Will continue to follow.

## 2022-04-30 DIAGNOSIS — R4181 Age-related cognitive decline: Secondary | ICD-10-CM | POA: Diagnosis not present

## 2022-05-02 NOTE — Progress Notes (Signed)
Received notification from the [facility] that authorization has been approved for patient to have referral to Dr. Thomos Lemons @ [place].   RN spoke with [place] @ [facility] to update that the authorization packet will be faxed.  Missy notified that patient will be contacted once packet is received to have appointment scheduled.   RN updated patient.  Verbalized appreciation and understanding.  No further needs at this time.  Will to continue to follow to ensure care coordination.

## 2022-05-07 NOTE — Progress Notes (Signed)
Patient is scheduled for consult with Dr. Thomos Lemons @ [place] on 06/03/2022 @ 2pm.   Patient is aware of this appointment, and agreeable with RN to follow up after consult.    No further needs at this time.  RN encouraged patient to call with any questions that may arise before consult.

## 2022-05-09 ENCOUNTER — Encounter: Payer: No Typology Code available for payment source | Admitting: Genetic Counselor

## 2022-05-09 ENCOUNTER — Other Ambulatory Visit: Payer: No Typology Code available for payment source

## 2022-05-10 DIAGNOSIS — C61 Malignant neoplasm of prostate: Secondary | ICD-10-CM | POA: Diagnosis not present

## 2022-05-10 DIAGNOSIS — R972 Elevated prostate specific antigen [PSA]: Secondary | ICD-10-CM | POA: Diagnosis not present

## 2022-05-14 DIAGNOSIS — C61 Malignant neoplasm of prostate: Secondary | ICD-10-CM | POA: Diagnosis not present

## 2022-05-18 ENCOUNTER — Encounter: Payer: Self-pay | Admitting: Adult Health

## 2022-06-03 DIAGNOSIS — C61 Malignant neoplasm of prostate: Secondary | ICD-10-CM | POA: Diagnosis not present

## 2022-06-06 ENCOUNTER — Telehealth: Payer: Self-pay | Admitting: Cardiovascular Disease

## 2022-06-06 NOTE — Progress Notes (Signed)
RN spoke with patient to assess any additional needs since his consult with Dr. Thomos Lemons on 2/26.   Patient remains undecided at this time regarding his treatment decision, and feels he will need several weeks to review additional information received from providers.    RN encouraged patient to call with any questions and he is agreeable to me to follow up in several weeks.

## 2022-06-06 NOTE — Telephone Encounter (Signed)
Patient is requesting to speak with Dr. Satira Mccallum regarding stress test completed on 02/28/16. He states Dr. Acie Fredrickson read his stress test and he has a few questions.

## 2022-06-07 ENCOUNTER — Telehealth: Payer: Self-pay | Admitting: Cardiovascular Disease

## 2022-06-07 NOTE — Telephone Encounter (Signed)
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 He is a [facility] patient, but is calling us because the interpretor listed on the test results is Dr Acie Fredrickson. He is requesting to speak directly to Dr Acie Fredrickson to make clear the below findings. He states these numbers being anything above a 3, especially a 4, indicates there is perfusion impairment, but the test was resulted as being "normal, low risk." He states this can't be so with these numbers and also insists that this information should have been supplied to him 7 years ago, not by happenstance with his research. Says he did just recently have a cardiac MRI if the MD would like to look at it, but he wants a call back further explaining these results from 2017. Advised that MD is out of office until Tuesday 06/11/22 and would be in clinic that day, but I would ask him to contact the patient at first available opportunity. Routing to MD for review.  SSS 4      SRS 0      SDS 4

## 2022-06-07 NOTE — Telephone Encounter (Signed)
Patient states he was talking to the nurse but got disconnected. Please advise

## 2022-06-08 NOTE — Telephone Encounter (Signed)
Wow is right. I can call and explain. I have many lengthy talks whenever I see him.

## 2022-06-22 ENCOUNTER — Encounter: Payer: Self-pay | Admitting: Cardiovascular Disease

## 2022-06-22 DIAGNOSIS — R072 Precordial pain: Secondary | ICD-10-CM

## 2022-06-25 ENCOUNTER — Encounter: Payer: Self-pay | Admitting: Cardiovascular Disease

## 2022-06-25 NOTE — Telephone Encounter (Signed)
Spoke with the patient about the provider's message and the order for Cardiac PET. Pt verbalized understanding. Will call or message with any questions/concerns.

## 2022-07-08 NOTE — Progress Notes (Signed)
RN attempted to contact patient to follow up regarding treatment decision.   Voicemail box has not been set up at this time.   RN will continue to follow up.

## 2022-07-12 NOTE — Progress Notes (Signed)
RN spoke with patient to follow up since [facility] on 04/19/2022.   Patient still remains undecided at this time but voiced he is leaning towards focal therapy with Dr. Harold Barban.    RN encouraged patient to call with any questions or concerns, and is agreeable for me to follow up next month to ensure care coordination for his treatment decision.

## 2022-07-26 LAB — LAB REPORT - SCANNED
A1c: 5.4
EGFR: 74

## 2022-07-30 ENCOUNTER — Telehealth (HOSPITAL_COMMUNITY): Payer: Self-pay | Admitting: Emergency Medicine

## 2022-07-30 NOTE — Telephone Encounter (Signed)
Reaching out to patient to offer assistance regarding upcoming cardiac imaging study; pt verbalizes understanding of appt date/time, parking situation and where to check in, pre-test NPO status and medications ordered, and verified current allergies; name and call back number provided for further questions should they arise Haizel Gatchell RN Navigator Cardiac Imaging [place] [facility] [phone] office [phone] cell 

## 2022-07-31 ENCOUNTER — Ambulatory Visit (HOSPITAL_COMMUNITY)
Admission: RE | Admit: 2022-07-31 | Discharge: 2022-07-31 | Disposition: A | Discharge status: Home / Self Care | Payer: No Typology Code available for payment source | Source: Ambulatory Visit | Attending: Cardiovascular Disease | Admitting: Cardiovascular Disease

## 2022-07-31 DIAGNOSIS — R072 Precordial pain: Secondary | ICD-10-CM | POA: Diagnosis not present

## 2022-07-31 LAB — NM PET CT CARDIAC PERFUSION MULTI W/ABSOLUTE BLOODFLOW
MBFR: 1.47
Nuc Rest EF: 50 %
Nuc Stress EF: 52 %
Rest MBF: 0.85 ml/g/min
Rest Nuclear Isotope Dose: 22.1 mCi
ST Depression (mm): 0 mm
Stress MBF: 1.25 ml/g/min
Stress Nuclear Isotope Dose: 22.3 mCi
TID: 1.19

## 2022-07-31 MED ORDER — REGADENOSON 0.4 MG/5ML IV SOLN
INTRAVENOUS | Status: AC
  Filled 2022-07-31: qty 5

## 2022-07-31 MED ORDER — REGADENOSON 0.4 MG/5ML IV SOLN
0.4000 mg | Freq: Once | INTRAVENOUS | Status: AC
  Administered 2022-07-31: 0.4 mg via INTRAVENOUS

## 2022-07-31 MED ORDER — RUBIDIUM RB82 GENERATOR (RUBYFILL)
20.0000 | PACK | Freq: Once | INTRAVENOUS | Status: AC
  Administered 2022-07-31: 22.33 via INTRAVENOUS

## 2022-07-31 MED ORDER — RUBIDIUM RB82 GENERATOR (RUBYFILL)
20.0000 | PACK | Freq: Once | INTRAVENOUS | Status: AC
  Administered 2022-07-31: 22.12 via INTRAVENOUS

## 2022-08-01 ENCOUNTER — Encounter: Payer: Self-pay | Admitting: Cardiovascular Disease

## 2022-08-01 DIAGNOSIS — R072 Precordial pain: Secondary | ICD-10-CM

## 2022-08-01 DIAGNOSIS — I422 Other hypertrophic cardiomyopathy: Secondary | ICD-10-CM

## 2022-08-02 ENCOUNTER — Other Ambulatory Visit: Payer: Self-pay

## 2022-08-02 DIAGNOSIS — Z01812 Encounter for preprocedural laboratory examination: Secondary | ICD-10-CM

## 2022-08-02 DIAGNOSIS — I5032 Chronic diastolic (congestive) heart failure: Secondary | ICD-10-CM

## 2022-08-02 DIAGNOSIS — I422 Other hypertrophic cardiomyopathy: Secondary | ICD-10-CM

## 2022-08-02 MED ORDER — METOPROLOL TARTRATE 100 MG PO TABS: ORAL_TABLET | ORAL | 0 refills | Status: DC

## 2022-08-02 NOTE — Telephone Encounter (Signed)
   Your cardiac CT will be scheduled at one of the below locations:   [facility] [address] [place], [place] [address] [phone]  OR  [facility] [address] [place], [place] [address] [phone]  OR   [facility] [address] [place], [place] [address] [phone]  If scheduled at [facility], please arrive at the [facility] Children's Entrance (Entrance C2) of [facility] 30 minutes prior to test start time. You can use the FREE valet parking offered at entrance C (encouraged to control the heart rate for the test)  Proceed to the [facility] Radiology Department (first floor) to check-in and test prep.  All radiology patients and guests should use entrance C2 at [facility], accessed from [facility], even though the hospital's physical address listed is [address]    If scheduled at [facility] or [facility], please arrive 15 mins early for check-in and test prep.   Please follow these instructions carefully (unless otherwise directed):  Hold all erectile dysfunction medications at least 3 days (72 hrs) prior to test. (Ie viagra, cialis, sildenafil, tadalafil, etc) We will administer nitroglycerin during this exam.   On the Night Before the Test: Be sure to Drink plenty of water. Do not consume any caffeinated/decaffeinated beverages or chocolate 12 hours prior to your test. Do not take any antihistamines 12 hours prior to your test.   On the Day of the Test: Drink plenty of water until 1 hour prior to the test. Do not eat any food 1 hour prior to test. You may take your regular medications prior to the test.  Take metoprolol 100 mg two hours prior to test. If you take Furosemide/Hydrochlorothiazide/Spironolactone, please HOLD on the morning of the test.   After the  Test: Drink plenty of water. After receiving IV contrast, you may experience a mild flushed feeling. This is normal. On occasion, you may experience a mild rash up to 24 hours after the test. This is not dangerous. If this occurs, you can take Benadryl 25 mg and increase your fluid intake. If you experience trouble breathing, this can be serious. If it is severe call 911 IMMEDIATELY. If it is mild, please call our office.   We will call to schedule your test 2-4 weeks out understanding that some insurance companies will need an authorization prior to the service being performed.   For non-scheduling related questions, please contact the cardiac imaging nurse navigator should you have any questions/concerns: Rockwell [place], Cardiac Imaging Nurse Navigator Larey Brick, Cardiac Imaging Nurse Navigator [facility] Direct Office Dial: [phone]   For scheduling needs, including cancellations and rescheduling, please call [place], [phone].

## 2022-08-02 NOTE — Telephone Encounter (Signed)
Should complete workup with a coronary CT angio. Please go ahead and schedule that.

## 2022-08-06 DIAGNOSIS — C61 Malignant neoplasm of prostate: Secondary | ICD-10-CM | POA: Diagnosis not present

## 2022-08-07 ENCOUNTER — Encounter: Payer: Self-pay | Admitting: Adult Health

## 2022-08-07 NOTE — Telephone Encounter (Signed)
FYI

## 2022-08-07 NOTE — Telephone Encounter (Signed)
Pt is scheduled for a CPE in December 2024.  Pt was advised to fast for 8 hours prior to visit.  Pt was also informed that he could drink plenty of water and he could have black coffee, with no sugar or cream.  Pt is asking if he can have black tea??  Please advise.

## 2022-08-09 ENCOUNTER — Telehealth: Payer: Self-pay | Admitting: Cardiovascular Disease

## 2022-08-09 ENCOUNTER — Telehealth (HOSPITAL_COMMUNITY): Payer: Self-pay | Admitting: *Deleted

## 2022-08-09 NOTE — Telephone Encounter (Signed)
Reaching out to patient to offer assistance regarding upcoming cardiac imaging study; pt verbalizes understanding of appt date/time, parking situation and where to check in, pre-test NPO status and medications ordered, and verified current allergies; name and call back number provided for further questions should they arise  Larey Brick RN Navigator Cardiac Imaging [facility] [phone] office [phone] cell  Patient to take 50mg  metoprolol tartrate two hours prior if HR is greater than 65bpm. He is aware to at 12pm.

## 2022-08-09 NOTE — Telephone Encounter (Signed)
Called patient voice has not set up yet unable to leave message   This message has been routed to Pre- cert  heart care team

## 2022-08-09 NOTE — Telephone Encounter (Signed)
Patient stated Marc Thornton Pre-op told him he will need authorization requested from [place] to have CT CARDIAC [facility] test done on 5/6.

## 2022-08-12 ENCOUNTER — Telehealth: Payer: Self-pay | Admitting: Adult Health

## 2022-08-12 ENCOUNTER — Ambulatory Visit (HOSPITAL_COMMUNITY): Admission: RE | Admit: 2022-08-12 | Payer: Medicare PPO | Source: Ambulatory Visit

## 2022-08-12 NOTE — Telephone Encounter (Signed)
Contacted Burnis Kingfisher to schedule their annual wellness visit. Appointment made for 08/21/22.  Rudell Cobb AWV direct phone # [phone]   Due to schedule change awv 5/14 and been r/s to 08/21/22  Pt aware also sent my chart message

## 2022-08-16 NOTE — Telephone Encounter (Signed)
  Good morning everyone,  I have contacted this pt by phone and advised the pt that he will need to contact his [facility] and speak to primary provider at that location to get his paperwork started to setup a VA Referral for [facility].  Thanks, Jonette Pesa

## 2022-08-19 ENCOUNTER — Telehealth (HOSPITAL_COMMUNITY): Payer: Self-pay | Admitting: Emergency Medicine

## 2022-08-19 NOTE — Telephone Encounter (Signed)
Reaching out to patient to offer assistance regarding upcoming cardiac imaging study; pt verbalizes understanding of appt date/time, parking situation and where to check in, pre-test NPO status and medications ordered, and verified current allergies; name and call back number provided for further questions should they arise Giavana Rooke RN Navigator Cardiac Imaging [place] [facility] [phone] office [phone] cell 

## 2022-08-20 ENCOUNTER — Encounter: Payer: Self-pay | Admitting: Cardiovascular Disease

## 2022-08-20 ENCOUNTER — Telehealth: Payer: Self-pay | Admitting: Cardiovascular Disease

## 2022-08-20 ENCOUNTER — Encounter: Payer: Self-pay | Admitting: Adult Health

## 2022-08-20 ENCOUNTER — Ambulatory Visit (HOSPITAL_COMMUNITY)
Admission: RE | Admit: 2022-08-20 | Discharge: 2022-08-20 | Disposition: A | Discharge status: Home / Self Care | Payer: No Typology Code available for payment source | Source: Ambulatory Visit | Attending: Cardiovascular Disease | Admitting: Cardiovascular Disease

## 2022-08-20 DIAGNOSIS — R072 Precordial pain: Secondary | ICD-10-CM | POA: Diagnosis not present

## 2022-08-20 DIAGNOSIS — I422 Other hypertrophic cardiomyopathy: Secondary | ICD-10-CM | POA: Diagnosis not present

## 2022-08-20 LAB — BASIC METABOLIC PANEL
BUN/Creatinine Ratio: 25 — ABNORMAL HIGH (ref 10–24)
BUN: 27 mg/dL (ref 8–27)
CO2: 24 mmol/L (ref 20–29)
Calcium: 9.9 mg/dL (ref 8.6–10.2)
Chloride: 103 mmol/L (ref 96–106)
Creatinine, Ser: 1.07 mg/dL (ref 0.76–1.27)
Glucose: 84 mg/dL (ref 70–99)
Potassium: 4.9 mmol/L (ref 3.5–5.2)
Sodium: 141 mmol/L (ref 134–144)
eGFR: 71 mL/min/{1.73_m2} (ref >59)

## 2022-08-20 MED ORDER — NITROGLYCERIN 0.4 MG SL SUBL
SUBLINGUAL_TABLET | SUBLINGUAL | Status: AC
  Filled 2022-08-20: qty 2

## 2022-08-20 MED ORDER — NITROGLYCERIN 0.4 MG SL SUBL
0.8000 mg | SUBLINGUAL_TABLET | Freq: Once | SUBLINGUAL | Status: AC
  Administered 2022-08-20: 0.8 mg via SUBLINGUAL

## 2022-08-20 MED ORDER — IOHEXOL 350 MG/ML SOLN
95.0000 mL | Freq: Once | INTRAVENOUS | Status: AC | PRN
  Administered 2022-08-20: 95 mL via INTRAVENOUS

## 2022-08-20 NOTE — Telephone Encounter (Signed)
He had a CT angio today- did not even take the metoprolol prior to procedure due to hr being under 60.   He was just given contrast and NTG for the CT around 1230. Thought that the NTG would have worn off by now. He reports a low hr.  His heartrate does increase with activity; went up to 100 when up to bathroom; but at rest it is around 48-50. He reports drinking plenty of water and drank a little tea as well.   O2 sat is 98%, no sob, no dizziness or blurred vision  He just wanted to check on his hr after the procedure- that is why he checked it, did not check it due to any s/s.   Reassured him that it is a good sign that he is not having any s/s and that his hr increases with activity. I will send info to Dr Royann Shivers and get back to pt

## 2022-08-20 NOTE — Telephone Encounter (Signed)
If he is not symptomatic, the slow heart rate does not require any further evaluation or treatment.  The nitroglycerin wears off in 10 minutes or less and has no impact on the heart rate. The CT was good news.  There are no significant blockages in the large coronary arteries. The abnormalities on the PET scan are due to what Marc Thornton likes to call "small vessel disease".  He is correct. He does not need heart catheterization, angioplasty/stent or bypass surgery.

## 2022-08-20 NOTE — Telephone Encounter (Signed)
STAT if HR is under 50 or over 120 (normal HR is 60-100 beats per minute)  What is your heart rate?   Do you have a log of your heart rate readings (document readings)?   107/66  HR 45 108/67  HR 67 112/70  HR 47 119/79  HR 47  Do you have any other symptoms?    Patient is concerned about his low heart rate and blood pressure after having procedure.

## 2022-08-20 NOTE — Telephone Encounter (Signed)
Gave the patient the information from Dr [employer]. He verbalized understanding.   The pt wants to get conditioned as quickly as possible. Heard that the quickest way is HIIT So, he wanted to know: On an interval basis, how high should/could he get his heart rate And for how long can he keep it at that high rate?  Or do you want to send him to "[facility]" "Cardiac Rehab," to see what his parameters should be?

## 2022-08-21 ENCOUNTER — Ambulatory Visit (INDEPENDENT_AMBULATORY_CARE_PROVIDER_SITE_OTHER): Payer: Medicare PPO

## 2022-08-21 VITALS — BP 118/60 | HR 57 | Temp 98.4°F | Ht 71.0 in | Wt 191.0 lb

## 2022-08-21 DIAGNOSIS — Z0000 Encounter for general adult medical examination without abnormal findings: Secondary | ICD-10-CM

## 2022-08-21 NOTE — Telephone Encounter (Signed)
Due to the underlying cardiomyopathy, I am not sure that HIIT is the right approach. Prefer aerobic training, keeping the heart rate sustained at 70-85% of maximum, which for his age is roughly 100-120 bpm. Cardiac rehab sounds right, but is probably not covered by insurance for his diagnosis.

## 2022-08-21 NOTE — Patient Instructions (Addendum)
Marc Thornton , Thank you for taking time to come for your Medicare Wellness Visit. I appreciate your ongoing commitment to your health goals. Please review the following plan we discussed and let me know if I can assist you in the future.   These are the goals we discussed:  Goals       Lose body fat (pt-stated)      Patient Stated (pt-stated)      Continue walking for exercise and gardening.        This is a list of the screening recommended for you and due dates:  Health Maintenance  Topic Date Due   COVID-19 Vaccine (6 - 2023-24 season) 09/06/2022*   Zoster (Shingles) Vaccine (1 of 2) 11/21/2022*   Flu Shot  11/07/2022   Medicare Annual Wellness Visit  08/21/2023   DTaP/Tdap/Td vaccine (4 - Td or Tdap) 09/22/2031   Pneumonia Vaccine  Completed   Hepatitis C Screening: USPSTF Recommendation to screen - Ages 30-78 yo.  Completed   HPV Vaccine  Aged Out   Colon Cancer Screening  Discontinued  *Topic was postponed. The date shown is not the original due date.    Advanced directives: Please bring a copy of your health care power of attorney and living will to the office to be added to your chart at your convenience.   Conditions/risks identified: None  Next appointment: Follow up in one year for your annual wellness visit.   Preventive Care 34 Years and Older, Male  Preventive care refers to lifestyle choices and visits with your health care provider that can promote health and wellness. What does preventive care include? A yearly physical exam. This is also called an annual well check. Dental exams once or twice a year. Routine eye exams. Ask your health care provider how often you should have your eyes checked. Personal lifestyle choices, including: Daily care of your teeth and gums. Regular physical activity. Eating a healthy diet. Avoiding tobacco and drug use. Limiting alcohol use. Practicing safe sex. Taking low doses of aspirin every day. Taking vitamin and mineral  supplements as recommended by your health care provider. What happens during an annual well check? The services and screenings done by your health care provider during your annual well check will depend on your age, overall health, lifestyle risk factors, and family history of disease. Counseling  Your health care provider may ask you questions about your: Alcohol use. Tobacco use. Drug use. Emotional well-being. Home and relationship well-being. Sexual activity. Eating habits. History of falls. Memory and ability to understand (cognition). Work and work Astronomer. Screening  You may have the following tests or measurements: Height, weight, and BMI. Blood pressure. Lipid and cholesterol levels. These may be checked every 5 years, or more frequently if you are over 78 years old. Skin check. Lung cancer screening. You may have this screening every year starting at age 78 if you have a 30-pack-year history of smoking and currently smoke or have quit within the past 15 years. Fecal occult blood test (FOBT) of the stool. You may have this test every year starting at age 78. Flexible sigmoidoscopy or colonoscopy. You may have a sigmoidoscopy every 5 years or a colonoscopy every 10 years starting at age 78. Prostate cancer screening. Recommendations will vary depending on your family history and other risks. Hepatitis C blood test. Hepatitis B blood test. Sexually transmitted disease (STD) testing. Diabetes screening. This is done by checking your blood sugar (glucose) after you have not eaten  for a while (fasting). You may have this done every 1-3 years. Abdominal aortic aneurysm (AAA) screening. You may need this if you are a current or former smoker. Osteoporosis. You may be screened starting at age 78 if you are at high risk. Talk with your health care provider about your test results, treatment options, and if necessary, the need for more tests. Vaccines  Your health care provider  may recommend certain vaccines, such as: Influenza vaccine. This is recommended every year. Tetanus, diphtheria, and acellular pertussis (Tdap, Td) vaccine. You may need a Td booster every 10 years. Zoster vaccine. You may need this after age 49. Pneumococcal 13-valent conjugate (PCV13) vaccine. One dose is recommended after age 78. Pneumococcal polysaccharide (PPSV23) vaccine. One dose is recommended after age 78. Talk to your health care provider about which screenings and vaccines you need and how often you need them. This information is not intended to replace advice given to you by your health care provider. Make sure you discuss any questions you have with your health care provider. Document Released: 04/21/2015 Document Revised: 12/13/2015 Document Reviewed: 01/24/2015 Elsevier Interactive Patient Education  2017 [place] Prevention in the Home Falls can cause injuries. They can happen to people of all ages. There are many things you can do to make your home safe and to help prevent falls. What can I do on the outside of my home? Regularly fix the edges of walkways and driveways and fix any cracks. Remove anything that might make you trip as you walk through a door, such as a raised step or threshold. Trim any bushes or trees on the path to your home. Use bright outdoor lighting. Clear any walking paths of anything that might make someone trip, such as rocks or tools. Regularly check to see if handrails are loose or broken. Make sure that both sides of any steps have handrails. Any raised decks and porches should have guardrails on the edges. Have any leaves, snow, or ice cleared regularly. Use sand or salt on walking paths during winter. Clean up any spills in your garage right away. This includes oil or grease spills. What can I do in the bathroom? Use night lights. Install grab bars by the toilet and in the tub and shower. Do not use towel bars as grab bars. Use  non-skid mats or decals in the tub or shower. If you need to sit down in the shower, use a plastic, non-slip stool. Keep the floor dry. Clean up any water that spills on the floor as soon as it happens. Remove soap buildup in the tub or shower regularly. Attach bath mats securely with double-sided non-slip rug tape. Do not have throw rugs and other things on the floor that can make you trip. What can I do in the bedroom? Use night lights. Make sure that you have a light by your bed that is easy to reach. Do not use any sheets or blankets that are too big for your bed. They should not hang down onto the floor. Have a firm chair that has side arms. You can use this for support while you get dressed. Do not have throw rugs and other things on the floor that can make you trip. What can I do in the kitchen? Clean up any spills right away. Avoid walking on wet floors. Keep items that you use a lot in easy-to-reach places. If you need to reach something above you, use a strong step stool that has  a grab bar. Keep electrical cords out of the way. Do not use floor polish or wax that makes floors slippery. If you must use wax, use non-skid floor wax. Do not have throw rugs and other things on the floor that can make you trip. What can I do with my stairs? Do not leave any items on the stairs. Make sure that there are handrails on both sides of the stairs and use them. Fix handrails that are broken or loose. Make sure that handrails are as long as the stairways. Check any carpeting to make sure that it is firmly attached to the stairs. Fix any carpet that is loose or worn. Avoid having throw rugs at the top or bottom of the stairs. If you do have throw rugs, attach them to the floor with carpet tape. Make sure that you have a light switch at the top of the stairs and the bottom of the stairs. If you do not have them, ask someone to add them for you. What else can I do to help prevent falls? Wear  shoes that: Do not have high heels. Have rubber bottoms. Are comfortable and fit you well. Are closed at the toe. Do not wear sandals. If you use a stepladder: Make sure that it is fully opened. Do not climb a closed stepladder. Make sure that both sides of the stepladder are locked into place. Ask someone to hold it for you, if possible. Clearly mark and make sure that you can see: Any grab bars or handrails. First and last steps. Where the edge of each step is. Use tools that help you move around (mobility aids) if they are needed. These include: Canes. Walkers. Scooters. Crutches. Turn on the lights when you go into a dark area. Replace any light bulbs as soon as they burn out. Set up your furniture so you have a clear path. Avoid moving your furniture around. If any of your floors are uneven, fix them. If there are any pets around you, be aware of where they are. Review your medicines with your doctor. Some medicines can make you feel dizzy. This can increase your chance of falling. Ask your doctor what other things that you can do to help prevent falls. This information is not intended to replace advice given to you by your health care provider. Make sure you discuss any questions you have with your health care provider. Document Released: 01/19/2009 Document Revised: 08/31/2015 Document Reviewed: 04/29/2014 Elsevier Interactive Patient Education  2017 [employer].

## 2022-08-21 NOTE — Progress Notes (Signed)
Subjective:   Marc Thornton is a 77 y.o. male who presents for Medicare Annual/Subsequent preventive examination.  Review of Systems     Cardiac Risk Factors include: advanced age (>54men, >63 women);male gender;Other (see comment), Risk factor comments: Dx: HCC     Objective:    Today's Vitals   08/21/22 1617  BP: 118/60  Pulse: (!) 57  Temp: 98.4 F (36.9 C)  TempSrc: Oral  SpO2: 97%  Weight: 191 lb (86.6 kg)  Height: 5\' 11"  (1.803 m)   Body mass index is 26.64 kg/m.     08/21/2022    4:27 PM 08/22/2021    2:02 PM 08/16/2020    1:50 PM 05/13/2017    3:34 PM 04/09/2017    2:54 PM  Advanced Directives  Does Patient Have a Medical Advance Directive? Yes Yes Yes No No  Type of Estate agent of [place];Living will Healthcare Power of [place];Living will Healthcare Power of Attorney    Does patient want to make changes to medical advance directive?  No - Patient declined     Copy of Healthcare Power of Attorney in Chart? No - copy requested No - copy requested No - copy requested    Would patient like information on creating a medical advance directive?    No - Patient declined     Current Medications (verified) Outpatient Encounter Medications as of 08/21/2022  Medication Sig   acetaminophen (TYLENOL) 325 MG tablet Take by mouth as needed.   Betaine, Trimethylglycine, (TMG, TRIMETHYLGLYCINE, PO) Take by mouth. Taking 2 capsules   cholecalciferol (VITAMIN D3) 25 MCG (1000 UNIT) tablet Take 1,000 Units by mouth daily.   ibuprofen (ADVIL) 200 MG tablet Take 200 mg by mouth as needed.   ketoconazole (NIZORAL) 2 % shampoo Apply 1 application topically as directed. PRN   metoprolol tartrate (LOPRESSOR) 100 MG tablet Take 100 mg 2 hours before Coronary CT   Multiple Vitamins-Minerals (CENTRUM SILVER 50+MEN) TABS Take 1 tablet by mouth daily.   Naproxen Sodium (ALEVE PO) Take by mouth as needed.   triamcinolone cream (KENALOG) 0.1 % Apply 1 application  topically daily as needed.   No facility-administered encounter medications on file as of 08/21/2022.    Allergies (verified) Patient has no known allergies.   History: Past Medical History:  Diagnosis Date   Abnormality of gait    Arthritis    Atrial fibrillation (HCC)    BPH (benign prostatic hyperplasia)    Cardiomyopathy (HCC)    Cervical disc disorder    Chicken pox    Closed fracture of one or more phalanges of foot    Clotting disorder (HCC)    low platelets   Combined systolic and diastolic cardiac dysfunction    DDD (degenerative disc disease), cervical    Depression    Elevated PSA    Fracture of foot bone, right, closed 1964   GERD (gastroesophageal reflux disease)    Hammer toe    Hayfever    Hearing loss    Heart murmur    Hyperlipidemia    Neoplasm of uncertain behavior of skin    Pilonidal cyst with abscess    Polyneuropathy    Rheumatic fever    Seasonal allergies    Talipes cavus    Tinnitus    Past Surgical History:  Procedure Laterality Date   COLONOSCOPY     2006 normal exam   INGUINAL HERNIA REPAIR     PILONIDAL CYST EXCISION  1966   WISDOM  TOOTH EXTRACTION     Family History  Problem Relation Age of Onset   Arthritis Mother    Breast cancer Mother    Arthritis Father    Heart disease Father    Stroke Father    Hypertension Father    Prostate cancer Father    Dementia Father        vascular    Stroke Paternal Grandfather    Stroke Maternal Grandmother    Dementia Paternal Aunt        vascular   Dementia Paternal Uncle        vascular   Colon cancer Neg Hx    Esophageal cancer Neg Hx    Stomach cancer Neg Hx    Rectal cancer Neg Hx    Social History   Socioeconomic History   Marital status: Widowed    Spouse name: Not on file   Number of children: Not on file   Years of education: Not on file   Highest education level: Doctorate  Occupational History   Occupation: Retired  Tobacco Use   Smoking status: Former     Packs/day: 2.00    Years: 9.00    Additional pack years: 0.00    Total pack years: 18.00    Types: Cigarettes    Start date: 89    Quit date: 1969    Years since quitting: 55.4   Smokeless tobacco: Former  Building services engineer Use: Never used  Substance and Sexual Activity   Alcohol use: Yes    Comment: on occasion, maybe 3-4 times per month   Drug use: No   Sexual activity: Not on file  Other Topics Concern   Not on file  Social History Narrative   Lives alone   Right handed   Caffeine: 1-2 cups tea/day and 1 cup coffee some  days   Social Determinants of Health   Financial Resource Strain: Low Risk  (08/21/2022)   Overall Financial Resource Strain (CARDIA)    Difficulty of Paying Living Expenses: Not hard at all  Food Insecurity: No Food Insecurity (08/21/2022)   Hunger Vital Sign    Worried About Running Out of Food in the Last Year: Never true    Ran Out of Food in the Last Year: Never true  Transportation Needs: No Transportation Needs (08/21/2022)   PRAPARE - Administrator, Civil Service (Medical): No    Lack of Transportation (Non-Medical): No  Physical Activity: Sufficiently Active (08/21/2022)   Exercise Vital Sign    Days of Exercise per Week: 5 days    Minutes of Exercise per Session: 30 min  Stress: No Stress Concern Present (08/21/2022)   [employer] of Occupational Health - Occupational Stress Questionnaire    Feeling of Stress : Not at all  Social Connections: Socially Isolated (08/21/2022)   Social Connection and Isolation Panel [NHANES]    Frequency of Communication with Friends and Family: More than three times a week    Frequency of Social Gatherings with Friends and Family: More than three times a week    Attends Religious Services: Never    Database administrator or Organizations: No    Attends Banker Meetings: Never    Marital Status: Widowed    Tobacco Counseling Counseling given: Not Answered   Clinical  Intake:  Pre-visit preparation completed: No  Pain : No/denies pain     BMI - recorded: 26.64 Nutritional Status: BMI 25 -29 Overweight Nutritional Risks: None  Diabetes: No  How often do you need to have someone help you when you read instructions, pamphlets, or other written materials from your doctor or pharmacy?: 1 - Never  Diabetic?  No  Interpreter Needed?: No  Information entered by :: Theresa Mulligan LPN   Activities of Daily Living    08/21/2022    4:25 PM 03/07/2022    1:16 PM  In your present state of health, do you have any difficulty performing the following activities:  Hearing? 0 0  Vision? 0 0  Difficulty concentrating or making decisions? 0 1  Walking or climbing stairs? 0 0  Dressing or bathing? 0 0  Doing errands, shopping? 0 0  Preparing Food and eating ? N   Using the Toilet? N   In the past six months, have you accidently leaked urine? Y   Comment Followed by Urologist   Do you have problems with loss of bowel control? N   Managing your Medications? N   Managing your Finances? N   Housekeeping or managing your Housekeeping? N     Patient Care Team: Shirline Frees, NP as PCP - General (Family Medicine) Croitoru, Rachelle Hora, MD as PCP - Cardiology (Cardiology) Cherlyn Cushing, RN as Oncology Nurse Navigator  Indicate any recent Medical Services you may have received from other than [facility] providers in the past year (date may be approximate).     Assessment:   This is a routine wellness examination for [place].  Hearing/Vision screen Hearing Screening - Comments:: Denies hearing difficulties   Vision Screening - Comments:: Wears rx glasses - up to date with routine eye exams with  [facility]  Dietary issues and exercise activities discussed: Current Exercise Habits: Home exercise routine, Type of exercise: walking, Time (Minutes): 30, Frequency (Times/Week): 5, Weekly Exercise (Minutes/Week): 150, Intensity: Moderate, Exercise limited by: None  identified   Goals Addressed               This Visit's Progress     Lose body fat (pt-stated)         Depression Screen    08/21/2022    4:25 PM 08/22/2021    1:57 PM 05/08/2021   10:10 AM 03/06/2021    1:09 PM 08/16/2020    1:48 PM 03/01/2020   10:03 AM  PHQ 2/9 Scores  PHQ - 2 Score 0 0 0 0 0 0  PHQ- 9 Score   2 3      Fall Risk    08/21/2022    4:27 PM 03/07/2022    1:16 PM 09/21/2021    2:24 PM 08/22/2021    2:01 PM 05/08/2021   10:10 AM  Fall Risk   Falls in the past year? 0 1 1 0 1  Number falls in past yr: 0 1 0 0 0  Injury with Fall? 0 0 1 0 1  Risk for fall due to : No Fall Risks History of fall(s)  No Fall Risks   Follow up Falls prevention discussed Falls evaluation completed       FALL RISK PREVENTION PERTAINING TO THE HOME:  Any stairs in or around the home? No  If so, are there any without handrails? No Home free of loose throw rugs in walkways, pet beds, electrical cords, etc? Yes  Adequate lighting in your home to reduce risk of falls? Yes   ASSISTIVE DEVICES UTILIZED TO PREVENT FALLS:  Life alert? No  Use of a cane, walker or w/c? No  Grab bars in the  bathroom? Yes  Shower chair or bench in shower? Yes  Elevated toilet seat or a handicapped toilet? Yes  TIMED UP AND GO:  Was the test performed? Yes .  Length of time to ambulate 10 feet:  sec.   Gait steady and fast without use of assistive device  Cognitive Function:      08/19/2019    4:06 PM  Montreal Cognitive Assessment   Visuospatial/ Executive (0/5) 5  Naming (0/3) 3  Attention: Read list of digits (0/2) 2  Attention: Read list of letters (0/1) 1  Attention: Serial 7 subtraction starting at 100 (0/3) 3  Language: Repeat phrase (0/2) 2  Language : Fluency (0/1) 1  Abstraction (0/2) 2  Delayed Recall (0/5) 5  Orientation (0/6) 6  Total 30  Adjusted Score (based on education) 30      08/21/2022    4:28 PM 08/22/2021    2:03 PM 08/16/2020    2:05 PM  6CIT Screen  What  Year? 0 points 0 points 0 points  What month? 0 points 0 points 0 points  What time? 0 points 0 points   Count back from 20 0 points 0 points 0 points  Months in reverse 0 points 0 points 0 points  Repeat phrase 0 points 0 points 0 points  Total Score 0 points 0 points     Immunizations Immunization History  Administered Date(s) Administered   Fluad Quad(high Dose 65+) 11/26/2018, 03/06/2021, 03/07/2022   Influenza, High Dose Seasonal PF 03/24/2017   Influenza,inj,Quad PF,6+ Mos 03/27/2018   Influenza-Unspecified 12/24/2019, 03/06/2021   Moderna Covid-19 Vaccine Bivalent Booster 62yrs & up 03/09/2021   Moderna Sars-Covid-2 Vaccination 04/20/2019, 05/18/2019, 10/26/2020   PFIZER SARS-COV-2 Pediatric Vaccination 5-34yrs 03/12/2020   Pneumococcal Conjugate-13 07/08/2013, 11/26/2018   Pneumococcal Polysaccharide-23 10/20/2015   Td 12/11/2017   Tdap 10/24/2011, 09/21/2021    TDAP status: Up to date  Flu Vaccine status: Up to date  Pneumococcal vaccine status: Up to date  Covid-19 vaccine status: Completed vaccines  Qualifies for Shingles Vaccine? Yes   Zostavax completed No   Shingrix Completed?: No.    Education has been provided regarding the importance of this vaccine. Patient has been advised to call insurance company to determine out of pocket expense if they have not yet received this vaccine. Advised may also receive vaccine at local pharmacy or Health Dept. Verbalized acceptance and understanding.  Screening Tests Health Maintenance  Topic Date Due   COVID-19 Vaccine (6 - 2023-24 season) 09/06/2022 (Originally 07/03/2022)   Zoster Vaccines- Shingrix (1 of 2) 11/21/2022 (Originally 04/14/1964)   INFLUENZA VACCINE  11/07/2022   Medicare Annual Wellness (AWV)  08/21/2023   DTaP/Tdap/Td (4 - Td or Tdap) 09/22/2031   Pneumonia Vaccine 28+ Years old  Completed   Hepatitis C Screening  Completed   HPV VACCINES  Aged Out   COLONOSCOPY (Pts 45-56yrs Insurance coverage will need  to be confirmed)  Discontinued    Health Maintenance  There are no preventive care reminders to display for this patient.   Colorectal cancer screening: No longer required.   Lung Cancer Screening: (Low Dose CT Chest recommended if Age 63-80 years, 30 pack-year currently smoking OR have quit w/in 15years.) does not qualify.     Additional Screening:  Hepatitis C Screening: does not qualify; Completed 03/06/21  Vision Screening: Recommended annual ophthalmology exams for early detection of glaucoma and other disorders of the eye. Is the patient up to date with their annual eye exam?  Yes  Who is the provider or what is the name of the office in which the patient attends annual eye exams? [facility] If pt is not established with a provider, would they like to be referred to a provider to establish care? No .   Dental Screening: Recommended annual dental exams for proper oral hygiene  Community Resource Referral / Chronic Care Management:  CRR required this visit?  No   CCM required this visit?  No     Plan:     I have personally reviewed and noted the following in the patient's chart:   Medical and social history Use of alcohol, tobacco or illicit drugs  Current medications and supplements including opioid prescriptions. Patient is not currently taking opioid prescriptions. Functional ability and status Nutritional status Physical activity Advanced directives List of other physicians Hospitalizations, surgeries, and ER visits in previous 12 months Vitals Screenings to include cognitive, depression, and falls Referrals and appointments  In addition, I have reviewed and discussed with patient certain preventive protocols, quality metrics, and best practice recommendations. A written personalized care plan for preventive services as well as general preventive health recommendations were provided to patient.     Tillie Rung, LPN   07/15/8117   Nurse Notes:   None

## 2022-08-27 ENCOUNTER — Encounter: Payer: Self-pay | Admitting: Cardiovascular Disease

## 2022-08-29 DIAGNOSIS — M25561 Pain in right knee: Secondary | ICD-10-CM | POA: Diagnosis not present

## 2022-08-29 DIAGNOSIS — M1711 Unilateral primary osteoarthritis, right knee: Secondary | ICD-10-CM | POA: Diagnosis not present

## 2022-08-29 DIAGNOSIS — M1612 Unilateral primary osteoarthritis, left hip: Secondary | ICD-10-CM | POA: Diagnosis not present

## 2022-08-29 DIAGNOSIS — M25552 Pain in left hip: Secondary | ICD-10-CM | POA: Diagnosis not present

## 2022-09-08 ENCOUNTER — Encounter: Payer: Self-pay | Admitting: Adult Health

## 2022-09-13 NOTE — Telephone Encounter (Signed)
FYI

## 2022-09-19 ENCOUNTER — Encounter: Payer: Self-pay | Admitting: Adult Health

## 2022-10-09 DIAGNOSIS — C61 Malignant neoplasm of prostate: Secondary | ICD-10-CM | POA: Diagnosis not present

## 2022-10-11 NOTE — Progress Notes (Signed)
Patient is under the care of Dr. Harold Barban @ [facility].

## 2022-11-13 DIAGNOSIS — C61 Malignant neoplasm of prostate: Secondary | ICD-10-CM | POA: Diagnosis not present

## 2022-12-08 ENCOUNTER — Encounter: Payer: Self-pay | Admitting: Cardiovascular Disease

## 2023-01-31 ENCOUNTER — Telehealth: Payer: Self-pay | Admitting: Cardiovascular Disease

## 2023-01-31 NOTE — Telephone Encounter (Signed)
Paper Work Dropped Off:  MRI CD Date: 01/31/2023 Location of paper:   In Dr. Lewie Chamber

## 2023-02-05 ENCOUNTER — Encounter: Payer: Self-pay | Admitting: Cardiovascular Disease

## 2023-02-05 ENCOUNTER — Ambulatory Visit
Payer: No Typology Code available for payment source | Attending: Cardiovascular Disease | Admitting: Cardiovascular Disease

## 2023-02-05 VITALS — BP 124/54 | HR 81 | Ht 71.0 in | Wt 193.4 lb

## 2023-02-05 DIAGNOSIS — I422 Other hypertrophic cardiomyopathy: Secondary | ICD-10-CM

## 2023-02-05 NOTE — Patient Instructions (Signed)
Medication Instructions:  No changes *If you need a refill on your cardiac medications before your next appointment, please call your pharmacy*  Testing/Procedures: Your physician has requested that you have an echocardiogram. Echocardiography is a painless test that uses sound waves to create images of your heart. It provides your doctor with information about the size and shape of your heart and how well your heart's chambers and valves are working. This procedure takes approximately one hour. There are no restrictions for this procedure. Please do NOT wear cologne, perfume, aftershave, or lotions (deodorant is allowed). Please arrive 15 minutes prior to your appointment time.    Follow-Up: At [facility], you and your health needs are our priority.  As part of our continuing mission to provide you with exceptional heart care, we have created designated Provider Care Teams.  These Care Teams include your primary Cardiologist (physician) and Advanced Practice Providers (APPs -  Physician Assistants and Nurse Practitioners) who all work together to provide you with the care you need, when you need it.  We recommend signing up for the patient portal called "MyChart".  Sign up information is provided on this After Visit Summary.  MyChart is used to connect with patients for Virtual Visits (Telemedicine).  Patients are able to view lab/test results, encounter notes, upcoming appointments, etc.  Non-urgent messages can be sent to your provider as well.   To learn more about what you can do with MyChart, go to [email or web address].    Your next appointment:   1 year(s)  Provider:   Mihai Croitoru, MD      

## 2023-02-06 ENCOUNTER — Encounter: Payer: Self-pay | Admitting: Cardiovascular Disease

## 2023-02-06 NOTE — Progress Notes (Signed)
Cardiology Office Note    Date:  02/06/2023   ID:  Marc Thornton, DOB 1945/08/12, MRN [id number]  PCP:  Shirline Frees, NP  Cardiologist:   Thurmon Fair, MD   Chief complaint: Follow-up cardiomyopathy   History of Present Illness:  Marc Thornton is a 77 y.o. male with asymptomatic apical hypertrophic cardiomyopathy, to date without any clinically relevant episodes of arrhythmia or congestive heart failure.  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  He is confident that this is the cause for his father's dementia concerned about its impact via microvascular disease and other organs such as his heart.  He has not had any new cardiac problems or complaints since his last appointment but underwent cryotherapy for prostate cancer at [facility].  He currently has an indwelling Foley for another week or so.  He has no complaints except those related to the catheter.  He denies shortness of breath at rest or with activity, chest pain, palpitations, dizziness, syncope, lower extremity edema or claudication.  PET scan showed evidence of microvascular dysfunction with a myocardial blood flow reserve that was only 1.47 with evidence of ischemia in the apex, but cardiac CTA did not show any evidence of epicardial coronary disease.  Coronary calcium score was only 133 which for his age places him in the 32nd percentile.  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  Cardiac MRI shows a roughly 14% burden of scar by late gadolinium enhancement mostly located in the apical subendocardial area.  There is evidence of apical displacement of his papillary muscles.  He does not have left ventricular outflow tract obstruction.  There is no defined wall motion abnormality on echocardiogram, where he has hyperdynamic left ventricular systolic function.  Similar findings are present on his echocardiogram from  2020 although the diagnosis of apical variant hypertrophic cardiomyopathy was not defined on that report.  An event monitor in 2020 showed frequent PVCs and occasional very brief episodes of paroxysmal atrial tachycardia.  He did not have sustained or nonsustained VT or atrial fibrillation or significant bradycardia.  PYP scan did not show evidence of cardiac amyloidosis.  Nuclear stress testing in 2017 showed a hypertensive response to exercise but no perfusion abnormalities.  He does not have a personal history of arrhythmia or syncope or family history of sudden cardiac death or ventricular tachycardia.    He has a family history of neurological decline at advanced ages (63s) which has led to the demise of his father and paternal uncles with a fairly predictable pattern of dementia culminating in a stroke.  This is due to a familial pattern of "small vessel disease".  His paternal grandfather had intracranial hemorrhage in his early 37s.  His paternal aunt and uncle had vascular dementia.  His father had coronary artery disease, bypass surgery at age 39 congestive heart failure and took diuretics for about 20 years, had a stroke at age 53 and died at age 48.  He is concerned that he has noticed subtle reductions in his frontal lobe functions,  such as complex task strategizing and difficulty finding words.  His MRI does confirm some white matter abnormalities on T2 flair imaging suggestive of microvascular ischemia.  He has been seeing Dr. Terrace Arabia in the neurology clinic.  He spent a year and a half in [place] in the [employer] and had some  exposure to [employer]. He quit smoking 50 years ago.     Past Medical History:  Diagnosis Date   Abnormality of gait    Arthritis    Atrial fibrillation (HCC)    BPH (benign prostatic hyperplasia)    Cardiomyopathy (HCC)    Cervical disc disorder    Chicken pox    Closed fracture of one or more phalanges of foot    Clotting disorder (HCC)    low platelets    Combined systolic and diastolic cardiac dysfunction    DDD (degenerative disc disease), cervical    Depression    Elevated PSA    Fracture of foot bone, right, closed 1964   GERD (gastroesophageal reflux disease)    Hammer toe    Hayfever    Hearing loss    Heart murmur    Hyperlipidemia    Neoplasm of uncertain behavior of skin    Pilonidal cyst with abscess    Polyneuropathy    Rheumatic fever    Seasonal allergies    Talipes cavus    Tinnitus     Past Surgical History:  Procedure Laterality Date   COLONOSCOPY     2006 normal exam   INGUINAL HERNIA REPAIR     PILONIDAL CYST EXCISION  1966   WISDOM TOOTH EXTRACTION      Current Medications: Outpatient Medications Prior to Visit  Medication Sig Dispense Refill   acetaminophen (TYLENOL) 325 MG tablet Take by mouth as needed.     Betaine, Trimethylglycine, (TMG, TRIMETHYLGLYCINE, PO) Take by mouth. Taking 2 capsules     cholecalciferol (VITAMIN D3) 25 MCG (1000 UNIT) tablet Take 1,000 Units by mouth daily.     ibuprofen (ADVIL) 200 MG tablet Take 200 mg by mouth as needed.     ketoconazole (NIZORAL) 2 % shampoo Apply 1 application topically as directed. PRN     Multiple Vitamins-Minerals (CENTRUM SILVER 50+MEN) TABS Take 1 tablet by mouth daily.     Naproxen Sodium (ALEVE PO) Take by mouth as needed.     triamcinolone cream (KENALOG) 0.1 % Apply 1 application topically daily as needed.     metoprolol tartrate (LOPRESSOR) 100 MG tablet Take 100 mg 2 hours before Coronary CT (Patient not taking: Reported on 02/05/2023) 1 tablet 0   No facility-administered medications prior to visit.     Allergies:   Patient has no known allergies.   Social History   Socioeconomic History   Marital status: Widowed    Spouse name: Not on file   Number of children: Not on file   Years of education: Not on file   Highest education level: Doctorate  Occupational History   Occupation: Retired  Tobacco Use   Smoking status: Former     Current packs/day: 0.00    Average packs/day: 2.0 packs/day for 9.0 years (18.0 ttl pk-yrs)    Types: Cigarettes    Start date: 56    Quit date: 1969    Years since quitting: 55.8   Smokeless tobacco: Former  Building services engineer status: Never Used  Substance and Sexual Activity   Alcohol use: Yes    Comment: on occasion, maybe 3-4 times per month   Drug use: No   Sexual activity: Not on file  Other Topics Concern   Not on file  Social History Narrative   Lives alone   Right handed   Caffeine: 1-2 cups tea/day and 1 cup coffee some  days   Social Determinants of Health  Financial Resource Strain: Low Risk  (08/21/2022)   Overall Financial Resource Strain (CARDIA)    Difficulty of Paying Living Expenses: Not hard at all  Food Insecurity: No Food Insecurity (08/21/2022)   Hunger Vital Sign    Worried About Running Out of Food in the Last Year: Never true    Ran Out of Food in the Last Year: Never true  Transportation Needs: No Transportation Needs (08/21/2022)   PRAPARE - Administrator, Civil Service (Medical): No    Lack of Transportation (Non-Medical): No  Physical Activity: Sufficiently Active (08/21/2022)   Exercise Vital Sign    Days of Exercise per Week: 5 days    Minutes of Exercise per Session: 30 min  Stress: No Stress Concern Present (08/21/2022)   [employer] of Occupational Health - Occupational Stress Questionnaire    Feeling of Stress : Not at all  Social Connections: Socially Isolated (08/21/2022)   Social Connection and Isolation Panel [NHANES]    Frequency of Communication with Friends and Family: More than three times a week    Frequency of Social Gatherings with Friends and Family: More than three times a week    Attends Religious Services: Never    Database administrator or Organizations: No    Attends Banker Meetings: Never    Marital Status: Widowed     Family History:  The patient's family history includes Arthritis  in his father and mother; Breast cancer in his mother; Dementia in his father, paternal aunt, and paternal uncle; Heart disease in his father; Hypertension in his father; Prostate cancer in his father; Stroke in his father, maternal grandmother, and paternal grandfather.   ROS:   Please see the history of present illness.    ROS All other systems reviewed and are negative.   PHYSICAL EXAM:   VS:  BP (!) 124/54 (BP Location: Left Arm, Patient Position: Sitting, Cuff Size: Normal)   Pulse 81   Ht 5\' 11"  (1.803 m)   Wt 193 lb 6.4 oz (87.7 kg)   SpO2 96%   BMI 26.97 kg/m      General: Alert, oriented x3, no distress, appears fit, younger than stated age. Head: no evidence of trauma, PERRL, EOMI, no exophtalmos or lid lag, no myxedema, no xanthelasma; normal ears, nose and oropharynx Neck: normal jugular venous pulsations and no hepatojugular reflux; brisk carotid pulses without delay and no carotid bruits Chest: clear to auscultation, no signs of consolidation by percussion or palpation, normal fremitus, symmetrical and full respiratory excursions Cardiovascular: normal position and quality of the apical impulse, regular rhythm, normal first and second heart sounds, 2/6 mid peaking systolic murmur is heard broadly from the right upper sternal border to the left lower sternal border and shows dynamic changes (decreases with handgrip, increases with Valsalva maneuver), there are no diastolic murmurs, rubs or gallops Abdomen: no tenderness or distention, no masses by palpation, no abnormal pulsatility or arterial bruits, normal bowel sounds, no hepatosplenomegaly Extremities: no clubbing, cyanosis or edema; 2+ radial, ulnar and brachial pulses bilaterally; 2+ right femoral, posterior tibial and dorsalis pedis pulses; 2+ left femoral, posterior tibial and dorsalis pedis pulses; no subclavian or femoral bruits Neurological: grossly nonfocal Psych: Normal mood and affect   Wt Readings from Last 3  Encounters:  02/05/23 193 lb 6.4 oz (87.7 kg)  08/21/22 191 lb (86.6 kg)  04/19/22 193 lb 9.6 oz (87.8 kg)      Studies/Labs Reviewed:   PET scan 07/31/2022  Reversible perfusion defect in all apical segments, consistent with ischemia.  Patient has known apical HCM, and apical ischemia is is commonly seen.  However, TID is also present (1.19) and myocardial blood flow reserve is reduced both globally and in each coronary distribution, which could be due to either multivessel obstructive CAD or microvascular dysfunction.  Given only mild coronary calcifications on CT, suspect findings represent microvascular dysfunction, but recommend either cath or coronary CTA to rule out obstructive CAD   LV perfusion is abnormal. There is evidence of ischemia. Defect 1: There is a medium defect with mild reduction in uptake present in the apical anterior, inferior, lateral, septal and apex location(s) that is reversible. There is normal wall motion in the defect area. Consistent with ischemia.   Rest left ventricular function is normal. Rest EF: 50 %. Stress left ventricular function is normal. Stress EF: 52 %. End diastolic cavity size is normal. End systolic cavity size is normal.   Myocardial blood flow was computed to be 0.3ml/g/min at rest and 1.21ml/g/min at stress. Global myocardial blood flow reserve was 1.47 and was abnormal.   Coronary calcium was present on the attenuation correction CT images. Mild coronary calcifications were present. Coronary calcifications were present in the left anterior descending artery distribution(s).   Findings are consistent with ischemia. The study is high risk.   Electronically signed by Senaida Lange, MD  Coronary CT 08/20/2022:  1. Coronary calcium score of 133. This was 32nd percentile for age, sex, and race matched control. 2. Normal coronary origin with right dominance. 3. CAD-RADS 1. Minimal non-obstructive CAD (1-24%). Consider non-atherosclerotic  causes of chest pain. Consider preventive therapy and risk factor modification.  cMRI 10/24/2020: 1. Findings consistent with apical variant hypertrophic cardiomyopathy. ECG with marked T wave abnormalities, disproportionate apical thickening 15 mm with displaced hypertrophied papillary muscles, Systolic cavity obliteration, and hyper enhancement post gadolinium involving 14.4% of the total myocardium   2.  Normal LV function quantitative EF 69%   3.  Normal RV size and function   4.  Normal cardiac valves   5.  Normal aortic root 3.7 cm  ECHO 12/10/2018:  1. The left ventricle has hyperdynamic systolic function, with an ejection fraction of >65%. The cavity size was normal. Left ventricular diastolic Doppler parameters are consistent with impaired relaxation. Indeterminate filling pressures The E/e' is  8-15. No evidence of left ventricular regional wall motion abnormalities.  2. The right ventricle has normal systolic function. The cavity was normal. There is no increase in right ventricular wall thickness.  3. The mitral valve is grossly normal.  4. The aortic valve was not well visualized. No stenosis of the aortic valve.  5. The aorta is normal unless otherwise noted.  6. The interatrial septum was not well visualized.  On my review of the above echo images, I think he probably has apical variant hypertrophic cardiomyopathy.  There was no evidence of systolic anterior motion of the mitral valve or any left ventricular outflow tract obstruction at rest, but provocative maneuvers were not performed.  Event monitor 01/28/2019: Abnormal event monitor due to the presence of frequent PVCs and occasional brief runs of nonsustained atrial tachycardia.   There is no evidence of significant bradycardia, ventricular tachycardia or atrial fibrillation.  EKG: EKG today shows no changes from previous tracings.  Normal sinus rhythm, left atrial enlargement, prominent changes of LVH with deep  ST-T wave segment inversion in the lateral precordial leads.  There is a sudden R wave transition  in lead V2 and there is a isolated deep sharp Q waves in lead aVL LABS: [facility] 10/20/2015 Cholesterol 178, triglycerides 303, HDL 43, LDL 74, hemoglobin A1c 5.4%  07/07/2019 Total cholesterol 167, HDL 38, LDL 94, triglycerides 175 Hemoglobin A1c 5.5% Hemoglobin 15.4, creatinine 0.95, potassium 3.9, TSH 2.87  03/01/2020 Cholesterol 172, HDL 41, LDL 108, triglycerides 120 Hemoglobin A1c 5.3% Hemoglobin 14.7, creatinine 0.95, potassium 4.0, TSH 2.51, ALT 17  03/06/2021 Cholesterol 173, HDL 46.6, LDL 103, triglycerides 120 Hemoglobin A1c 5.4% Hemoglobin 15.7, creatinine 1.08, potassium 4.5, ALT 22, TSH 2.11  03/07/2022 Cholesterol 159, HDL 46.8, LDL 96, triglycerides 79 Hemoglobin 15.4, ALT 19  08/19/2022 Creatinine 1.07, potassium 4.9, hemoglobin A1c 5.4%  ASSESSMENT:    1. Hypertrophic cardiomyopathy (HCC)      PLAN:  In order of problems listed above:   CHF: Functional class I-II, clinically euvolemic without diuretics.  Although he does not describe any limitations in his exercise ability doing activities of daily living, he did have somewhat reduced exercise tolerance on a standardized Bruce protocol treadmill test.  He has echo evidence of diastolic dysfunction, but has not had overt severe heart failure.   Apical variant hypertrophic cardiomyopathy: today for the first time since I have been taking care of him he has a prominent systolic murmur that has dynamic behavior suggestive of LV outflow tract obstruction.  Will schedule him for repeat echocardiogram with provocative maneuvers.  Previously all features on ECG/previous echo/cardiac MRI suggested that he had apical variant HCM.  There is no family history of early death from arrhythmia or heart failure.  Late gadolinium enhancement demonstrates a relatively small left ventricular scar since 14%), limited to the apex.   Prognosis is likely to be very good.  He is not on beta-blockers.  No clear reason for the development of a murmur today except the fact that his heart rate is a little faster than usual, probably due to the anticholinergic effects of the oxybutynin.   It is likely that his previous complaints suggestive of  "walk-through angina" are related to subendocardial ischemia in the setting of hypertrophic cardiomyopathy. HyperTG: All his lipid parameters are in desirable range. CADASIL Cerebral small vessel disease: I think he is doing a great job addressing his risk factors with an excellent lipid profile, normal glucose metabolism, good resting blood pressure, healthy diet, moderate regular exercise.  No specific treatment is available for his genetic disorder.  Medication Adjustments/Labs and Tests Ordered: Current medicines are reviewed at length with the patient today.  Concerns regarding medicines are outlined above.  Medication changes, Labs and Tests ordered today are listed in the Patient Instructions below. Patient Instructions  Medication Instructions:  No changes *If you need a refill on your cardiac medications before your next appointment, please call your pharmacy*  Testing/Procedures: Your physician has requested that you have an echocardiogram. Echocardiography is a painless test that uses sound waves to create images of your heart. It provides your doctor with information about the size and shape of your heart and how well your heart's chambers and valves are working. This procedure takes approximately one hour. There are no restrictions for this procedure. Please do NOT wear cologne, perfume, aftershave, or lotions (deodorant is allowed). Please arrive 15 minutes prior to your appointment time.    Follow-Up: At [facility], you and your health needs are our priority.  As part of our continuing mission to provide you with exceptional heart care, we have created designated  Provider  Care Teams.  These Care Teams include your primary Cardiologist (physician) and Advanced Practice Providers (APPs -  Physician Assistants and Nurse Practitioners) who all work together to provide you with the care you need, when you need it.  We recommend signing up for the patient portal called "MyChart".  Sign up information is provided on this After Visit Summary.  MyChart is used to connect with patients for Virtual Visits (Telemedicine).  Patients are able to view lab/test results, encounter notes, upcoming appointments, etc.  Non-urgent messages can be sent to your provider as well.   To learn more about what you can do with MyChart, go to [email or web address].    Your next appointment:   1 year(s)  Provider:   Thurmon Fair, MD       Signed, Thurmon Fair, MD  02/06/2023 10:04 AM    [facility] [address] [place], [place], [place]  [address] Phone: [phone]; Fax: [phone]

## 2023-02-06 NOTE — Telephone Encounter (Signed)
Oxybutinin can definitely cause a dry mouth (by far the most common side effect). It can also accelerate the heart beat a little bit. I do not think I ever got the CD he is referring to. When did he leave it? There was no LVOT obstruction on the MRI performed at [facility], but the echo is better at detecting that problem since we can do "provocative maneuvers" (Valsava for example).

## 2023-02-07 ENCOUNTER — Encounter: Payer: Self-pay | Admitting: Cardiovascular Disease

## 2023-02-07 ENCOUNTER — Ambulatory Visit (HOSPITAL_COMMUNITY): Payer: No Typology Code available for payment source | Attending: Internal Medicine

## 2023-02-07 DIAGNOSIS — I422 Other hypertrophic cardiomyopathy: Secondary | ICD-10-CM

## 2023-02-07 LAB — ECHOCARDIOGRAM COMPLETE
Area-P 1/2: 2.61 cm2
S' Lateral: 3.5 cm

## 2023-02-10 ENCOUNTER — Encounter: Payer: Self-pay | Admitting: Cardiovascular Disease

## 2023-02-11 ENCOUNTER — Encounter: Payer: Self-pay | Admitting: Cardiovascular Disease

## 2023-02-12 ENCOUNTER — Other Ambulatory Visit: Payer: Self-pay

## 2023-02-12 ENCOUNTER — Encounter: Payer: Self-pay | Admitting: Adult Health

## 2023-02-12 ENCOUNTER — Other Ambulatory Visit (INDEPENDENT_AMBULATORY_CARE_PROVIDER_SITE_OTHER): Payer: Medicare PPO

## 2023-02-12 DIAGNOSIS — R109 Unspecified abdominal pain: Secondary | ICD-10-CM

## 2023-02-12 LAB — CBC WITH DIFFERENTIAL/PLATELET
Basophils Absolute: 0.1 10*3/uL (ref 0.0–0.1)
Basophils Relative: 0.9 % (ref 0.0–3.0)
Eosinophils Absolute: 0.3 10*3/uL (ref 0.0–0.7)
Eosinophils Relative: 4.1 % (ref 0.0–5.0)
HCT: 42.4 % (ref 39.0–52.0)
Hemoglobin: 13.8 g/dL (ref 13.0–17.0)
Lymphocytes Relative: 38.1 % (ref 12.0–46.0)
Lymphs Abs: 3.1 10*3/uL (ref 0.7–4.0)
MCHC: 32.6 g/dL (ref 30.0–36.0)
MCV: 93.8 fL (ref 78.0–100.0)
Monocytes Absolute: 0.9 10*3/uL (ref 0.1–1.0)
Monocytes Relative: 10.7 % (ref 3.0–12.0)
Neutro Abs: 3.7 10*3/uL (ref 1.4–7.7)
Neutrophils Relative %: 46.2 % (ref 43.0–77.0)
Platelets: 228 10*3/uL (ref 150.0–400.0)
RBC: 4.52 Mil/uL (ref 4.22–5.81)
RDW: 14.4 % (ref 11.5–15.5)
WBC: 8 10*3/uL (ref 4.0–10.5)

## 2023-02-12 LAB — BASIC METABOLIC PANEL
BUN: 23 mg/dL (ref 6–23)
CO2: 29 meq/L (ref 19–32)
Calcium: 9.5 mg/dL (ref 8.4–10.5)
Chloride: 101 meq/L (ref 96–112)
Creatinine, Ser: 1.07 mg/dL (ref 0.40–1.50)
GFR: 66.32 mL/min (ref >60.00)
Glucose, Bld: 86 mg/dL (ref 70–99)
Potassium: 3.8 meq/L (ref 3.5–5.1)
Sodium: 138 meq/L (ref 135–145)

## 2023-02-12 NOTE — Telephone Encounter (Signed)
FYI

## 2023-02-13 ENCOUNTER — Encounter: Payer: Self-pay | Admitting: Adult Health

## 2023-02-13 NOTE — Telephone Encounter (Signed)
FYI

## 2023-02-14 ENCOUNTER — Ambulatory Visit
Payer: No Typology Code available for payment source | Attending: Cardiovascular Disease | Admitting: Cardiovascular Disease

## 2023-02-14 ENCOUNTER — Encounter: Payer: Self-pay | Admitting: Cardiovascular Disease

## 2023-02-14 DIAGNOSIS — R011 Cardiac murmur, unspecified: Secondary | ICD-10-CM

## 2023-02-16 ENCOUNTER — Encounter: Payer: Self-pay | Admitting: Cardiovascular Disease

## 2023-02-16 NOTE — Progress Notes (Signed)
At his most recent appointment, [name] had a new dynamic systolic ejection murmur consistent with hypertrophic obstructive cardiomyopathy.  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   The only change in his state of health was addition of oxybutynin which did increase his heart rate (usually in the 50s, up to 81 bpm).   He discontinued the medication and came back today for an office visit.  There was no audible murmur either at rest or with a Valsalva maneuver.  His heart rate is again in the high 50s.

## 2023-02-24 ENCOUNTER — Encounter: Payer: Self-pay | Admitting: Cardiovascular Disease

## 2023-02-24 ENCOUNTER — Encounter: Payer: Self-pay | Admitting: Adult Health

## 2023-02-24 DIAGNOSIS — I493 Ventricular premature depolarization: Secondary | ICD-10-CM

## 2023-02-24 NOTE — Telephone Encounter (Signed)
Forwarded to Dr. Royann Shivers.

## 2023-02-25 ENCOUNTER — Other Ambulatory Visit: Payer: Self-pay | Admitting: Adult Health

## 2023-02-25 ENCOUNTER — Encounter: Payer: Self-pay | Admitting: Adult Health

## 2023-02-25 ENCOUNTER — Telehealth: Payer: Self-pay | Admitting: Adult Health

## 2023-02-25 DIAGNOSIS — Z7184 Encounter for health counseling related to travel: Secondary | ICD-10-CM

## 2023-02-25 DIAGNOSIS — Z0184 Encounter for antibody response examination: Secondary | ICD-10-CM

## 2023-02-25 NOTE — Telephone Encounter (Signed)
Pt is calling and would like to know about chicken pox and MMR vaccine

## 2023-02-25 NOTE — Telephone Encounter (Signed)
Please order a 7-day arrhythmia monitor for him for PVCs.  To be completed so that it can be interpreted before he leaves the country on December 18.  (Ideally get it on him before Thanksgiving).  Thank you

## 2023-02-25 NOTE — Telephone Encounter (Signed)
Pt called, requesting to speak to CMA.  Pt was asked if call was regarding a medication refill, as I could help with that. Pt stated he needed some clarification from CMA.  Please return Pt's call at your earliest convenience.

## 2023-02-26 ENCOUNTER — Other Ambulatory Visit (INDEPENDENT_AMBULATORY_CARE_PROVIDER_SITE_OTHER): Payer: Medicare PPO

## 2023-02-26 ENCOUNTER — Encounter: Payer: Self-pay | Admitting: Adult Health

## 2023-02-26 ENCOUNTER — Ambulatory Visit: Payer: No Typology Code available for payment source | Attending: Cardiovascular Disease

## 2023-02-26 DIAGNOSIS — Z0184 Encounter for antibody response examination: Secondary | ICD-10-CM | POA: Diagnosis not present

## 2023-02-26 DIAGNOSIS — Z7184 Encounter for health counseling related to travel: Secondary | ICD-10-CM | POA: Diagnosis not present

## 2023-02-26 DIAGNOSIS — I493 Ventricular premature depolarization: Secondary | ICD-10-CM

## 2023-02-26 NOTE — Progress Notes (Unsigned)
Enrolled patient for a 7 day Zio XT monitor to be mailed to patients home.  

## 2023-02-26 NOTE — Addendum Note (Signed)
Addended by: Scheryl Marten on: 02/26/2023 10:17 AM   Modules accepted: Orders

## 2023-02-28 LAB — MEASLES/MUMPS/RUBELLA IMMUNITY
Mumps IgG: 88.4 [AU]/ml
Rubella: 9.8 {index}
Rubeola IgG: >300 [AU]/ml

## 2023-02-28 LAB — VARICELLA ZOSTER ANTIBODY, IGG: Varicella IgG: 17.8 {s_co_ratio}

## 2023-03-01 ENCOUNTER — Encounter: Payer: Self-pay | Admitting: Adult Health

## 2023-03-04 NOTE — Telephone Encounter (Signed)
Please advise 

## 2023-03-05 ENCOUNTER — Other Ambulatory Visit (HOSPITAL_COMMUNITY): Payer: No Typology Code available for payment source

## 2023-03-08 DIAGNOSIS — I493 Ventricular premature depolarization: Secondary | ICD-10-CM | POA: Diagnosis not present

## 2023-03-12 ENCOUNTER — Ambulatory Visit (INDEPENDENT_AMBULATORY_CARE_PROVIDER_SITE_OTHER): Payer: Medicare PPO | Admitting: Adult Health

## 2023-03-12 ENCOUNTER — Encounter: Payer: Self-pay | Admitting: Adult Health

## 2023-03-12 ENCOUNTER — Encounter: Payer: No Typology Code available for payment source | Admitting: Adult Health

## 2023-03-12 VITALS — BP 120/80 | HR 68 | Temp 97.8°F | Ht 71.0 in | Wt 194.0 lb

## 2023-03-12 DIAGNOSIS — Z0000 Encounter for general adult medical examination without abnormal findings: Secondary | ICD-10-CM | POA: Diagnosis not present

## 2023-03-12 DIAGNOSIS — E782 Mixed hyperlipidemia: Secondary | ICD-10-CM | POA: Diagnosis not present

## 2023-03-12 DIAGNOSIS — C61 Malignant neoplasm of prostate: Secondary | ICD-10-CM | POA: Diagnosis not present

## 2023-03-12 DIAGNOSIS — I5032 Chronic diastolic (congestive) heart failure: Secondary | ICD-10-CM

## 2023-03-12 DIAGNOSIS — I422 Other hypertrophic cardiomyopathy: Secondary | ICD-10-CM

## 2023-03-12 DIAGNOSIS — I739 Peripheral vascular disease, unspecified: Secondary | ICD-10-CM | POA: Diagnosis not present

## 2023-03-12 LAB — COMPREHENSIVE METABOLIC PANEL
ALT: 16 U/L (ref 0–53)
AST: 26 U/L (ref 0–37)
Albumin: 4.1 g/dL (ref 3.5–5.2)
Alkaline Phosphatase: 64 U/L (ref 39–117)
BUN: 30 mg/dL — ABNORMAL HIGH (ref 6–23)
CO2: 28 meq/L (ref 19–32)
Calcium: 9.5 mg/dL (ref 8.4–10.5)
Chloride: 105 meq/L (ref 96–112)
Creatinine, Ser: 1 mg/dL (ref 0.40–1.50)
GFR: 71.89 mL/min (ref >60.00)
Glucose, Bld: 91 mg/dL (ref 70–99)
Potassium: 4 meq/L (ref 3.5–5.1)
Sodium: 139 meq/L (ref 135–145)
Total Bilirubin: 0.8 mg/dL (ref 0.2–1.2)
Total Protein: 7.3 g/dL (ref 6.0–8.3)

## 2023-03-12 LAB — LIPID PANEL
Cholesterol: 149 mg/dL (ref 0–200)
HDL: 38.2 mg/dL — ABNORMAL LOW (ref >39.00)
LDL Cholesterol: 90 mg/dL (ref 0–99)
NonHDL: 111.17
Total CHOL/HDL Ratio: 4
Triglycerides: 108 mg/dL (ref 0.0–149.0)
VLDL: 21.6 mg/dL (ref 0.0–40.0)

## 2023-03-12 LAB — VITAMIN D 25 HYDROXY (VIT D DEFICIENCY, FRACTURES): VITD: 30.41 ng/mL (ref 30.00–100.00)

## 2023-03-12 LAB — TSH: TSH: 1.55 u[IU]/mL (ref 0.35–5.50)

## 2023-03-12 LAB — PSA: PSA: 3.75 ng/mL (ref 0.10–4.00)

## 2023-03-12 NOTE — Patient Instructions (Addendum)
It was great seeing you today   We will follow up with you regarding your lab work   Please let me know if you need anything   I hope you have a great trip!

## 2023-03-12 NOTE — Progress Notes (Signed)
Subjective:    Patient ID: Marc Thornton, male    DOB: 1945-04-10, 77 y.o.   MRN: [id number]  HPI Patient presents for yearly preventative medicine examination. He is a pleasant 77 year old male who  has a past medical history of Abnormality of gait, Arthritis, Atrial fibrillation (HCC), BPH (benign prostatic hyperplasia), Cardiomyopathy (HCC), Cervical disc disorder, Chicken pox, Closed fracture of one or more phalanges of foot, Clotting disorder (HCC), Combined systolic and diastolic cardiac dysfunction, DDD (degenerative disc disease), cervical, Depression, Elevated PSA, Fracture of foot bone, right, closed (1964), GERD (gastroesophageal reflux disease), Hammer toe, Hayfever, Hearing loss, Heart murmur, Hyperlipidemia, Neoplasm of uncertain behavior of skin, Pilonidal cyst with abscess, Polyneuropathy, Rheumatic fever, Seasonal allergies, Talipes cavus, and Tinnitus.  He is leaving later this month to go on a month long vacation with his sons to [place]   H/o CHF -managed by cardiology.  He does have some limitation on functional ability on formal stress testing although he did not have any symptoms during activities of daily living.  He does not require diuretics at this time.  Evidence of on echo of reduced diastolic relaxation properties of the left ventricle.  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  This was confirmed by MRI of typical findings on EKG.  He has no history of complex ventricular arrhythmia, history of syncope, or family history of sudden cardiac death or arrhythmia.  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  Cognitive decline with family history of vascular dementia. He is currently doing a study through the [facility].    Prostate Cancer - underwent cryosurgical ablation of the  prostate in October 2024 at [facility].  Vitamin D deficiency  - takes 1000 units daily  History of Hyperlipidemia - not currently on medication  Lab Results  Component Value Date   CHOL 159 03/07/2022   HDL 46.80 03/07/2022   LDLCALC 96 03/07/2022   TRIG 79.0 03/07/2022   CHOLHDL 3 03/07/2022   All immunizations and health maintenance protocols were reviewed with the patient and needed orders were placed.  Appropriate screening laboratory values were ordered for the patient including screening of hyperlipidemia, renal function and hepatic function. If indicated by BPH, a PSA was ordered.  Medication reconciliation,  past medical history, social history, problem list and allergies were reviewed in detail with the patient  Goals were established with regard to weight loss, exercise, and  diet in compliance with medications. He does try and eat a healthy diet and walks multiple times a week.   Wt Readings from Last 3 Encounters:  03/12/23 194 lb (88 kg)  02/05/23 193 lb 6.4 oz (87.7 kg)  08/21/22 191 lb (86.6 kg)     Review of Systems  Constitutional: Negative.   HENT: Negative.    Eyes: Negative.   Respiratory: Negative.    Cardiovascular: Negative.   Gastrointestinal: Negative.   Endocrine: Negative.   Genitourinary: Negative.   Musculoskeletal: Negative.   Skin: Negative.   Allergic/Immunologic: Negative.   Neurological: Negative.   Hematological: Negative.   Psychiatric/Behavioral: Negative.    All other systems reviewed and are negative.  Past Medical History:  Diagnosis Date   Abnormality of gait    Arthritis    Atrial fibrillation (HCC)    BPH (benign prostatic hyperplasia)    Cardiomyopathy (HCC)  Cervical disc disorder    Chicken pox    Closed fracture of one or more phalanges of foot    Clotting disorder (HCC)    low platelets   Combined systolic and diastolic cardiac dysfunction    DDD (degenerative disc disease), cervical     Depression    Elevated PSA    Fracture of foot bone, right, closed 1964   GERD (gastroesophageal reflux disease)    Hammer toe    Hayfever    Hearing loss    Heart murmur    Hyperlipidemia    Neoplasm of uncertain behavior of skin    Pilonidal cyst with abscess    Polyneuropathy    Rheumatic fever    Seasonal allergies    Talipes cavus    Tinnitus     Social History   Socioeconomic History   Marital status: Widowed    Spouse name: Not on file   Number of children: Not on file   Years of education: Not on file   Highest education level: Doctorate  Occupational History   Occupation: Retired  Tobacco Use   Smoking status: Former    Current packs/day: 0.00    Average packs/day: 2.0 packs/day for 9.0 years (18.0 ttl pk-yrs)    Types: Cigarettes    Start date: 35    Quit date: 1969    Years since quitting: 55.9   Smokeless tobacco: Former  Building services engineer status: Never Used  Substance and Sexual Activity   Alcohol use: Yes    Comment: on occasion, maybe 3-4 times per month   Drug use: No   Sexual activity: Not on file  Other Topics Concern   Not on file  Social History Narrative   Lives alone   Right handed   Caffeine: 1-2 cups tea/day and 1 cup coffee some  days   Social Determinants of Health   Financial Resource Strain: Low Risk  (08/21/2022)   Overall Financial Resource Strain (CARDIA)    Difficulty of Paying Living Expenses: Not hard at all  Food Insecurity: No Food Insecurity (08/21/2022)   Hunger Vital Sign    Worried About Running Out of Food in the Last Year: Never true    Ran Out of Food in the Last Year: Never true  Transportation Needs: No Transportation Needs (08/21/2022)   PRAPARE - Administrator, Civil Service (Medical): No    Lack of Transportation (Non-Medical): No  Physical Activity: Sufficiently Active (08/21/2022)   Exercise Vital Sign    Days of Exercise per Week: 5 days    Minutes of Exercise per Session: 30 min   Stress: No Stress Concern Present (08/21/2022)   [employer] of Occupational Health - Occupational Stress Questionnaire    Feeling of Stress : Not at all  Social Connections: Socially Isolated (08/21/2022)   Social Connection and Isolation Panel [NHANES]    Frequency of Communication with Friends and Family: More than three times a week    Frequency of Social Gatherings with Friends and Family: More than three times a week    Attends Religious Services: Never    Database administrator or Organizations: No    Attends Banker Meetings: Never    Marital Status: Widowed  Intimate Partner Violence: Not At Risk (08/21/2022)   Humiliation, Afraid, Rape, and Kick questionnaire    Fear of Current or Ex-Partner: No    Emotionally Abused: No    Physically Abused: No  Sexually Abused: No    Past Surgical History:  Procedure Laterality Date   COLONOSCOPY     2006 normal exam   INGUINAL HERNIA REPAIR     PILONIDAL CYST EXCISION  1966   WISDOM TOOTH EXTRACTION      Family History  Problem Relation Age of Onset   Arthritis Mother    Breast cancer Mother    Arthritis Father    Heart disease Father    Stroke Father    Hypertension Father    Prostate cancer Father    Dementia Father        vascular    Stroke Paternal Grandfather    Stroke Maternal Grandmother    Dementia Paternal Aunt        vascular   Dementia Paternal Uncle        vascular   Colon cancer Neg Hx    Esophageal cancer Neg Hx    Stomach cancer Neg Hx    Rectal cancer Neg Hx     Allergies  Allergen Reactions   Oxybutynin Chloride Other (See Comments)    Current Outpatient Medications on File Prior to Visit  Medication Sig Dispense Refill   acetaminophen (TYLENOL) 325 MG tablet Take by mouth as needed.     atovaquone-proguanil (MALARONE) 250-100 MG TABS tablet Take by mouth.     azithromycin (ZITHROMAX) 250 MG tablet Take by mouth.     Betaine, Trimethylglycine, (TMG, TRIMETHYLGLYCINE, PO)  Take by mouth. Taking 2 capsules     cholecalciferol (VITAMIN D3) 25 MCG (1000 UNIT) tablet Take 1,000 Units by mouth daily.     ibuprofen (ADVIL) 200 MG tablet Take 200 mg by mouth as needed.     ketoconazole (NIZORAL) 2 % shampoo Apply 1 application topically as directed. PRN     loperamide (IMODIUM) 2 MG capsule Take by mouth.     Naproxen Sodium (ALEVE PO) Take by mouth as needed.     triamcinolone cream (KENALOG) 0.1 % Apply 1 application topically daily as needed.     No current facility-administered medications on file prior to visit.    BP 120/80   Pulse 68   Temp 97.8 F (36.6 C) (Oral)   Ht 5\' 11"  (1.803 m)   Wt 194 lb (88 kg)   SpO2 98%   BMI 27.06 kg/m       Objective:   Physical Exam Vitals and nursing note reviewed.  Constitutional:      General: He is not in acute distress.    Appearance: Normal appearance. He is not ill-appearing.  HENT:     Head: Normocephalic and atraumatic.     Right Ear: Tympanic membrane, ear canal and external ear normal. There is no impacted cerumen.     Left Ear: Tympanic membrane, ear canal and external ear normal. There is no impacted cerumen.     Nose: Nose normal. No congestion or rhinorrhea.     Mouth/Throat:     Mouth: Mucous membranes are moist.     Pharynx: Oropharynx is clear.  Eyes:     Extraocular Movements: Extraocular movements intact.     Conjunctiva/sclera: Conjunctivae normal.     Pupils: Pupils are equal, round, and reactive to light.  Neck:     Vascular: No carotid bruit.  Cardiovascular:     Rate and Rhythm: Normal rate and regular rhythm.     Pulses: Normal pulses.     Heart sounds: No murmur heard.    No friction rub. No gallop.  Pulmonary:     Effort: Pulmonary effort is normal.     Breath sounds: Normal breath sounds.  Abdominal:     General: Abdomen is flat. Bowel sounds are normal. There is no distension.     Palpations: Abdomen is soft. There is no mass.     Tenderness: There is no abdominal  tenderness. There is no guarding or rebound.     Hernia: No hernia is present.  Musculoskeletal:        General: Normal range of motion.     Cervical back: Normal range of motion and neck supple.  Lymphadenopathy:     Cervical: No cervical adenopathy.  Skin:    General: Skin is warm and dry.     Capillary Refill: Capillary refill takes less than 2 seconds.  Neurological:     General: No focal deficit present.     Mental Status: He is alert and oriented to person, place, and time.  Psychiatric:        Mood and Affect: Mood normal.        Behavior: Behavior normal.        Thought Content: Thought content normal.        Judgment: Judgment normal.        Assessment & Plan:  1. Routine general medical examination at a health care facility Today patient counseled on age appropriate routine health concerns for screening and prevention, each reviewed and up to date or declined. Immunizations reviewed and up to date or declined. Labs ordered and reviewed. Risk factors for depression reviewed and negative. Hearing function and visual acuity are intact. ADLs screened and addressed as needed. Functional ability and level of safety reviewed and appropriate. Education, counseling and referrals performed based on assessed risks today. Patient provided with a copy of personalized plan for preventive services. - Follow up in one year or sooner if needed - Continue to exercise and eat healthy   2. Chronic diastolic heart failure (HCC) - Euvolemic today.  - Follow up with Cardiology as directed - VITAMIN D 25 Hydroxy (Vit-D Deficiency, Fractures); Future - Lipid panel; Future - TSH; Future - Comprehensive metabolic panel; Future - Comprehensive metabolic panel - Lipid panel - TSH - VITAMIN D 25 Hydroxy (Vit-D Deficiency, Fractures)  3. Hypertrophic cardiomyopathy (HCC) - Asymptomatic - Follow up with Cardiology as directedPer Cardiology  - VITAMIN D 25 Hydroxy (Vit-D Deficiency, Fractures);  Future - Lipid panel; Future - TSH; Future - Comprehensive metabolic panel; Future - Comprehensive metabolic panel - Lipid panel - TSH - VITAMIN D 25 Hydroxy (Vit-D Deficiency, Fractures)  4. Small vessel disease (HCC) - Per Neurology  - VITAMIN D 25 Hydroxy (Vit-D Deficiency, Fractures); Future - Lipid panel; Future - TSH; Future - Comprehensive metabolic panel; Future - Comprehensive metabolic panel - Lipid panel - TSH - VITAMIN D 25 Hydroxy (Vit-D Deficiency, Fractures)  5. Malignant neoplasm of prostate [facility]) - Per Urology  - PSA; Future - PSA  6. Mixed hyperlipidemia - Consider statin  - VITAMIN D 25 Hydroxy (Vit-D Deficiency, Fractures); Future - Lipid panel; Future - TSH; Future - Comprehensive metabolic panel; Future - Comprehensive metabolic panel - Lipid panel - TSH - VITAMIN D 25 Hydroxy (Vit-D Deficiency, Fractures)  Shirline Frees, NP

## 2023-03-13 ENCOUNTER — Encounter: Payer: Self-pay | Admitting: Adult Health

## 2023-03-15 ENCOUNTER — Encounter: Payer: Self-pay | Admitting: Cardiovascular Disease

## 2023-03-25 ENCOUNTER — Encounter: Payer: Self-pay | Admitting: Cardiovascular Disease

## 2023-06-10 ENCOUNTER — Encounter: Payer: Self-pay | Admitting: Adult Health

## 2023-06-10 ENCOUNTER — Telehealth: Payer: Self-pay | Admitting: Adult Health

## 2023-06-10 NOTE — Telephone Encounter (Signed)
 Please advise. Pt Tdap is up to date not due until 2033.

## 2023-06-10 NOTE — Telephone Encounter (Signed)
 Pt asked per MyChart to please let Kandee Keen know that he sent him a message.

## 2023-06-11 ENCOUNTER — Ambulatory Visit (INDEPENDENT_AMBULATORY_CARE_PROVIDER_SITE_OTHER)

## 2023-06-11 ENCOUNTER — Encounter: Payer: Self-pay | Admitting: Adult Health

## 2023-06-11 ENCOUNTER — Ambulatory Visit (INDEPENDENT_AMBULATORY_CARE_PROVIDER_SITE_OTHER): Admitting: Adult Health

## 2023-06-11 VITALS — BP 138/82 | HR 54 | Temp 97.6°F | Wt 193.0 lb

## 2023-06-11 DIAGNOSIS — S91331A Puncture wound without foreign body, right foot, initial encounter: Secondary | ICD-10-CM

## 2023-06-11 MED ORDER — CEPHALEXIN 500 MG PO CAPS
500.0000 mg | ORAL_CAPSULE | Freq: Two times a day (BID) | ORAL | 0 refills | Status: AC
Start: 2023-06-11 — End: 2023-06-21

## 2023-06-11 NOTE — Progress Notes (Signed)
 Subjective:    Patient ID: Marc Thornton, male    DOB: Dec 11, 1945, 78 y.o.   MRN: [id number]  HPI 79 year old male who  has a past medical history of Abnormality of gait, Arthritis, Atrial fibrillation (HCC), BPH (benign prostatic hyperplasia), Bradycardia, Cardiomyopathy (HCC), Cervical disc disorder, Chicken pox, Closed fracture of one or more phalanges of foot, Clotting disorder (HCC), Combined systolic and diastolic cardiac dysfunction, DDD (degenerative disc disease), cervical, Depression, Elevated PSA, Fracture of foot bone, right, closed (1964), GERD (gastroesophageal reflux disease), Hammer toe, Hayfever, Hearing loss, Heart murmur, Hyperlipidemia, Neoplasm of uncertain behavior of skin, Pilonidal cyst with abscess, Polyneuropathy, Rheumatic fever, Seasonal allergies, Talipes cavus, and Tinnitus.  He presents to the office today for an acute issue   Two days ago he was working out side and stepped on likely a nail. That  punctured through the sole of his shoe and into the sole of his foot. When he took his sock off he noticed blood. He did soak his foot in epsom salt. Has some pain at the insertion site.No drainage.   He is up to date on tetanus vaccinations    Review of Systems See HPI   Past Medical History:  Diagnosis Date   Abnormality of gait    Arthritis    Atrial fibrillation (HCC)    BPH (benign prostatic hyperplasia)    Bradycardia    Cardiomyopathy (HCC)    Cervical disc disorder    Chicken pox    Closed fracture of one or more phalanges of foot    Clotting disorder (HCC)    low platelets   Combined systolic and diastolic cardiac dysfunction    DDD (degenerative disc disease), cervical    Depression    Elevated PSA    Fracture of foot bone, right, closed 1964   GERD (gastroesophageal reflux disease)    Hammer toe    Hayfever    Hearing loss    Heart murmur    Hyperlipidemia    Neoplasm of uncertain behavior of skin    Pilonidal cyst with abscess     Polyneuropathy    Rheumatic fever    Seasonal allergies    Talipes cavus    Tinnitus     Social History   Socioeconomic History   Marital status: Widowed    Spouse name: Not on file   Number of children: Not on file   Years of education: Not on file   Highest education level: Doctorate  Occupational History   Occupation: Retired  Tobacco Use   Smoking status: Former    Current packs/day: 0.00    Average packs/day: 2.0 packs/day for 9.0 years (18.0 ttl pk-yrs)    Types: Cigarettes    Start date: 11    Quit date: 1969    Years since quitting: 56.2   Smokeless tobacco: Former  Building services engineer status: Never Used  Substance and Sexual Activity   Alcohol use: Yes    Comment: on occasion, maybe 3-4 times per month   Drug use: No   Sexual activity: Not on file  Other Topics Concern   Not on file  Social History Narrative   Lives alone   Right handed   Caffeine: 1-2 cups tea/day and 1 cup coffee some  days   Social Drivers of Corporate investment banker Strain: Low Risk  (06/11/2023)   Overall Financial Resource Strain (CARDIA)    Difficulty of Paying Living Expenses: Not hard at all  Food Insecurity: No Food Insecurity (06/11/2023)   Hunger Vital Sign    Worried About Running Out of Food in the Last Year: Never true    Ran Out of Food in the Last Year: Never true  Transportation Needs: No Transportation Needs (06/11/2023)   PRAPARE - Administrator, Civil Service (Medical): No    Lack of Transportation (Non-Medical): No  Physical Activity: Insufficiently Active (06/11/2023)   Exercise Vital Sign    Days of Exercise per Week: 4 days    Minutes of Exercise per Session: 30 min  Stress: No Stress Concern Present (06/11/2023)   [employer] of Occupational Health - Occupational Stress Questionnaire    Feeling of Stress : Not at all  Social Connections: Socially Isolated (06/11/2023)   Social Connection and Isolation Panel [NHANES]    Frequency of  Communication with Friends and Family: Three times a week    Frequency of Social Gatherings with Friends and Family: More than three times a week    Attends Religious Services: Never    Database administrator or Organizations: No    Attends Banker Meetings: Never    Marital Status: Widowed  Intimate Partner Violence: Not At Risk (08/21/2022)   Humiliation, Afraid, Rape, and Kick questionnaire    Fear of Current or Ex-Partner: No    Emotionally Abused: No    Physically Abused: No    Sexually Abused: No    Past Surgical History:  Procedure Laterality Date   COLONOSCOPY     2006 normal exam   INGUINAL HERNIA REPAIR     PILONIDAL CYST EXCISION  1966   WISDOM TOOTH EXTRACTION      Family History  Problem Relation Age of Onset   Arthritis Mother    Breast cancer Mother    Arthritis Father    Heart disease Father    Stroke Father    Hypertension Father    Prostate cancer Father    Dementia Father        vascular    Stroke Paternal Grandfather    Stroke Maternal Grandmother    Dementia Paternal Aunt        vascular   Dementia Paternal Uncle        vascular   Colon cancer Neg Hx    Esophageal cancer Neg Hx    Stomach cancer Neg Hx    Rectal cancer Neg Hx     Allergies  Allergen Reactions   Oxybutynin Chloride Other (See Comments)    Current Outpatient Medications on File Prior to Visit  Medication Sig Dispense Refill   acetaminophen (TYLENOL) 325 MG tablet Take by mouth as needed.     atovaquone-proguanil (MALARONE) 250-100 MG TABS tablet Take by mouth.     azithromycin (ZITHROMAX) 250 MG tablet Take by mouth.     Betaine, Trimethylglycine, (TMG, TRIMETHYLGLYCINE, PO) Take by mouth. Taking 2 capsules     cholecalciferol (VITAMIN D3) 25 MCG (1000 UNIT) tablet Take 1,000 Units by mouth daily.     ibuprofen (ADVIL) 200 MG tablet Take 200 mg by mouth as needed.     ketoconazole (NIZORAL) 2 % shampoo Apply 1 application topically as directed. PRN      loperamide (IMODIUM) 2 MG capsule Take by mouth.     Naproxen Sodium (ALEVE PO) Take by mouth as needed.     triamcinolone cream (KENALOG) 0.1 % Apply 1 application topically daily as needed.     No current facility-administered medications on file  prior to visit.    BP 138/82   Pulse (!) 54   Temp 97.6 F (36.4 C) (Oral)   Wt 193 lb (87.5 kg)   SpO2 98%   BMI 26.92 kg/m       Objective:   Physical Exam Vitals and nursing note reviewed.  Constitutional:      Appearance: Normal appearance.  Pulmonary:     Breath sounds: Normal breath sounds.  Musculoskeletal:       Feet:  Skin:    General: Skin is warm and dry.  Neurological:     General: No focal deficit present.     Mental Status: He is alert and oriented to person, place, and time.  Psychiatric:        Mood and Affect: Mood normal.        Behavior: Behavior normal.        Thought Content: Thought content normal.        Judgment: Judgment normal.       Assessment & Plan:  1. Puncture wound of right foot, initial encounter (Primary) - Will get xray today to look for foreign body. Start on Keflex BID x 10 days - Follow up as needed - cephALEXin (KEFLEX) 500 MG capsule; Take 1 capsule (500 mg total) by mouth 2 (two) times daily for 10 days.  Dispense: 20 capsule; Refill: 0 - DG Foot Complete Right; Future  Shirline Frees, NP

## 2023-06-11 NOTE — Telephone Encounter (Signed)
 FYI

## 2023-06-12 NOTE — Telephone Encounter (Signed)
 FYI

## 2023-06-27 ENCOUNTER — Encounter: Payer: Self-pay | Admitting: Adult Health

## 2023-06-27 NOTE — Telephone Encounter (Signed)
 FYI

## 2023-07-04 ENCOUNTER — Other Ambulatory Visit: Payer: Self-pay | Admitting: Adult Health

## 2023-07-04 DIAGNOSIS — M79671 Pain in right foot: Secondary | ICD-10-CM

## 2023-07-17 ENCOUNTER — Ambulatory Visit (INDEPENDENT_AMBULATORY_CARE_PROVIDER_SITE_OTHER): Admitting: Orthopedic Surgery

## 2023-07-17 DIAGNOSIS — M79671 Pain in right foot: Secondary | ICD-10-CM

## 2023-07-24 ENCOUNTER — Telehealth: Payer: Self-pay | Admitting: Adult Health

## 2023-07-24 NOTE — Telephone Encounter (Signed)
 Patient canceled his AWV.  He is not interested in having any AWV appts.

## 2023-07-29 LAB — LAB REPORT - SCANNED
A1c: 5.6
EGFR: 73

## 2023-07-31 ENCOUNTER — Ambulatory Visit: Payer: No Typology Code available for payment source | Admitting: Family Medicine

## 2023-08-10 ENCOUNTER — Encounter: Payer: Self-pay | Admitting: Orthopedic Surgery

## 2023-08-10 NOTE — Progress Notes (Signed)
 Office Visit Note   Patient: Marc Thornton           Date of Birth: 11-28-1945           MRN: [id number] Visit Date: 07/17/2023              Requested by: Alto Atta, NP [address] [place],  [place] [address] PCP: Alto Atta, NP  Chief Complaint  Patient presents with   Right Foot - Pain      HPI: The patient is a 78 year old gentleman who presents with right foot pain.  Patient states he had a metatarsal fracture in 1966.  He states he has a knot on the top of his foot that rubs in his shoes.  Patient has had inserts from [employer] to help with his foot deformity.  Assessment & Plan: Visit Diagnoses:  1. Pain in right foot     Plan: Discussed treatment options including fusion of the base of the first metatarsal with dorsiflexion osteotomy versus continued orthotics.  Patient will follow-up as needed.  Follow-Up Instructions: Return if symptoms worsen or fail to improve.   Ortho Exam  Patient is alert, oriented, no adenopathy, well-dressed, normal affect, normal respiratory effort. Examination patient has a strong palpable dorsalis pedis pulse.  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  He does have custom orthotics with the first metatarsal head cut out to get his foot out of varus.  Review of the radiographs shows bone spurs at the Achilles insertion as well as a plantar heel spur.  Patient has a large bone deformities of the base of the first metatarsal medial cuneiform with old fracture through the base of the medial column.  Patient is not diabetic but does have neuropathy.  Imaging: No results found. No images are attached to the encounter.  Labs: Lab Results  Component Value Date   HGBA1C 5.4 03/06/2021   HGBA1C 5.3 03/01/2020   HGBA1C 5.5 11/27/2018   ESRSEDRATE 16 08/19/2019   CRP <1 08/19/2019     Lab Results  Component Value Date   ALBUMIN 4.1 03/12/2023   ALBUMIN 4.6 03/07/2022   ALBUMIN 4.6 03/06/2021    No  results found for: "MG" Lab Results  Component Value Date   VD25OH 30.41 03/12/2023   VD25OH 35.02 03/07/2022   VD25OH 25.6 (L) 08/19/2019    No results found for: "PREALBUMIN"    Latest Ref Rng & Units 02/12/2023    3:57 PM 03/07/2022    1:49 PM 03/06/2021    1:57 PM  CBC EXTENDED  WBC 4.0 - 10.5 K/uL 8.0  6.8  6.5   RBC 4.22 - 5.81 Mil/uL 4.52  4.84  4.96   Hemoglobin 13.0 - 17.0 g/dL 62.9  52.8  41.3   HCT 39.0 - 52.0 % 42.4  45.8  46.5   Platelets 150.0 - 400.0 K/uL 228.0  153.0  159.0   NEUT# 1.4 - 7.7 K/uL 3.7  3.4  2.9   Lymph# 0.7 - 4.0 K/uL 3.1  2.6  2.7      There is no height or weight on file to calculate BMI.  Orders:  No orders of the defined types were placed in this encounter.  No orders of the defined types were placed in this encounter.    Procedures: No procedures performed  Clinical Data: No additional findings.  ROS:  All other systems negative, except as noted in the HPI. Review of Systems  Objective: Vital  Signs: There were no vitals taken for this visit.  Specialty Comments:  No specialty comments available.  PMFS History: Patient Active Problem List   Diagnosis Date Noted   Malignant neoplasm of prostate (HCC) 04/19/2022   Bilateral carpal tunnel syndrome 08/18/2020   Midline low back pain without sciatica 09/30/2019   Atrophy of muscle of right lower leg 09/13/2019   Lumbar radiculopathy 09/13/2019   Peripheral neuropathy 09/13/2019   Gait abnormality 08/19/2019   Paresthesia 08/19/2019   Neck pain 08/19/2019   Memory loss 08/19/2019   Chronic diastolic heart failure (HCC) 03/21/2016   Murmur, cardiac 01/15/2016   Dyspnea 01/15/2016   Abnormal ECG 01/15/2016   Hypertriglyceridemia 01/15/2016   Past Medical History:  Diagnosis Date   Abnormality of gait    Arthritis    Atrial fibrillation (HCC)    BPH (benign prostatic hyperplasia)    Bradycardia    Cardiomyopathy (HCC)    Cervical disc disorder    Chicken pox     Closed fracture of one or more phalanges of foot    Clotting disorder (HCC)    low platelets   Combined systolic and diastolic cardiac dysfunction    DDD (degenerative disc disease), cervical    Depression    Elevated PSA    Fracture of foot bone, right, closed 1964   GERD (gastroesophageal reflux disease)    Hammer toe    Hayfever    Hearing loss    Heart murmur    Hyperlipidemia    Neoplasm of uncertain behavior of skin    Pilonidal cyst with abscess    Polyneuropathy    Rheumatic fever    Seasonal allergies    Talipes cavus    Tinnitus     Family History  Problem Relation Age of Onset   Arthritis Mother    Breast cancer Mother    Arthritis Father    Heart disease Father    Stroke Father    Hypertension Father    Prostate cancer Father    Dementia Father        vascular    Stroke Paternal Grandfather    Stroke Maternal Grandmother    Dementia Paternal Aunt        vascular   Dementia Paternal Uncle        vascular   Colon cancer Neg Hx    Esophageal cancer Neg Hx    Stomach cancer Neg Hx    Rectal cancer Neg Hx     Past Surgical History:  Procedure Laterality Date   COLONOSCOPY     2006 normal exam   INGUINAL HERNIA REPAIR     PILONIDAL CYST EXCISION  1966   WISDOM TOOTH EXTRACTION     Social History   Occupational History   Occupation: Retired  Tobacco Use   Smoking status: Former    Current packs/day: 0.00    Average packs/day: 2.0 packs/day for 9.0 years (18.0 ttl pk-yrs)    Types: Cigarettes    Start date: 9    Quit date: 1969    Years since quitting: 56.3   Smokeless tobacco: Former  Building services engineer status: Never Used  Substance and Sexual Activity   Alcohol use: Yes    Comment: on occasion, maybe 3-4 times per month   Drug use: No   Sexual activity: Not on file

## 2023-09-15 ENCOUNTER — Telehealth: Payer: Self-pay | Admitting: Cardiovascular Disease

## 2023-09-15 NOTE — Telephone Encounter (Signed)
 Pt called to report that he is being denied VA compensation for agent orange exposure and Cardiomyopathy... he says they are "hanging" on his Nuc Med stress test from 02/28/2016 that was read by Dr Alroy Aspen to deny his compensation.... at that time it concluded No Ischemia... but he is saying that looking at the images.... we had "missed apical ischemia"...   He is asking Dr Alroy Aspen to note that he had re-evaluation the images and in light of new information on recent PET CT showing ischemia... that he concludes that there is Ischemia..   I reassured him that Dr Alroy Aspen is not legally at lib to "change" medical records".. he says that he is not asking to change but ot note that he "re-reviewed the information for that time.   He says that he was able to review the images and he believes there was ischemia present at that time.   I advised him that I will have to send to Dr Alroy Aspen for his review.

## 2023-09-15 NOTE — Telephone Encounter (Signed)
 Patient calling in to get Dr. Floria Hurst to fix a dx code that he put on his record from 2017. Patient states that he has ischemia, and scan had showed that when it was done.  Patient is looking for it to be corrected and want a call back for Dr. Floria Hurst. Please advise

## 2023-09-22 ENCOUNTER — Encounter: Payer: Self-pay | Admitting: Cardiovascular Disease

## 2023-09-22 NOTE — Telephone Encounter (Signed)
 Please review and advise.

## 2023-10-09 DIAGNOSIS — R972 Elevated prostate specific antigen [PSA]: Secondary | ICD-10-CM | POA: Diagnosis not present

## 2023-10-09 DIAGNOSIS — C61 Malignant neoplasm of prostate: Secondary | ICD-10-CM | POA: Diagnosis not present

## 2023-12-12 ENCOUNTER — Encounter: Payer: Self-pay | Admitting: Cardiovascular Disease

## 2023-12-31 ENCOUNTER — Other Ambulatory Visit: Payer: Self-pay

## 2023-12-31 ENCOUNTER — Encounter: Payer: Self-pay | Admitting: Adult Health

## 2023-12-31 ENCOUNTER — Encounter: Payer: Self-pay | Admitting: Physical Therapy

## 2023-12-31 ENCOUNTER — Ambulatory Visit (INDEPENDENT_AMBULATORY_CARE_PROVIDER_SITE_OTHER): Admitting: Adult Health

## 2023-12-31 ENCOUNTER — Ambulatory Visit: Attending: Adult Health | Admitting: Physical Therapy

## 2023-12-31 VITALS — BP 120/78 | HR 58 | Temp 98.1°F | Ht 71.0 in | Wt 183.0 lb

## 2023-12-31 DIAGNOSIS — M79605 Pain in left leg: Secondary | ICD-10-CM | POA: Insufficient documentation

## 2023-12-31 DIAGNOSIS — M25552 Pain in left hip: Secondary | ICD-10-CM | POA: Diagnosis not present

## 2023-12-31 DIAGNOSIS — R490 Dysphonia: Secondary | ICD-10-CM

## 2023-12-31 DIAGNOSIS — R293 Abnormal posture: Secondary | ICD-10-CM | POA: Insufficient documentation

## 2023-12-31 DIAGNOSIS — M6281 Muscle weakness (generalized): Secondary | ICD-10-CM | POA: Diagnosis not present

## 2023-12-31 NOTE — Progress Notes (Signed)
 Subjective:    Patient ID: Marc Thornton, male    DOB: 05-Jun-1945, 78 y.o.   MRN: [id number]  HPI  78 year old male who  has a past medical history of Abnormality of gait, Arthritis, Atrial fibrillation (HCC), BPH (benign prostatic hyperplasia), Bradycardia, Cardiomyopathy (HCC), Cervical disc disorder, Chicken pox, Closed fracture of one or more phalanges of foot, Clotting disorder, Combined systolic and diastolic cardiac dysfunction, DDD (degenerative disc disease), cervical, Depression, Elevated PSA, Fracture of foot bone, right, closed (1964), GERD (gastroesophageal reflux disease), Hammer toe, Hayfever, Hearing loss, Heart murmur, Hyperlipidemia, Neoplasm of uncertain behavior of skin, Pilonidal cyst with abscess, Polyneuropathy, Rheumatic fever, Seasonal allergies, Talipes cavus, and Tinnitus.  He presents to the office today for multiple issues  Hoarseness/weak voice - reports that he is not sure how long it has been present but over the last six weeks he feels as though  it has been getting worse, like my voice is getting weaker. He denies pain but does report some discomfort.  Denies issues with swallowing. He has noticed that he is cleaning his throat more often. Warm drinks do not make a difference in voice quality.  Left leg pain - has been ongoing for atleast a few weeks. Pain is located along the lateral aspect of his hip into the buttock and down the outside of his left leg to his knee. He is able to walk every day for exercise and the pain improves after exercise. 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 No discrete fracture line identified. Correlate withsymptoms and consider dedicated radiographs for further evaluation.  He did reach out to Dr. Hiram who told him that if he was having pain in his left hip  to come in and be seen. He is concerned about rheumatoid disease     Review of Systems See HPI   Past Medical History:  Diagnosis Date   Abnormality of gait    Arthritis    Atrial fibrillation (HCC)    BPH (benign prostatic hyperplasia)    Bradycardia    Cardiomyopathy (HCC)    Cervical disc disorder    Chicken pox    Closed fracture of one or more phalanges of foot    Clotting disorder    low platelets   Combined systolic and diastolic cardiac dysfunction    DDD (degenerative disc disease), cervical    Depression    Elevated PSA    Fracture of foot bone, right, closed 1964   GERD (gastroesophageal reflux disease)    Hammer toe    Hayfever    Hearing loss    Heart murmur    Hyperlipidemia    Neoplasm of uncertain behavior of skin    Pilonidal cyst with abscess    Polyneuropathy    Rheumatic fever    Seasonal allergies    Talipes cavus    Tinnitus     Social History   Socioeconomic History   Marital status: Widowed    Spouse name: Not on file   Number of children: Not on file   Years of education: Not on file   Highest education level: Professional school degree (e.g., MD, DDS, DVM, JD)  Occupational History   Occupation: Retired  Tobacco Use   Smoking status: Former    Current packs/day: 0.00    Average packs/day: 2.0  packs/day for 9.0 years (18.0 ttl pk-yrs)    Types: Cigarettes    Start date: 60    Quit date: 55    Years since quitting: 70.7   Smokeless tobacco: Former  Building services engineer status: Never Used  Substance and Sexual Activity   Alcohol use: Yes    Comment: on occasion, maybe 3-4 times per month   Drug use: No   Sexual activity: Not on file  Other Topics Concern   Not on file  Social History Narrative   Lives alone   Right handed   Caffeine: 1-2 cups tea/day and 1 cup coffee some  days   Social Drivers of Corporate investment banker Strain: Low Risk  (12/29/2023)   Overall Financial Resource Strain (CARDIA)    Difficulty of  Paying Living Expenses: Not hard at all  Food Insecurity: No Food Insecurity (12/29/2023)   Hunger Vital Sign    Worried About Running Out of Food in the Last Year: Never true    Ran Out of Food in the Last Year: Never true  Transportation Needs: No Transportation Needs (12/29/2023)   PRAPARE - Administrator, Civil Service (Medical): No    Lack of Transportation (Non-Medical): No  Physical Activity: Sufficiently Active (12/29/2023)   Exercise Vital Sign    Days of Exercise per Week: 5 days    Minutes of Exercise per Session: 30 min  Stress: No Stress Concern Present (12/29/2023)   [employer] of Occupational Health - Occupational Stress Questionnaire    Feeling of Stress: Only a little  Social Connections: Socially Isolated (12/29/2023)   Social Connection and Isolation Panel    Frequency of Communication with Friends and Family: More than three times a week    Frequency of Social Gatherings with Friends and Family: More than three times a week    Attends Religious Services: Never    Database administrator or Organizations: No    Attends Banker Meetings: Not on file    Marital Status: Widowed  Intimate Partner Violence: Not At Risk (08/21/2022)   Humiliation, Afraid, Rape, and Kick questionnaire    Fear of Current or Ex-Partner: No    Emotionally Abused: No    Physically Abused: No    Sexually Abused: No    Past Surgical History:  Procedure Laterality Date   COLONOSCOPY     2006 normal exam   INGUINAL HERNIA REPAIR     PILONIDAL CYST EXCISION  1966   WISDOM TOOTH EXTRACTION      Family History  Problem Relation Age of Onset   Arthritis Mother    Breast cancer Mother    Arthritis Father    Heart disease Father    Stroke Father    Hypertension Father    Prostate cancer Father    Dementia Father        vascular    Stroke Paternal Grandfather    Stroke Maternal Grandmother    Dementia Paternal Aunt        vascular   Dementia Paternal Uncle         vascular   Colon cancer Neg Hx    Esophageal cancer Neg Hx    Stomach cancer Neg Hx    Rectal cancer Neg Hx     Allergies  Allergen Reactions   Metoprolol  Other (See Comments)   Oxybutynin Chloride Other (See Comments)    Current Outpatient Medications on File Prior to Visit  Medication Sig  Dispense Refill   acetaminophen (TYLENOL) 325 MG tablet Take by mouth as needed.     Betaine, Trimethylglycine, (TMG, TRIMETHYLGLYCINE, PO) Take by mouth. Taking 2 capsules     cholecalciferol (VITAMIN D3) 25 MCG (1000 UNIT) tablet Take 1,000 Units by mouth daily.     ibuprofen (ADVIL) 200 MG tablet Take 200 mg by mouth as needed.     ketoconazole (NIZORAL) 2 % shampoo Apply 1 application topically as directed. PRN     Naproxen Sodium (ALEVE PO) Take by mouth as needed.     silver sulfADIAZINE (SILVADENE) 1 % cream Apply topically.     triamcinolone  cream (KENALOG) 0.1 % Apply 1 application topically daily as needed.     No current facility-administered medications on file prior to visit.    BP 120/78   Pulse (!) 58   Temp 98.1 F (36.7 C) (Oral)   Ht 5' 11 (1.803 m)   Wt 183 lb (83 kg)   SpO2 95%   BMI 25.52 kg/m       Objective:   Physical Exam Vitals and nursing note reviewed.  Constitutional:      Appearance: Normal appearance.  HENT:     Nose: Nose normal. No congestion or rhinorrhea.     Mouth/Throat:     Mouth: Mucous membranes are moist.     Pharynx: Oropharynx is clear. No oropharyngeal exudate or posterior oropharyngeal erythema.     Comments: + weak voice  Cardiovascular:     Rate and Rhythm: Normal rate and regular rhythm.     Pulses: Normal pulses.     Heart sounds: Normal heart sounds.  Musculoskeletal:     Left upper leg: Tenderness present. No swelling.     Comments: Discomfort with palpation along left sided IT band   Skin:    General: Skin is warm and dry.  Neurological:     General: No focal deficit present.     Mental Status: He is alert and  oriented to person, place, and time.  Psychiatric:        Mood and Affect: Mood normal.        Behavior: Behavior normal.        Thought Content: Thought content normal.        Judgment: Judgment normal.        Assessment & Plan:  1. Hoarseness (Primary) - Due to length of symptoms will refer to ENT  - Ambulatory referral to ENT  2. Left leg pain - Not concerned about rheumatoid arthritis. Likely partially due to arthritis in his left hip but most of his pain seems to be coming from IT band. Will refer to PT. He can follow up with orthopedics as needed  - Ambulatory referral to Physical Therapy  Darleene Shape, NP   I personally spent a total of 31 minutes in the care of the patient today including preparing to see the patient, getting/reviewing separately obtained history, performing a medically appropriate exam/evaluation, counseling and educating, placing orders, and documenting clinical information in the EHR.

## 2023-12-31 NOTE — Therapy (Signed)
 OUTPATIENT PHYSICAL THERAPY LOWER EXTREMITY EVALUATION   [name] MRN: [id number] DOB:[birth year], 78 y.o., male Today's Date: 12/31/2023  END OF SESSION:  PT End of Session - 12/31/23 1733     Visit Number 1    Date for Recertification  02/25/24    Authorization Type [employer] MCR gelene requested 16 visits)    Progress Note Due on Visit 10    PT Start Time 1624    PT Stop Time 1703    PT Time Calculation (min) 39 min    Activity Tolerance Patient tolerated treatment well    Behavior During Therapy WFL for tasks assessed/performed          Past Medical History:  Diagnosis Date   Abnormality of gait    Arthritis    Atrial fibrillation (HCC)    BPH (benign prostatic hyperplasia)    Bradycardia    Cardiomyopathy (HCC)    Cervical disc disorder    Chicken pox    Closed fracture of one or more phalanges of foot    Clotting disorder    low platelets   Combined systolic and diastolic cardiac dysfunction    DDD (degenerative disc disease), cervical    Depression    Elevated PSA    Fracture of foot bone, right, closed 1964   GERD (gastroesophageal reflux disease)    Hammer toe    Hayfever    Hearing loss    Heart murmur    Hyperlipidemia    Neoplasm of uncertain behavior of skin    Pilonidal cyst with abscess    Polyneuropathy    Rheumatic fever    Seasonal allergies    Talipes cavus    Tinnitus    Past Surgical History:  Procedure Laterality Date   COLONOSCOPY     2006 normal exam   INGUINAL HERNIA REPAIR     PILONIDAL CYST EXCISION  1966   WISDOM TOOTH EXTRACTION     Patient Active Problem List   Diagnosis Date Noted   Malignant neoplasm of prostate (HCC) 04/19/2022   Bilateral carpal tunnel syndrome 08/18/2020   Midline low back pain without sciatica 09/30/2019   Atrophy of muscle of right lower leg 09/13/2019   Lumbar radiculopathy 09/13/2019   Peripheral neuropathy 09/13/2019   Gait abnormality 08/19/2019   Paresthesia 08/19/2019    Neck pain 08/19/2019   Memory loss 08/19/2019   Chronic diastolic heart failure (HCC) 03/21/2016   Murmur, cardiac 01/15/2016   Dyspnea 01/15/2016   Abnormal ECG 01/15/2016   Hypertriglyceridemia 01/15/2016    PCP: Merna Huxley, NP  REFERRING PROVIDER: Merna Huxley, NP  REFERRING DIAG: [phone] (ICD-10-CM) - Left leg pain   THERAPY DIAG:  Pain in left hip  Muscle weakness (generalized)  Abnormal posture  Rationale for Evaluation and Treatment: Rehabilitation  ONSET DATE: 3-4 months ago  SUBJECTIVE:   SUBJECTIVE STATEMENT: Patient presents with left hip pain that is usually worse in the mornings. He went to Dr. 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 He reports Dr. Hiram said he does not need a hip replacement now, but he will in the future. He went on two trips to [place] and the required a lot of sitting. He struggled to sit in the very low chairs and he would like to be able to do that. He walks 5x a week with a group for 45 mins.   PERTINENT HISTORY: Peripheral neuropathy ; OA; Depression; DDD; Hx low back pain PAIN:  Are you having pain? Yes: NPRS scale: 3-4(currently) 6-7(worst) / 10 Pain location: front and side of his left quad Pain description: achy Aggravating factors: getting out of bed; walking;  Relieving factors: stretching   PRECAUTIONS: None  RED FLAGS: None   WEIGHT BEARING RESTRICTIONS: No  FALLS:  Has patient fallen in last 6 months? No  LIVING ENVIRONMENT: Lives with: lives alone Lives in: House/apartment Stairs: No Has following equipment at home: He has a cane, walker, shower chair, but he does not use anything  OCCUPATION: Retired   PLOF: Independent, Independent with basic ADLs, Independent with household mobility without device, Independent with community mobility without device, Independent with gait, Independent with transfers, and Leisure: Working in the yard, garden , working on cars  PATIENT GOALS: To  get rid of pain  NEXT MD VISIT: PRN  OBJECTIVE:  Note: Objective measures were completed at Evaluation unless otherwise noted.  DIAGNOSTIC FINDINGS: Patient reports Lt hip arthritis (Dr. Hiram)   PATIENT SURVEYS:  LEFS: 56/80 70%   COGNITION: Overall cognitive status: Within functional limits for tasks assessed     SENSATION: Patient had peripheral neuropathy   MUSCLE LENGTH: Hamstrings: decreased bilateral Lt > Rt    POSTURE: rounded shoulders, forward head, increased thoracic kyphosis, and weight shift right  PALPATION: Tenderness around Lt hip joint. Patient has a deep achy pain in left hip pain.  LOWER EXTREMITY ROM: Left hip PROM limited in all directions  Lt >Rt  ; patient unable to achieve FABER position on left. Patient has pain at end ranges.  LOWER EXTREMITY MMT: Grossly 4+/5 on right; 4 to 4+/5 on Lt some pain with left hip abduction testing;   FUNCTIONAL TESTS:  5 times sit to stand: 9.18 sec no UE support;  uncontrolled descend; anterior LOB requiring stepping strategy   Timed up and go (TUG): 5.00 sec   GAIT:  Comments: Right lateral trunk lean; antalgic gait                                                                                                                               TREATMENT DATE: 12/31/2023 Initial Evaluation & HEP created    PATIENT EDUCATION:  Education details: PT eval findings, anticipated POC, progress with PT, and initial HEP Person educated: Patient Education method: Explanation, Demonstration, and Handouts Education comprehension: verbalized understanding, returned demonstration, and needs further education  HOME EXERCISE PROGRAM: Access Code: [email or web address] URL: [email or web address] Date: 12/31/2023 Prepared by: Kristeen Sar  Exercises - Supine Hip Internal and External Rotation  - 1 x daily - 7 x weekly - 1 sets - 10 reps - 5 hold - Seated Hamstring Stretch  - 1 x daily - 7 x weekly - 2 sets - 10 reps -  20-30s hold - Quadricep Stretch with Chair and Counter Support  - 1 x daily - 7 x weekly - 2 sets - 20-30 hold - Supine ITB Stretch with Strap  - 1 x  daily - 7 x weekly - 2 sets - 20-30 hold - Supine Hamstring Stretch with Strap  - 1 x daily - 7 x weekly - 2 sets - 20-30s hold  ASSESSMENT:  CLINICAL IMPRESSION: Patient is a 78 y.o. male who was seen today for physical therapy evaluation and treatment for left hip pain. Aniel presents to skilled therapy with chronic left hip pain that recently flared up 3-4 months ago. He has pain instantly in the mornings when he gets up and it usually goes away with movement, but recently he has not gotten any relief. He walks 5x a week for 45 mins with a walking group. He enjoys gardening and doing yard work but his left hip gives him some pain but he pushes through it. His sleep is greatly disturbed by his left hip pain. Based on evaluation noted muscle weakness, decreased ROM, poor posture, and decreased flexibility. Patient is motivated and wants to be educated on how to manage pain. Patient will benefit from skilled PT to address the below impairments and improve overall function.   OBJECTIVE IMPAIRMENTS: Abnormal gait, decreased balance, decreased coordination, decreased mobility, difficulty walking, decreased ROM, decreased strength, hypomobility, impaired perceived functional ability, increased muscle spasms, impaired flexibility, impaired sensation, improper body mechanics, postural dysfunction, and pain.   ACTIVITY LIMITATIONS: bending, sitting, standing, squatting, sleeping, stairs, dressing, hygiene/grooming, and locomotion level  PARTICIPATION LIMITATIONS: meal prep, cleaning, laundry, interpersonal relationship, community activity, and yard work  PERSONAL FACTORS: Age and 1-2 comorbidities: OA; peripheral neuropathy  are also affecting patient's functional outcome.   REHAB POTENTIAL: Good  CLINICAL DECISION MAKING: Evolving/moderate  complexity  EVALUATION COMPLEXITY: Moderate   GOALS: Goals reviewed with patient? Yes  SHORT TERM GOALS: Target date: 01/28/2024  Patient will be independent with initial HEP. Baseline:  Goal status: INITIAL  2.  Patient will report > or = to 30% improvement in sleep quality since starting PT. Baseline:  Goal status: INITIAL  3.  Patient will be able to perform 5STS test with no losses of balance. Baseline: anterior LOB Goal status: INITIAL  4.  Patient will be able to participate in  to establish baseline for functional test.  Baseline:  Goal status: INITIAL   LONG TERM GOALS: Target date: 02/25/2024  Patient will demonstrate independence in advanced HEP. Baseline:  Goal status: INITIAL  2.  Patient will report > or = to 50% improvement in left hip pain since starting PT. Baseline:  Goal status: INITIAL  3.  Patient will be able to garden with < or = to 3/10 left hip pain. Baseline:  Goal status: INITIAL  4.  Patient will demonstrate at least 4+/5 strength on left for improved performance of sit to stand and community negotiation. Baseline:  Goal status: INITIAL  5.  Patient will score > or = to 66/80 on LEFS due to improved function and decreased disability. Baseline: 56/80  Goal status: INITIAL    PLAN:  PT FREQUENCY: 2x/week  PT DURATION: 8 weeks  PLANNED INTERVENTIONS: 97164- PT Re-evaluation, 97110-Therapeutic exercises, 97530- Therapeutic activity, 97112- Neuromuscular re-education, 97535- Self Care, 02859- Manual therapy, [id number]- Gait training, [phone]- Canalith repositioning, [id number]- Aquatic Therapy, [phone]- Electrical stimulation (unattended), [phone]- Electrical stimulation (manual), [id number]- Vasopneumatic device, [id number]- Ultrasound, [id number]- Traction (mechanical), [id number]- Ionotophoresis 4mg /ml Dexamethasone, 79439 (1-2 muscles), 20561 (3+ muscles)- Dry Needling, Patient/Family education, Balance training, Stair training, Taping, Joint mobilization, Joint  manipulation, Spinal manipulation, Spinal mobilization, Vestibular training, Cryotherapy, and Moist heat  PLAN FOR NEXT SESSION: Review HEP; hip  flexibility; postural strengthening; gait mechanics; ; NuStep   Kristeen Sar, PT 12/31/23 5:34 PM [facility] [address], [address] [place], [place] [address] Phone # [phone] Fax [phone]

## 2024-01-05 ENCOUNTER — Encounter (INDEPENDENT_AMBULATORY_CARE_PROVIDER_SITE_OTHER): Payer: Self-pay

## 2024-01-06 ENCOUNTER — Encounter: Payer: Self-pay | Admitting: Physical Therapy

## 2024-01-06 ENCOUNTER — Ambulatory Visit: Admitting: Physical Therapy

## 2024-01-06 DIAGNOSIS — R293 Abnormal posture: Secondary | ICD-10-CM

## 2024-01-06 DIAGNOSIS — M6281 Muscle weakness (generalized): Secondary | ICD-10-CM | POA: Diagnosis not present

## 2024-01-06 DIAGNOSIS — M25552 Pain in left hip: Secondary | ICD-10-CM | POA: Diagnosis not present

## 2024-01-06 DIAGNOSIS — M79605 Pain in left leg: Secondary | ICD-10-CM | POA: Diagnosis not present

## 2024-01-06 NOTE — Therapy (Signed)
 OUTPATIENT PHYSICAL THERAPY LOWER EXTREMITY TREATMENT NOTE   [name] MRN: [id number] DOB:[birth year], 78 y.o., male Today's Date: 01/06/2024  END OF SESSION:  PT End of Session - 01/06/24 1451     Visit Number 2    Date for Recertification  02/25/24    Authorization Type [employer] Approved 16 vls 12/31/23-02/26/24    Authorization - Visit Number 1    Authorization - Number of Visits 16    Progress Note Due on Visit 10    PT Start Time 1445    PT Stop Time 1532    PT Time Calculation (min) 47 min    Activity Tolerance Patient tolerated treatment well    Behavior During Therapy WFL for tasks assessed/performed           Past Medical History:  Diagnosis Date   Abnormality of gait    Arthritis    Atrial fibrillation (HCC)    BPH (benign prostatic hyperplasia)    Bradycardia    Cardiomyopathy (HCC)    Cervical disc disorder    Chicken pox    Closed fracture of one or more phalanges of foot    Clotting disorder    low platelets   Combined systolic and diastolic cardiac dysfunction    DDD (degenerative disc disease), cervical    Depression    Elevated PSA    Fracture of foot bone, right, closed 1964   GERD (gastroesophageal reflux disease)    Hammer toe    Hayfever    Hearing loss    Heart murmur    Hyperlipidemia    Neoplasm of uncertain behavior of skin    Pilonidal cyst with abscess    Polyneuropathy    Rheumatic fever    Seasonal allergies    Talipes cavus    Tinnitus    Past Surgical History:  Procedure Laterality Date   COLONOSCOPY     2006 normal exam   INGUINAL HERNIA REPAIR     PILONIDAL CYST EXCISION  1966   WISDOM TOOTH EXTRACTION     Patient Active Problem List   Diagnosis Date Noted   Malignant neoplasm of prostate (HCC) 04/19/2022   Bilateral carpal tunnel syndrome 08/18/2020   Midline low back pain without sciatica 09/30/2019   Atrophy of muscle of right lower leg 09/13/2019   Lumbar radiculopathy 09/13/2019   Peripheral  neuropathy 09/13/2019   Gait abnormality 08/19/2019   Paresthesia 08/19/2019   Neck pain 08/19/2019   Memory loss 08/19/2019   Chronic diastolic heart failure (HCC) 03/21/2016   Murmur, cardiac 01/15/2016   Dyspnea 01/15/2016   Abnormal ECG 01/15/2016   Hypertriglyceridemia 01/15/2016    PCP: Merna Huxley, NP  REFERRING PROVIDER: Merna Huxley, NP  REFERRING DIAG: [phone] (ICD-10-CM) - Left leg pain   THERAPY DIAG:  Pain in left hip  Muscle weakness (generalized)  Abnormal posture  Rationale for Evaluation and Treatment: Rehabilitation  ONSET DATE: 3-4 months ago  SUBJECTIVE:   SUBJECTIVE STATEMENT: My hip felt better with the exercises but I've aggravated some of my LBP.  I can work through it.  Eval: Patient presents with left hip pain that is usually worse in the mornings. He went to Dr. 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 He reports Dr. Hiram said he does not need a hip replacement now, but he will in the future. He went on two trips to [place] and the required a lot of sitting. He struggled to sit in the  very low chairs and he would like to be able to do that. He walks 5x a week with a group for 45 mins.   PERTINENT HISTORY: Peripheral neuropathy ; OA; Depression; DDD; Hx low back pain PAIN:  Are you having pain? Yes: NPRS scale:   Pain location: front and side of his left quad Pain description: achy Aggravating factors: getting out of bed; walking;  Relieving factors: stretching   PRECAUTIONS: None  RED FLAGS: None   WEIGHT BEARING RESTRICTIONS: No  FALLS:  Has patient fallen in last 6 months? No  LIVING ENVIRONMENT: Lives with: lives alone Lives in: House/apartment Stairs: No Has following equipment at home: He has a cane, walker, shower chair, but he does not use anything  OCCUPATION: Retired   PLOF: Independent, Independent with basic ADLs, Independent with household mobility without device, Independent with  community mobility without device, Independent with gait, Independent with transfers, and Leisure: Working in the yard, garden , working on cars  PATIENT GOALS: To get rid of pain  NEXT MD VISIT: PRN  OBJECTIVE:  Note: Objective measures were completed at Evaluation unless otherwise noted.  DIAGNOSTIC FINDINGS: Patient reports Lt hip arthritis (Dr. Hiram)   PATIENT SURVEYS:  LEFS: 56/80 70%   COGNITION: Overall cognitive status: Within functional limits for tasks assessed     SENSATION: Patient had peripheral neuropathy   MUSCLE LENGTH: Hamstrings: decreased bilateral Lt > Rt    POSTURE: rounded shoulders, forward head, increased thoracic kyphosis, and weight shift right  PALPATION: Tenderness around Lt hip joint. Patient has a deep achy pain in left hip pain.  LOWER EXTREMITY ROM: Left hip PROM limited in all directions  Lt >Rt  ; patient unable to achieve FABER position on left. Patient has pain at end ranges.  LOWER EXTREMITY MMT: Grossly 4+/5 on right; 4 to 4+/5 on Lt some pain with left hip abduction testing;   FUNCTIONAL TESTS:  5 times sit to stand: 9.18 sec no UE support;  uncontrolled descend; anterior LOB requiring stepping strategy   Timed up and go (TUG): 5.00 sec   GAIT:  Comments: Right lateral trunk lean; antalgic gait                                                                                                                               TREATMENT DATE:  01/06/24 NuStep L5 x10' PT present to discuss status - Pt requested to go beyond 6' to get hip looser Seated trunk flexion rocking with hands on blue physioball x20 Standing hip flexor stretch foot on 2nd step 2x20 bil Standing quad stretch foot on chair 2x20 bil Step up 8 with contralateral march to 90/90 intermittent UE support x10 each 6 hip hike (lateral step up) x10 each Sit to stand from 16 height black box x 8 Leg press 80lb bil LE seat 9 x25 Supine hip IR/ER windshield wipers  3x10 Supine feet on blue physioball roll ball in/out with  wide knees and gently press outward for adductor stretch and gently hug knees for hip flexion stretch  12/31/2023 Initial Evaluation & HEP created     PATIENT EDUCATION:  Education details: PT eval findings, anticipated POC, progress with PT, and initial HEP Person educated: Patient Education method: Explanation, Demonstration, and Handouts Education comprehension: verbalized understanding, returned demonstration, and needs further education  HOME EXERCISE PROGRAM: Access Code: [email or web address] URL: [email or web address] Date: 12/31/2023 Prepared by: Kristeen Sar  Exercises - Supine Hip Internal and External Rotation  - 1 x daily - 7 x weekly - 1 sets - 10 reps - 5 hold - Seated Hamstring Stretch  - 1 x daily - 7 x weekly - 2 sets - 10 reps - 20-30s hold - Quadricep Stretch with Chair and Counter Support  - 1 x daily - 7 x weekly - 2 sets - 20-30 hold - Supine ITB Stretch with Strap  - 1 x daily - 7 x weekly - 2 sets - 20-30 hold - Supine Hamstring Stretch with Strap  - 1 x daily - 7 x weekly - 2 sets - 20-30s hold  ASSESSMENT:  CLINICAL IMPRESSION: Pt was anxious to get started with PT and eager to exercise.  Pt has limited Lt hip flexion but has a goal to be able to sit as low as possible to be able to sit on low chairs in [place] when he visits.  He was able to mostly control stand to sit to 16 box today but is limited by hip arthritis which we discussed will limit him beyond stretching and training to get much lower.  He tolerated all stretches and strength training today with report of good workout soreness end of session today.   Eval: Patient is a 78 y.o. male who was seen today for physical therapy evaluation and treatment for left hip pain. Mitsuo presents to skilled therapy with chronic left hip pain that recently flared up 3-4 months ago. He has pain instantly in the mornings when he gets up and it usually  goes away with movement, but recently he has not gotten any relief. He walks 5x a week for 45 mins with a walking group. He enjoys gardening and doing yard work but his left hip gives him some pain but he pushes through it. His sleep is greatly disturbed by his left hip pain. Based on evaluation noted muscle weakness, decreased ROM, poor posture, and decreased flexibility. Patient is motivated and wants to be educated on how to manage pain. Patient will benefit from skilled PT to address the below impairments and improve overall function.   OBJECTIVE IMPAIRMENTS: Abnormal gait, decreased balance, decreased coordination, decreased mobility, difficulty walking, decreased ROM, decreased strength, hypomobility, impaired perceived functional ability, increased muscle spasms, impaired flexibility, impaired sensation, improper body mechanics, postural dysfunction, and pain.   ACTIVITY LIMITATIONS: bending, sitting, standing, squatting, sleeping, stairs, dressing, hygiene/grooming, and locomotion level  PARTICIPATION LIMITATIONS: meal prep, cleaning, laundry, interpersonal relationship, community activity, and yard work  PERSONAL FACTORS: Age and 1-2 comorbidities: OA; peripheral neuropathy  are also affecting patient's functional outcome.   REHAB POTENTIAL: Good  CLINICAL DECISION MAKING: Evolving/moderate complexity  EVALUATION COMPLEXITY: Moderate   GOALS: Goals reviewed with patient? Yes  SHORT TERM GOALS: Target date: 01/28/2024  Patient will be independent with initial HEP. Baseline:  Goal status: INITIAL  2.  Patient will report > or = to 30% improvement in sleep quality since starting PT. Baseline:  Goal status: INITIAL  3.  Patient will be  able to perform 5STS test with no losses of balance. Baseline: anterior LOB Goal status: INITIAL  4.  Patient will be able to participate in  to establish baseline for functional test.  Baseline:  Goal status: INITIAL   LONG TERM  GOALS: Target date: 02/25/2024  Patient will demonstrate independence in advanced HEP. Baseline:  Goal status: INITIAL  2.  Patient will report > or = to 50% improvement in left hip pain since starting PT. Baseline:  Goal status: INITIAL  3.  Patient will be able to garden with < or = to 3/10 left hip pain. Baseline:  Goal status: INITIAL  4.  Patient will demonstrate at least 4+/5 strength on left for improved performance of sit to stand and community negotiation. Baseline:  Goal status: INITIAL  5.  Patient will score > or = to 66/80 on LEFS due to improved function and decreased disability. Baseline: 56/80  Goal status: INITIAL    PLAN:  PT FREQUENCY: 2x/week  PT DURATION: 8 weeks  PLANNED INTERVENTIONS: 97164- PT Re-evaluation, 97110-Therapeutic exercises, 97530- Therapeutic activity, 97112- Neuromuscular re-education, 97535- Self Care, 02859- Manual therapy, [phone]- Gait training, [phone]- Canalith repositioning, [id number]- Aquatic Therapy, [phone]- Electrical stimulation (unattended), [phone]- Electrical stimulation (manual), [id number]- Vasopneumatic device, [id number]- Ultrasound, [id number]- Traction (mechanical), [id number]- Ionotophoresis 4mg /ml Dexamethasone, 79439 (1-2 muscles), 20561 (3+ muscles)- Dry Needling, Patient/Family education, Balance training, Stair training, Taping, Joint mobilization, Joint manipulation, Spinal manipulation, Spinal mobilization, Vestibular training, Cryotherapy, and Moist heat  PLAN FOR NEXT SESSION: Review HEP; hip flexibility; postural strengthening; gait mechanics; ; NuStep   Orvil Fester, PT 01/06/24 3:44 PM  [facility] [address], [address] [place], [place] [address] Phone # [phone] Fax [phone]

## 2024-01-08 ENCOUNTER — Ambulatory Visit: Attending: Adult Health

## 2024-01-08 DIAGNOSIS — R252 Cramp and spasm: Secondary | ICD-10-CM | POA: Insufficient documentation

## 2024-01-08 DIAGNOSIS — M25552 Pain in left hip: Secondary | ICD-10-CM | POA: Insufficient documentation

## 2024-01-08 DIAGNOSIS — M6281 Muscle weakness (generalized): Secondary | ICD-10-CM | POA: Diagnosis not present

## 2024-01-08 DIAGNOSIS — R262 Difficulty in walking, not elsewhere classified: Secondary | ICD-10-CM | POA: Insufficient documentation

## 2024-01-08 DIAGNOSIS — R293 Abnormal posture: Secondary | ICD-10-CM | POA: Diagnosis not present

## 2024-01-08 NOTE — Therapy (Signed)
 OUTPATIENT PHYSICAL THERAPY LOWER EXTREMITY TREATMENT NOTE   [name] MRN: [id number] DOB:Dec 28, 1945, 78 y.o., male Today's Date: 01/08/2024  END OF SESSION:  PT End of Session - 01/08/24 1448     Visit Number 3    Date for Recertification  02/25/24    Authorization Type [employer] Approved 16 vls 12/31/23-02/26/24    Authorization - Visit Number 3    Authorization - Number of Visits 16    Progress Note Due on Visit 10    PT Start Time 1445    PT Stop Time 1532    PT Time Calculation (min) 47 min    Activity Tolerance Patient tolerated treatment well    Behavior During Therapy WFL for tasks assessed/performed           Past Medical History:  Diagnosis Date   Abnormality of gait    Arthritis    Atrial fibrillation (HCC)    BPH (benign prostatic hyperplasia)    Bradycardia    Cardiomyopathy (HCC)    Cervical disc disorder    Chicken pox    Closed fracture of one or more phalanges of foot    Clotting disorder    low platelets   Combined systolic and diastolic cardiac dysfunction    DDD (degenerative disc disease), cervical    Depression    Elevated PSA    Fracture of foot bone, right, closed 1964   GERD (gastroesophageal reflux disease)    Hammer toe    Hayfever    Hearing loss    Heart murmur    Hyperlipidemia    Neoplasm of uncertain behavior of skin    Pilonidal cyst with abscess    Polyneuropathy    Rheumatic fever    Seasonal allergies    Talipes cavus    Tinnitus    Past Surgical History:  Procedure Laterality Date   COLONOSCOPY     2006 normal exam   INGUINAL HERNIA REPAIR     PILONIDAL CYST EXCISION  1966   WISDOM TOOTH EXTRACTION     Patient Active Problem List   Diagnosis Date Noted   Malignant neoplasm of prostate (HCC) 04/19/2022   Bilateral carpal tunnel syndrome 08/18/2020   Midline low back pain without sciatica 09/30/2019   Atrophy of muscle of right lower leg 09/13/2019   Lumbar radiculopathy 09/13/2019   Peripheral  neuropathy 09/13/2019   Gait abnormality 08/19/2019   Paresthesia 08/19/2019   Neck pain 08/19/2019   Memory loss 08/19/2019   Chronic diastolic heart failure (HCC) 03/21/2016   Murmur, cardiac 01/15/2016   Dyspnea 01/15/2016   Abnormal ECG 01/15/2016   Hypertriglyceridemia 01/15/2016    PCP: Merna Huxley, NP  REFERRING PROVIDER: Merna Huxley, NP  REFERRING DIAG: [phone] (ICD-10-CM) - Left leg pain   THERAPY DIAG:  Pain in left hip  Muscle weakness (generalized)  Cramp and spasm  Difficulty in walking, not elsewhere classified  Abnormal posture  Rationale for Evaluation and Treatment: Rehabilitation  ONSET DATE: 3-4 months ago  SUBJECTIVE:   SUBJECTIVE STATEMENT: No new complaints.  Feeling a little stiff.  Eval: Patient presents with left hip pain that is usually worse in the mornings. He went to Dr. 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 He reports Dr. Hiram said he does not need a hip replacement now, but he will in the future. He went on two trips to [place] and the required a lot of sitting. He struggled to sit in the very  low chairs and he would like to be able to do that. He walks 5x a week with a group for 45 mins.   PERTINENT HISTORY: Peripheral neuropathy ; OA; Depression; DDD; Hx low back pain PAIN:  Are you having pain? Yes: NPRS scale:   Pain location: front and side of his left quad Pain description: achy Aggravating factors: getting out of bed; walking;  Relieving factors: stretching   PRECAUTIONS: None  RED FLAGS: None   WEIGHT BEARING RESTRICTIONS: No  FALLS:  Has patient fallen in last 6 months? No  LIVING ENVIRONMENT: Lives with: lives alone Lives in: House/apartment Stairs: No Has following equipment at home: He has a cane, walker, shower chair, but he does not use anything  OCCUPATION: Retired   PLOF: Independent, Independent with basic ADLs, Independent with household mobility without device,  Independent with community mobility without device, Independent with gait, Independent with transfers, and Leisure: Working in the yard, garden , working on cars  PATIENT GOALS: To get rid of pain  NEXT MD VISIT: PRN  OBJECTIVE:  Note: Objective measures were completed at Evaluation unless otherwise noted.  DIAGNOSTIC FINDINGS: Patient reports Lt hip arthritis (Dr. Hiram)   PATIENT SURVEYS:  LEFS: 56/80 70%   COGNITION: Overall cognitive status: Within functional limits for tasks assessed     SENSATION: Patient had peripheral neuropathy   MUSCLE LENGTH: Hamstrings: decreased bilateral Lt > Rt    POSTURE: rounded shoulders, forward head, increased thoracic kyphosis, and weight shift right  PALPATION: Tenderness around Lt hip joint. Patient has a deep achy pain in left hip pain.  LOWER EXTREMITY ROM: Left hip PROM limited in all directions  Lt >Rt  ; patient unable to achieve FABER position on left. Patient has pain at end ranges.  LOWER EXTREMITY MMT: Grossly 4+/5 on right; 4 to 4+/5 on Lt some pain with left hip abduction testing;   FUNCTIONAL TESTS:  5 times sit to stand: 9.18 sec no UE support;  uncontrolled descend; anterior LOB requiring stepping strategy   Timed up and go (TUG): 5.00 sec     : 1757.2 feet GAIT:  Comments: Right lateral trunk lean; antalgic gait                                                                                                                               TREATMENT DATE:  01/08/24 NuStep L5 x5' PT present to discuss status -  6 MWT: see results below Standing hamstring stretch 3 x 30 each LE Standing quad/hip flexor stretch 3 x 30  Seated piriformis stretch 3 x 30 Lateral band walks with yellow loop x 3 laps of 10 steps Supine hook lying clam x 20 with black and yellow loops  01/06/24 NuStep L5 x10' PT present to discuss status - Pt requested to go beyond 6' to get hip looser Seated trunk flexion rocking with hands on  blue physioball x20 Standing hip flexor stretch foot on  2nd step 2x20 bil Standing quad stretch foot on chair 2x20 bil Step up 8 with contralateral march to 90/90 intermittent UE support x10 each 6 hip hike (lateral step up) x10 each Sit to stand from 16 height black box x 8 Leg press 80lb bil LE seat 9 x25 Supine hip IR/ER windshield wipers 3x10 Supine feet on blue physioball roll ball in/out with wide knees and gently press outward for adductor stretch and gently hug knees for hip flexion stretch  12/31/2023 Initial Evaluation & HEP created     PATIENT EDUCATION:  Education details: PT eval findings, anticipated POC, progress with PT, and initial HEP Person educated: Patient Education method: Explanation, Demonstration, and Handouts Education comprehension: verbalized understanding, returned demonstration, and needs further education  HOME EXERCISE PROGRAM: Access Code: [email or web address] URL: [email or web address] Date: 12/31/2023 Prepared by: Kristeen Sar  Exercises - Supine Hip Internal and External Rotation  - 1 x daily - 7 x weekly - 1 sets - 10 reps - 5 hold - Seated Hamstring Stretch  - 1 x daily - 7 x weekly - 2 sets - 10 reps - 20-30s hold - Quadricep Stretch with Chair and Counter Support  - 1 x daily - 7 x weekly - 2 sets - 20-30 hold - Supine ITB Stretch with Strap  - 1 x daily - 7 x weekly - 2 sets - 20-30 hold - Supine Hamstring Stretch with Strap  - 1 x daily - 7 x weekly - 2 sets - 20-30s hold  ASSESSMENT:  CLINICAL IMPRESSION: Rob is somewhat impulsive and needs frequent vc's for correct technique.  His 6 MWT was considerably above average for his age range.  His left hip mobility continues to be very restricted.  He needs assist of towel to get into figure 4 position.  Overall, he did well today.  He should continue to improve but will need reminders to avoid self directing.  Patient would benefit from continuing skilled PT to progress toward goals  below.     Eval: Patient is a 78 y.o. male who was seen today for physical therapy evaluation and treatment for left hip pain. Juan presents to skilled therapy with chronic left hip pain that recently flared up 3-4 months ago. He has pain instantly in the mornings when he gets up and it usually goes away with movement, but recently he has not gotten any relief. He walks 5x a week for 45 mins with a walking group. He enjoys gardening and doing yard work but his left hip gives him some pain but he pushes through it. His sleep is greatly disturbed by his left hip pain. Based on evaluation noted muscle weakness, decreased ROM, poor posture, and decreased flexibility. Patient is motivated and wants to be educated on how to manage pain. Patient will benefit from skilled PT to address the below impairments and improve overall function.   OBJECTIVE IMPAIRMENTS: Abnormal gait, decreased balance, decreased coordination, decreased mobility, difficulty walking, decreased ROM, decreased strength, hypomobility, impaired perceived functional ability, increased muscle spasms, impaired flexibility, impaired sensation, improper body mechanics, postural dysfunction, and pain.   ACTIVITY LIMITATIONS: bending, sitting, standing, squatting, sleeping, stairs, dressing, hygiene/grooming, and locomotion level  PARTICIPATION LIMITATIONS: meal prep, cleaning, laundry, interpersonal relationship, community activity, and yard work  PERSONAL FACTORS: Age and 1-2 comorbidities: OA; peripheral neuropathy  are also affecting patient's functional outcome.   REHAB POTENTIAL: Good  CLINICAL DECISION MAKING: Evolving/moderate complexity  EVALUATION COMPLEXITY: Moderate   GOALS: Goals reviewed with patient? Yes  SHORT TERM GOALS: Target date: 01/28/2024  Patient will be independent with initial HEP. Baseline:  Goal status: INITIAL  2.  Patient will report > or = to 30% improvement in sleep quality since starting  PT. Baseline:  Goal status: INITIAL  3.  Patient will be able to perform 5STS test with no losses of balance. Baseline: anterior LOB Goal status: INITIAL  4.  Patient will be able to participate in  to establish baseline for functional test.  Baseline:  Goal status: INITIAL   LONG TERM GOALS: Target date: 02/25/2024  Patient will demonstrate independence in advanced HEP. Baseline:  Goal status: INITIAL  2.  Patient will report > or = to 50% improvement in left hip pain since starting PT. Baseline:  Goal status: INITIAL  3.  Patient will be able to garden with < or = to 3/10 left hip pain. Baseline:  Goal status: INITIAL  4.  Patient will demonstrate at least 4+/5 strength on left for improved performance of sit to stand and community negotiation. Baseline:  Goal status: INITIAL  5.  Patient will score > or = to 66/80 on LEFS due to improved function and decreased disability. Baseline: 56/80  Goal status: INITIAL    PLAN:  PT FREQUENCY: 2x/week  PT DURATION: 8 weeks  PLANNED INTERVENTIONS: 97164- PT Re-evaluation, 97110-Therapeutic exercises, 97530- Therapeutic activity, 97112- Neuromuscular re-education, 97535- Self Care, 02859- Manual therapy, [id number]- Gait training, [phone]- Canalith repositioning, [id number]- Aquatic Therapy, [phone]- Electrical stimulation (unattended), [id number]- Electrical stimulation (manual), [id number]- Vasopneumatic device, [id number]- Ultrasound, [id number]- Traction (mechanical), [id number]- Ionotophoresis 4mg /ml Dexamethasone, 79439 (1-2 muscles), 20561 (3+ muscles)- Dry Needling, Patient/Family education, Balance training, Stair training, Taping, Joint mobilization, Joint manipulation, Spinal manipulation, Spinal mobilization, Vestibular training, Cryotherapy, and Moist heat  PLAN FOR NEXT SESSION: Review HEP; hip flexibility; postural strengthening; gait mechanics; ; NuStep   Inger Wiest B. Charlotta Lapaglia, PT 01/08/24 5:39 PM [facility] [address], [address] [place], [place] [address] Phone # [phone] Fax [phone]

## 2024-01-13 ENCOUNTER — Ambulatory Visit

## 2024-01-13 DIAGNOSIS — R252 Cramp and spasm: Secondary | ICD-10-CM

## 2024-01-13 DIAGNOSIS — R262 Difficulty in walking, not elsewhere classified: Secondary | ICD-10-CM | POA: Diagnosis not present

## 2024-01-13 DIAGNOSIS — R293 Abnormal posture: Secondary | ICD-10-CM

## 2024-01-13 DIAGNOSIS — M6281 Muscle weakness (generalized): Secondary | ICD-10-CM | POA: Diagnosis not present

## 2024-01-13 DIAGNOSIS — M25552 Pain in left hip: Secondary | ICD-10-CM

## 2024-01-13 NOTE — Therapy (Signed)
 OUTPATIENT PHYSICAL THERAPY LOWER EXTREMITY TREATMENT NOTE   [name] MRN: [id number] DOB:1945/04/25, 78 y.o., male Today's Date: 01/13/2024  END OF SESSION:  PT End of Session - 01/13/24 1452     Visit Number 4    Date for Recertification  02/25/24    Authorization Type [employer] Approved 16 vls 12/31/23-02/26/24    Authorization - Number of Visits 16    Progress Note Due on Visit 10    PT Start Time 1449    PT Stop Time 1530    PT Time Calculation (min) 41 min    Activity Tolerance Patient tolerated treatment well    Behavior During Therapy WFL for tasks assessed/performed           Past Medical History:  Diagnosis Date   Abnormality of gait    Arthritis    Atrial fibrillation (HCC)    BPH (benign prostatic hyperplasia)    Bradycardia    Cardiomyopathy (HCC)    Cervical disc disorder    Chicken pox    Closed fracture of one or more phalanges of foot    Clotting disorder    low platelets   Combined systolic and diastolic cardiac dysfunction    DDD (degenerative disc disease), cervical    Depression    Elevated PSA    Fracture of foot bone, right, closed 1964   GERD (gastroesophageal reflux disease)    Hammer toe    Hayfever    Hearing loss    Heart murmur    Hyperlipidemia    Neoplasm of uncertain behavior of skin    Pilonidal cyst with abscess    Polyneuropathy    Rheumatic fever    Seasonal allergies    Talipes cavus    Tinnitus    Past Surgical History:  Procedure Laterality Date   COLONOSCOPY     2006 normal exam   INGUINAL HERNIA REPAIR     PILONIDAL CYST EXCISION  1966   WISDOM TOOTH EXTRACTION     Patient Active Problem List   Diagnosis Date Noted   Malignant neoplasm of prostate (HCC) 04/19/2022   Bilateral carpal tunnel syndrome 08/18/2020   Midline low back pain without sciatica 09/30/2019   Atrophy of muscle of right lower leg 09/13/2019   Lumbar radiculopathy 09/13/2019   Peripheral neuropathy 09/13/2019   Gait  abnormality 08/19/2019   Paresthesia 08/19/2019   Neck pain 08/19/2019   Memory loss 08/19/2019   Chronic diastolic heart failure (HCC) 03/21/2016   Murmur, cardiac 01/15/2016   Dyspnea 01/15/2016   Abnormal ECG 01/15/2016   Hypertriglyceridemia 01/15/2016    PCP: Merna Huxley, NP  REFERRING PROVIDER: Merna Huxley, NP  REFERRING DIAG: [phone] (ICD-10-CM) - Left leg pain   THERAPY DIAG:  Pain in left hip  Muscle weakness (generalized)  Cramp and spasm  Difficulty in walking, not elsewhere classified  Abnormal posture  Rationale for Evaluation and Treatment: Rehabilitation  ONSET DATE: 3-4 months ago  SUBJECTIVE:   SUBJECTIVE STATEMENT: Patient reports he is feeling a little stiff.  He was sore after last visit but just backed down on his HEP for a few days.  Eval: Patient presents with left hip pain that is usually worse in the mornings. He went to Dr. 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 He reports Dr. Hiram said he does not need a hip replacement now, but he will in the future. He went on two trips to [place] and the required a  lot of sitting. He struggled to sit in the very low chairs and he would like to be able to do that. He walks 5x a week with a group for 45 mins.   PERTINENT HISTORY: Peripheral neuropathy ; OA; Depression; DDD; Hx low back pain PAIN:  Are you having pain? Yes: NPRS scale:   Pain location: front and side of his left quad Pain description: achy Aggravating factors: getting out of bed; walking;  Relieving factors: stretching   PRECAUTIONS: None  RED FLAGS: None   WEIGHT BEARING RESTRICTIONS: No  FALLS:  Has patient fallen in last 6 months? No  LIVING ENVIRONMENT: Lives with: lives alone Lives in: House/apartment Stairs: No Has following equipment at home: He has a cane, walker, shower chair, but he does not use anything  OCCUPATION: Retired   PLOF: Independent, Independent with basic ADLs,  Independent with household mobility without device, Independent with community mobility without device, Independent with gait, Independent with transfers, and Leisure: Working in the yard, garden , working on cars  PATIENT GOALS: To get rid of pain  NEXT MD VISIT: PRN  OBJECTIVE:  Note: Objective measures were completed at Evaluation unless otherwise noted.  DIAGNOSTIC FINDINGS: Patient reports Lt hip arthritis (Dr. Hiram)   PATIENT SURVEYS:  LEFS: 56/80 70%   COGNITION: Overall cognitive status: Within functional limits for tasks assessed     SENSATION: Patient had peripheral neuropathy   MUSCLE LENGTH: Hamstrings: decreased bilateral Lt > Rt    POSTURE: rounded shoulders, forward head, increased thoracic kyphosis, and weight shift right  PALPATION: Tenderness around Lt hip joint. Patient has a deep achy pain in left hip pain.  LOWER EXTREMITY ROM: Left hip PROM limited in all directions  Lt >Rt  ; patient unable to achieve FABER position on left. Patient has pain at end ranges.  LOWER EXTREMITY MMT: Grossly 4+/5 on right; 4 to 4+/5 on Lt some pain with left hip abduction testing;   FUNCTIONAL TESTS:  5 times sit to stand: 9.18 sec no UE support;  uncontrolled descend; anterior LOB requiring stepping strategy   Timed up and go (TUG): 5.00 sec     : 1757.2 feet GAIT:  Comments: Right lateral trunk lean; antalgic gait                                                                                                                               TREATMENT DATE:  01/13/24 NuStep L5-8 x6' PT present to discuss status  Standing hamstring stretch 3 x 30 each LE Standing quad/hip flexor stretch 3 x 30  Seated piriformis stretch 3 x 30 Lunges x 10 each LE fwd then x 10 each LE lateral Lateral band walks with yellow loop x 3 laps of 10 steps Supine/hook lying hamstring pulls (1st set both feet push out, flex one at a time but patient began cramping, 2nd set individual  but alternating ext/flexion in bridge position x 10 each LE  Supine adductor stretch with strap x 10 holding 10 seconds Bent knee fall outs x 10 in between supine exercises   01/08/24 NuStep L5 x5' PT present to discuss status -  6 MWT: see results below Standing hamstring stretch 3 x 30 each LE Standing quad/hip flexor stretch 3 x 30  Seated piriformis stretch 3 x 30 Lateral band walks with yellow loop x 3 laps of 10 steps Supine hook lying clam x 20 with black and yellow loops  01/06/24 NuStep L5 x10' PT present to discuss status - Pt requested to go beyond 6' to get hip looser Seated trunk flexion rocking with hands on blue physioball x20 Standing hip flexor stretch foot on 2nd step 2x20 bil Standing quad stretch foot on chair 2x20 bil Step up 8 with contralateral march to 90/90 intermittent UE support x10 each 6 hip hike (lateral step up) x10 each Sit to stand from 16 height black box x 8 Leg press 80lb bil LE seat 9 x25 Supine hip IR/ER windshield wipers 3x10 Supine feet on blue physioball roll ball in/out with wide knees and gently press outward for adductor stretch and gently hug knees for hip flexion stretch  12/31/2023 Initial Evaluation & HEP created     PATIENT EDUCATION:  Education details: PT eval findings, anticipated POC, progress with PT, and initial HEP Person educated: Patient Education method: Explanation, Demonstration, and Handouts Education comprehension: verbalized understanding, returned demonstration, and needs further education  HOME EXERCISE PROGRAM: Access Code: [email or web address] URL: [email or web address] Date: 12/31/2023 Prepared by: Kristeen Sar  Exercises - Supine Hip Internal and External Rotation  - 1 x daily - 7 x weekly - 1 sets - 10 reps - 5 hold - Seated Hamstring Stretch  - 1 x daily - 7 x weekly - 2 sets - 10 reps - 20-30s hold - Quadricep Stretch with Chair and Counter Support  - 1 x daily - 7 x weekly - 2 sets - 20-30  hold - Supine ITB Stretch with Strap  - 1 x daily - 7 x weekly - 2 sets - 20-30 hold - Supine Hamstring Stretch with Strap  - 1 x daily - 7 x weekly - 2 sets - 20-30s hold  ASSESSMENT:  CLINICAL IMPRESSION: Chelsea is progressing appropriately.  His hip is fairly end stage OA and he is likely in need of THA.  He states that Dr. Melodi says he does not need it yet but this was at at visit on 08/29/22.  His changes have likely progressed.  He is extremely well motivated and wants to avoid surgery if possible.   He should continue to improve but will likely plateau with PT due to the severity of the OA in the right knee and left hip.  Patient would benefit from continuing skilled PT to progress toward goals below.    Eval: Patient is a 78 y.o. male who was seen today for physical therapy evaluation and treatment for left hip pain. Jaevon presents to skilled therapy with chronic left hip pain that recently flared up 3-4 months ago. He has pain instantly in the mornings when he gets up and it usually goes away with movement, but recently he has not gotten any relief. He walks 5x a week for 45 mins with a walking group. He enjoys gardening and doing yard work but his left hip gives him some pain but he pushes through it. His sleep is greatly disturbed by his left hip pain. Based on evaluation noted muscle weakness, decreased  ROM, poor posture, and decreased flexibility. Patient is motivated and wants to be educated on how to manage pain. Patient will benefit from skilled PT to address the below impairments and improve overall function.   OBJECTIVE IMPAIRMENTS: Abnormal gait, decreased balance, decreased coordination, decreased mobility, difficulty walking, decreased ROM, decreased strength, hypomobility, impaired perceived functional ability, increased muscle spasms, impaired flexibility, impaired sensation, improper body mechanics, postural dysfunction, and pain.   ACTIVITY LIMITATIONS: bending, sitting,  standing, squatting, sleeping, stairs, dressing, hygiene/grooming, and locomotion level  PARTICIPATION LIMITATIONS: meal prep, cleaning, laundry, interpersonal relationship, community activity, and yard work  PERSONAL FACTORS: Age and 1-2 comorbidities: OA; peripheral neuropathy  are also affecting patient's functional outcome.   REHAB POTENTIAL: Good  CLINICAL DECISION MAKING: Evolving/moderate complexity  EVALUATION COMPLEXITY: Moderate   GOALS: Goals reviewed with patient? Yes  SHORT TERM GOALS: Target date: 01/28/2024  Patient will be independent with initial HEP. Baseline:  Goal status: MET 01/13/24  2.  Patient will report > or = to 30% improvement in sleep quality since starting PT. Baseline:  Goal status: INITIAL  3.  Patient will be able to perform 5STS test with no losses of balance. Baseline: anterior LOB Goal status: INITIAL  4.  Patient will be able to participate in  to establish baseline for functional test.  Baseline:  Goal status: MET 01/13/24   LONG TERM GOALS: Target date: 02/25/2024  Patient will demonstrate independence in advanced HEP. Baseline:  Goal status: INITIAL  2.  Patient will report > or = to 50% improvement in left hip pain since starting PT. Baseline:  Goal status: INITIAL  3.  Patient will be able to garden with < or = to 3/10 left hip pain. Baseline:  Goal status: INITIAL  4.  Patient will demonstrate at least 4+/5 strength on left for improved performance of sit to stand and community negotiation. Baseline:  Goal status: INITIAL  5.  Patient will score > or = to 66/80 on LEFS due to improved function and decreased disability. Baseline: 56/80  Goal status: INITIAL    PLAN:  PT FREQUENCY: 2x/week  PT DURATION: 8 weeks  PLANNED INTERVENTIONS: 97164- PT Re-evaluation, 97110-Therapeutic exercises, 97530- Therapeutic activity, 97112- Neuromuscular re-education, 97535- Self Care, 02859- Manual therapy, [id number]- Gait  training, [phone]- Canalith repositioning, [id number]- Aquatic Therapy, [phone]- Electrical stimulation (unattended), [phone]- Electrical stimulation (manual), [id number]- Vasopneumatic device, [id number]- Ultrasound, [id number]- Traction (mechanical), [id number]- Ionotophoresis 4mg /ml Dexamethasone, 79439 (1-2 muscles), 20561 (3+ muscles)- Dry Needling, Patient/Family education, Balance training, Stair training, Taping, Joint mobilization, Joint manipulation, Spinal manipulation, Spinal mobilization, Vestibular training, Cryotherapy, and Moist heat  PLAN FOR NEXT SESSION:  NuStep, hip flexibility; postural strengthening; gait mechanics; ;   Briell Paulette B. Elizebeth Kluesner, PT 01/13/24 5:18 PM [facility] [address] [place], [place] [address] Phone # [phone] Fax [phone]

## 2024-01-15 ENCOUNTER — Ambulatory Visit: Admitting: Physical Therapy

## 2024-01-15 ENCOUNTER — Encounter: Payer: Self-pay | Admitting: Physical Therapy

## 2024-01-15 DIAGNOSIS — R252 Cramp and spasm: Secondary | ICD-10-CM

## 2024-01-15 DIAGNOSIS — R293 Abnormal posture: Secondary | ICD-10-CM

## 2024-01-15 DIAGNOSIS — M6281 Muscle weakness (generalized): Secondary | ICD-10-CM

## 2024-01-15 DIAGNOSIS — M25552 Pain in left hip: Secondary | ICD-10-CM

## 2024-01-15 DIAGNOSIS — R262 Difficulty in walking, not elsewhere classified: Secondary | ICD-10-CM

## 2024-01-15 NOTE — Therapy (Signed)
 OUTPATIENT PHYSICAL THERAPY LOWER EXTREMITY TREATMENT NOTE   [name] MRN: [id number] DOB:Sep 18, 1945, 78 y.o., male Today's Date: 01/15/2024  END OF SESSION:  PT End of Session - 01/15/24 1454     Visit Number 5    Date for Recertification  02/25/24    Authorization Type [employer] Approved 16 vls 12/31/23-02/26/24    Authorization - Visit Number 4    Authorization - Number of Visits 16    Progress Note Due on Visit 10    PT Start Time 1450    PT Stop Time 1532    PT Time Calculation (min) 42 min    Activity Tolerance Patient tolerated treatment well    Behavior During Therapy WFL for tasks assessed/performed            Past Medical History:  Diagnosis Date   Abnormality of gait    Arthritis    Atrial fibrillation (HCC)    BPH (benign prostatic hyperplasia)    Bradycardia    Cardiomyopathy (HCC)    Cervical disc disorder    Chicken pox    Closed fracture of one or more phalanges of foot    Clotting disorder    low platelets   Combined systolic and diastolic cardiac dysfunction    DDD (degenerative disc disease), cervical    Depression    Elevated PSA    Fracture of foot bone, right, closed 1964   GERD (gastroesophageal reflux disease)    Hammer toe    Hayfever    Hearing loss    Heart murmur    Hyperlipidemia    Neoplasm of uncertain behavior of skin    Pilonidal cyst with abscess    Polyneuropathy    Rheumatic fever    Seasonal allergies    Talipes cavus    Tinnitus    Past Surgical History:  Procedure Laterality Date   COLONOSCOPY     2006 normal exam   INGUINAL HERNIA REPAIR     PILONIDAL CYST EXCISION  1966   WISDOM TOOTH EXTRACTION     Patient Active Problem List   Diagnosis Date Noted   Malignant neoplasm of prostate (HCC) 04/19/2022   Bilateral carpal tunnel syndrome 08/18/2020   Midline low back pain without sciatica 09/30/2019   Atrophy of muscle of right lower leg 09/13/2019   Lumbar radiculopathy 09/13/2019    Peripheral neuropathy 09/13/2019   Gait abnormality 08/19/2019   Paresthesia 08/19/2019   Neck pain 08/19/2019   Memory loss 08/19/2019   Chronic diastolic heart failure (HCC) 03/21/2016   Murmur, cardiac 01/15/2016   Dyspnea 01/15/2016   Abnormal ECG 01/15/2016   Hypertriglyceridemia 01/15/2016    PCP: Merna Huxley, NP  REFERRING PROVIDER: Merna Huxley, NP  REFERRING DIAG: [phone] (ICD-10-CM) - Left leg pain   THERAPY DIAG:  Pain in left hip  Muscle weakness (generalized)  Cramp and spasm  Difficulty in walking, not elsewhere classified  Abnormal posture  Rationale for Evaluation and Treatment: Rehabilitation  ONSET DATE: 3-4 months ago  SUBJECTIVE:   SUBJECTIVE STATEMENT: I am just stiff. Low pain level, maybe a 1-2/10.  Eval: Patient presents with left hip pain that is usually worse in the mornings. He went to Dr. 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 He reports Dr. Hiram said he does not need a hip replacement now, but he will in the future. He went on two trips to [place] and the required a lot of sitting. He struggled to sit  in the very low chairs and he would like to be able to do that. He walks 5x a week with a group for 45 mins.   PERTINENT HISTORY: Peripheral neuropathy ; OA; Depression; DDD; Hx low back pain PAIN:  Are you having pain? Yes: NPRS scale: 1-2 Pain location: front and side of his left quad Pain description: achy Aggravating factors: getting out of bed; walking;  Relieving factors: stretching   PRECAUTIONS: None  RED FLAGS: None   WEIGHT BEARING RESTRICTIONS: No  FALLS:  Has patient fallen in last 6 months? No  LIVING ENVIRONMENT: Lives with: lives alone Lives in: House/apartment Stairs: No Has following equipment at home: He has a cane, walker, shower chair, but he does not use anything  OCCUPATION: Retired   PLOF: Independent, Independent with basic ADLs, Independent with household mobility  without device, Independent with community mobility without device, Independent with gait, Independent with transfers, and Leisure: Working in the yard, garden , working on cars  PATIENT GOALS: To get rid of pain  NEXT MD VISIT: PRN  OBJECTIVE:  Note: Objective measures were completed at Evaluation unless otherwise noted.  DIAGNOSTIC FINDINGS: Patient reports Lt hip arthritis (Dr. Hiram)   PATIENT SURVEYS:  LEFS: 56/80 70%   COGNITION: Overall cognitive status: Within functional limits for tasks assessed     SENSATION: Patient had peripheral neuropathy   MUSCLE LENGTH: Hamstrings: decreased bilateral Lt > Rt    POSTURE: rounded shoulders, forward head, increased thoracic kyphosis, and weight shift right  PALPATION: Tenderness around Lt hip joint. Patient has a deep achy pain in left hip pain.  LOWER EXTREMITY ROM: Left hip PROM limited in all directions  Lt >Rt  ; patient unable to achieve FABER position on left. Patient has pain at end ranges.  LOWER EXTREMITY MMT: Grossly 4+/5 on right; 4 to 4+/5 on Lt some pain with left hip abduction testing;   FUNCTIONAL TESTS:  5 times sit to stand: 9.18 sec no UE support;  uncontrolled descend; anterior LOB requiring stepping strategy   Timed up and go (TUG): 5.00 sec     : 1757.2 feet GAIT:  Comments: Right lateral trunk lean; antalgic gait                                                                                                                               TREATMENT DATE:  Standing hamstring stretch 3 x 30 each LE Standing quad/hip flexor stretch 3 x 30  Elliptical x5' crossramp L3, resistance L8  Supine lower trunk rotation 3x10 bil Supine adductor stretch with strap x 10 holding 10 seconds Supine hasmtring stretch with strap x30 each  Supine bridge 5x10 Sidestepping red loop band 3 laps x 10 steps each way Pallof press double green tband 3x10 each way Chair sit ups 7lb dumbbell - Pt didn't connect with  this Lunges x 10 each LE fwd then x 10 each LE lateral Standing bil row x25  blue tband for thoracic postural cueing  01/13/24 NuStep L5-8 x6' PT present to discuss status  Standing hamstring stretch 3 x 30 each LE Standing quad/hip flexor stretch 3 x 30  Seated piriformis stretch 3 x 30 Lunges x 10 each LE fwd then x 10 each LE lateral Lateral band walks with yellow loop x 3 laps of 10 steps Supine/hook lying hamstring pulls (1st set both feet push out, flex one at a time but patient began cramping, 2nd set individual but alternating ext/flexion in bridge position x 10 each LE Supine adductor stretch with strap x 10 holding 10 seconds Bent knee fall outs x 10 in between supine exercises   01/08/24 NuStep L5 x5' PT present to discuss status -  6 MWT: see results below Standing hamstring stretch 3 x 30 each LE Standing quad/hip flexor stretch 3 x 30  Seated piriformis stretch 3 x 30 Lateral band walks with yellow loop x 3 laps of 10 steps Supine hook lying clam x 20 with black and yellow loops  01/06/24 NuStep L5 x10' PT present to discuss status - Pt requested to go beyond 6' to get hip looser Seated trunk flexion rocking with hands on blue physioball x20 Standing hip flexor stretch foot on 2nd step 2x20 bil Standing quad stretch foot on chair 2x20 bil Step up 8 with contralateral march to 90/90 intermittent UE support x10 each 6 hip hike (lateral step up) x10 each Sit to stand from 16 height black box x 8 Leg press 80lb bil LE seat 9 x25 Supine hip IR/ER windshield wipers 3x10 Supine feet on blue physioball roll ball in/out with wide knees and gently press outward for adductor stretch and gently hug knees for hip flexion stretch  12/31/2023 Initial Evaluation & HEP created     PATIENT EDUCATION:  Education details: PT eval findings, anticipated POC, progress with PT, and initial HEP Person educated: Patient Education method: Explanation, Demonstration, and  Handouts Education comprehension: verbalized understanding, returned demonstration, and needs further education  HOME EXERCISE PROGRAM: Access Code: [email or web address] URL: [email or web address] Date: 01/15/2024 Prepared by: Orvil Lynton Crescenzo  Exercises - Supine Hip Internal and External Rotation  - 1 x daily - 7 x weekly - 1 sets - 10 reps - 5 hold - Seated Hamstring Stretch  - 1 x daily - 7 x weekly - 2 sets - 10 reps - 20-30s hold - Quadricep Stretch with Chair and Counter Support  - 1 x daily - 7 x weekly - 2 sets - 20-30 hold - Supine ITB Stretch with Strap  - 1 x daily - 7 x weekly - 2 sets - 20-30 hold - Supine Hamstring Stretch with Strap  - 1 x daily - 7 x weekly - 2 sets - 20-30s hold - Supine Bridge  - 1 x daily - 7 x weekly - 2 sets - 5 reps - 10 hold - Standard Lunge  - 1 x daily - 7 x weekly - 1 sets - 10 reps - Lateral Lunge  - 1 x daily - 7 x weekly - 1 sets - 10 reps - Side Stepping with Resistance at Feet  - 1 x daily - 7 x weekly - 3 sets - 10 reps  ASSESSMENT:  CLINICAL IMPRESSION: Marc Thornton is progressing appropriately.  His hip is fairly end stage OA and he is likely in need of THA.  He states that Dr. Melodi says he does not need it yet but this was at at visit on 08/29/22.  His changes have likely progressed.  He is extremely well motivated and wants to avoid surgery if possible.   He should continue to improve but will likely plateau with PT due to the severity of the OA in the right knee and left hip.  Patient would benefit from continuing skilled PT to progress toward goals below.    Eval: Patient is a 78 y.o. male who was seen today for physical therapy evaluation and treatment for left hip pain. Adonte presents to skilled therapy with chronic left hip pain that recently flared up 3-4 months ago. He has pain instantly in the mornings when he gets up and it usually goes away with movement, but recently he has not gotten any relief. He walks 5x a week for 45 mins with  a walking group. He enjoys gardening and doing yard work but his left hip gives him some pain but he pushes through it. His sleep is greatly disturbed by his left hip pain. Based on evaluation noted muscle weakness, decreased ROM, poor posture, and decreased flexibility. Patient is motivated and wants to be educated on how to manage pain. Patient will benefit from skilled PT to address the below impairments and improve overall function.   OBJECTIVE IMPAIRMENTS: Abnormal gait, decreased balance, decreased coordination, decreased mobility, difficulty walking, decreased ROM, decreased strength, hypomobility, impaired perceived functional ability, increased muscle spasms, impaired flexibility, impaired sensation, improper body mechanics, postural dysfunction, and pain.   ACTIVITY LIMITATIONS: bending, sitting, standing, squatting, sleeping, stairs, dressing, hygiene/grooming, and locomotion level  PARTICIPATION LIMITATIONS: meal prep, cleaning, laundry, interpersonal relationship, community activity, and yard work  PERSONAL FACTORS: Age and 1-2 comorbidities: OA; peripheral neuropathy  are also affecting patient's functional outcome.   REHAB POTENTIAL: Good  CLINICAL DECISION MAKING: Evolving/moderate complexity  EVALUATION COMPLEXITY: Moderate   GOALS: Goals reviewed with patient? Yes  SHORT TERM GOALS: Target date: 01/28/2024  Patient will be independent with initial HEP. Baseline:  Goal status: MET 01/13/24  2.  Patient will report > or = to 30% improvement in sleep quality since starting PT. Baseline:  Goal status: INITIAL  3.  Patient will be able to perform 5STS test with no losses of balance. Baseline: anterior LOB Goal status: INITIAL  4.  Patient will be able to participate in  to establish baseline for functional test.  Baseline:  Goal status: MET 01/13/24   LONG TERM GOALS: Target date: 02/25/2024  Patient will demonstrate independence in advanced HEP. Baseline:   Goal status: INITIAL  2.  Patient will report > or = to 50% improvement in left hip pain since starting PT. Baseline:  Goal status: INITIAL  3.  Patient will be able to garden with < or = to 3/10 left hip pain. Baseline:  Goal status: INITIAL  4.  Patient will demonstrate at least 4+/5 strength on left for improved performance of sit to stand and community negotiation. Baseline:  Goal status: INITIAL  5.  Patient will score > or = to 66/80 on LEFS due to improved function and decreased disability. Baseline: 56/80  Goal status: INITIAL    PLAN:  PT FREQUENCY: 2x/week  PT DURATION: 8 weeks  PLANNED INTERVENTIONS: [id number]- PT Re-evaluation, 97110-Therapeutic exercises, 97530- Therapeutic activity, [id number]- Neuromuscular re-education, 97535- Self Care, [id number]- Manual therapy, [id number]- Gait training, [phone]- Canalith repositioning, [id number]- Aquatic Therapy, [id number]- Electrical stimulation (unattended), [id number]- Electrical stimulation (manual), [id number]- Vasopneumatic device, [id number]- Ultrasound, [id number]- Traction (mechanical), [id number]- Ionotophoresis 4mg /ml Dexamethasone, 79439 (1-2 muscles), [id number] (3+ muscles)- Dry Needling, Patient/Family education,  Balance training, Stair training, Taping, Joint mobilization, Joint manipulation, Spinal manipulation, Spinal mobilization, Vestibular training, Cryotherapy, and Moist heat  PLAN FOR NEXT SESSION:  NuStep, hip flexibility; postural strengthening; gait mechanics; ;   Orvil Fester, PT 01/15/24 4:31 PM  [facility] [address], [address] [place], [place] [address] Phone # [phone] Fax [phone]

## 2024-01-20 ENCOUNTER — Ambulatory Visit

## 2024-01-20 DIAGNOSIS — R293 Abnormal posture: Secondary | ICD-10-CM

## 2024-01-20 DIAGNOSIS — M6281 Muscle weakness (generalized): Secondary | ICD-10-CM | POA: Diagnosis not present

## 2024-01-20 DIAGNOSIS — R252 Cramp and spasm: Secondary | ICD-10-CM | POA: Diagnosis not present

## 2024-01-20 DIAGNOSIS — M25552 Pain in left hip: Secondary | ICD-10-CM | POA: Diagnosis not present

## 2024-01-20 DIAGNOSIS — R262 Difficulty in walking, not elsewhere classified: Secondary | ICD-10-CM | POA: Diagnosis not present

## 2024-01-20 NOTE — Therapy (Signed)
 OUTPATIENT PHYSICAL THERAPY LOWER EXTREMITY TREATMENT NOTE   [name] MRN: [id number] DOB:08/09/1945, 78 y.o., male Today's Date: 01/20/2024  END OF SESSION:  PT End of Session - 01/20/24 1407     Visit Number 6    Date for Recertification  02/25/24    Authorization Type [employer] Approved 16 vls 12/31/23-02/26/24    Authorization - Visit Number 6    Authorization - Number of Visits 16    Progress Note Due on Visit 10    PT Start Time 1403    PT Stop Time 1445    PT Time Calculation (min) 42 min    Activity Tolerance Patient tolerated treatment well    Behavior During Therapy WFL for tasks assessed/performed            Past Medical History:  Diagnosis Date   Abnormality of gait    Arthritis    Atrial fibrillation (HCC)    BPH (benign prostatic hyperplasia)    Bradycardia    Cardiomyopathy (HCC)    Cervical disc disorder    Chicken pox    Closed fracture of one or more phalanges of foot    Clotting disorder    low platelets   Combined systolic and diastolic cardiac dysfunction    DDD (degenerative disc disease), cervical    Depression    Elevated PSA    Fracture of foot bone, right, closed 1964   GERD (gastroesophageal reflux disease)    Hammer toe    Hayfever    Hearing loss    Heart murmur    Hyperlipidemia    Neoplasm of uncertain behavior of skin    Pilonidal cyst with abscess    Polyneuropathy    Rheumatic fever    Seasonal allergies    Talipes cavus    Tinnitus    Past Surgical History:  Procedure Laterality Date   COLONOSCOPY     2006 normal exam   INGUINAL HERNIA REPAIR     PILONIDAL CYST EXCISION  1966   WISDOM TOOTH EXTRACTION     Patient Active Problem List   Diagnosis Date Noted   Malignant neoplasm of prostate (HCC) 04/19/2022   Bilateral carpal tunnel syndrome 08/18/2020   Midline low back pain without sciatica 09/30/2019   Atrophy of muscle of right lower leg 09/13/2019   Lumbar radiculopathy 09/13/2019    Peripheral neuropathy 09/13/2019   Gait abnormality 08/19/2019   Paresthesia 08/19/2019   Neck pain 08/19/2019   Memory loss 08/19/2019   Chronic diastolic heart failure (HCC) 03/21/2016   Murmur, cardiac 01/15/2016   Dyspnea 01/15/2016   Abnormal ECG 01/15/2016   Hypertriglyceridemia 01/15/2016    PCP: Merna Huxley, NP  REFERRING PROVIDER: Merna Huxley, NP  REFERRING DIAG: [phone] (ICD-10-CM) - Left leg pain   THERAPY DIAG:  Pain in left hip  Muscle weakness (generalized)  Cramp and spasm  Difficulty in walking, not elsewhere classified  Abnormal posture  Rationale for Evaluation and Treatment: Rehabilitation  ONSET DATE: 3-4 months ago  SUBJECTIVE:   SUBJECTIVE STATEMENT: Patient reports he may have overdone it last visit.  Reports having some left lateral hip pain in the area of the IT band and bursa.    Eval: Patient presents with left hip pain that is usually worse in the mornings. He went to Dr. 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 He reports Dr. Hiram said he does not need a hip replacement now, but he will in the future.  He went on two trips to [place] and the required a lot of sitting. He struggled to sit in the very low chairs and he would like to be able to do that. He walks 5x a week with a group for 45 mins.   PERTINENT HISTORY: Peripheral neuropathy ; OA; Depression; DDD; Hx low back pain PAIN:  01/20/24 Are you having pain? Yes: NPRS scale: 2-3 Pain location: front and side of his left quad Pain description: achy Aggravating factors: getting out of bed; walking;  Relieving factors: stretching   PRECAUTIONS: None  RED FLAGS: None   WEIGHT BEARING RESTRICTIONS: No  FALLS:  Has patient fallen in last 6 months? No  LIVING ENVIRONMENT: Lives with: lives alone Lives in: House/apartment Stairs: No Has following equipment at home: He has a cane, walker, shower chair, but he does not use anything  OCCUPATION:  Retired   PLOF: Independent, Independent with basic ADLs, Independent with household mobility without device, Independent with community mobility without device, Independent with gait, Independent with transfers, and Leisure: Working in the yard, garden , working on cars  PATIENT GOALS: To get rid of pain  NEXT MD VISIT: PRN  OBJECTIVE:  Note: Objective measures were completed at Evaluation unless otherwise noted.  DIAGNOSTIC FINDINGS: Patient reports Lt hip arthritis (Dr. Hiram)   PATIENT SURVEYS:  LEFS: 56/80 70%   COGNITION: Overall cognitive status: Within functional limits for tasks assessed     SENSATION: Patient had peripheral neuropathy   MUSCLE LENGTH: Hamstrings: decreased bilateral Lt > Rt    POSTURE: rounded shoulders, forward head, increased thoracic kyphosis, and weight shift right  PALPATION: Tenderness around Lt hip joint. Patient has a deep achy pain in left hip pain.  LOWER EXTREMITY ROM: Left hip PROM limited in all directions  Lt >Rt  ; patient unable to achieve FABER position on left. Patient has pain at end ranges.  LOWER EXTREMITY MMT: Grossly 4+/5 on right; 4 to 4+/5 on Lt some pain with left hip abduction testing;   FUNCTIONAL TESTS:  5 times sit to stand: 9.18 sec no UE support;  uncontrolled descend; anterior LOB requiring stepping strategy   Timed up and go (TUG): 5.00 sec     : 1757.2 feet GAIT:  Comments: Right lateral trunk lean; antalgic gait                                                                                                                               TREATMENT DATE:  01/20/24 Nustep x 6 min level 6 increased to level 7 at approx 5 min Standing hamstring stretch 3 x 30 each LE Standing quad/hip flexor stretch 3 x 30  Supine IT band stretch 3 x 30 seconds Left hip IT band rolling x 3-4 min Left hip IT band and glut STM with theragun x 3-4 min Left hip adductor stretch with strap 3 x 30  Patient self directs  into side lying hip abduction:  PT waits Discussed availability of local fitness facility: patient states he prefers to work out at home but does have the opportunity to use silver sneakers: encouraged patient to do so.  Frog/butterfly stretch 2 x 30 Reviewed HEP and appropriate level of exercise Added half kneeling hip IR stretching: patient did better with one foot up on table and rotating vs in half kneeling   01/15/24 Standing hamstring stretch 3 x 30 each LE Standing quad/hip flexor stretch 3 x 30  Elliptical x5' crossramp L3, resistance L8  Supine lower trunk rotation 3x10 bil Supine adductor stretch with strap x 10 holding 10 seconds Supine hasmtring stretch with strap x30 each  Supine bridge 5x10 Sidestepping red loop band 3 laps x 10 steps each way Pallof press double green tband 3x10 each way Chair sit ups 7lb dumbbell - Pt didn't connect with this Lunges x 10 each LE fwd then x 10 each LE lateral Standing bil row x25 blue tband for thoracic postural cueing  01/13/24 NuStep L5-8 x6' PT present to discuss status  Standing hamstring stretch 3 x 30 each LE Standing quad/hip flexor stretch 3 x 30  Seated piriformis stretch 3 x 30 Lunges x 10 each LE fwd then x 10 each LE lateral Lateral band walks with yellow loop x 3 laps of 10 steps Supine/hook lying hamstring pulls (1st set both feet push out, flex one at a time but patient began cramping, 2nd set individual but alternating ext/flexion in bridge position x 10 each LE Supine adductor stretch with strap x 10 holding 10 seconds Bent knee fall outs x 10 in between supine exercises   12/31/2023 Initial Evaluation & HEP created     PATIENT EDUCATION:  Education details: PT eval findings, anticipated POC, progress with PT, and initial HEP Person educated: Patient Education method: Explanation, Demonstration, and Handouts Education comprehension: verbalized understanding, returned demonstration, and needs further  education  HOME EXERCISE PROGRAM: Access Code: [email or web address] URL: [email or web address] Date: 01/15/2024 Prepared by: Orvil Beuhring  Exercises - Supine Hip Internal and External Rotation  - 1 x daily - 7 x weekly - 1 sets - 10 reps - 5 hold - Seated Hamstring Stretch  - 1 x daily - 7 x weekly - 2 sets - 10 reps - 20-30s hold - Quadricep Stretch with Chair and Counter Support  - 1 x daily - 7 x weekly - 2 sets - 20-30 hold - Supine ITB Stretch with Strap  - 1 x daily - 7 x weekly - 2 sets - 20-30 hold - Supine Hamstring Stretch with Strap  - 1 x daily - 7 x weekly - 2 sets - 20-30s hold - Supine Bridge  - 1 x daily - 7 x weekly - 2 sets - 5 reps - 10 hold - Standard Lunge  - 1 x daily - 7 x weekly - 1 sets - 10 reps - Lateral Lunge  - 1 x daily - 7 x weekly - 1 sets - 10 reps - Side Stepping with Resistance at Feet  - 1 x daily - 7 x weekly - 3 sets - 10 reps  ASSESSMENT:  CLINICAL IMPRESSION: Marc Thornton is progressing appropriately but tends to self direct.  He is aggressive with his exercise and walking schedule but his hip is extremely tight which is likely causing progressive degenerative change.  We discussed focusing most of his exercise on stretching and flexibility.   Patient would benefit from continuing skilled PT to progress toward goals below.  Eval: Patient is a 78 y.o. male who was seen today for physical therapy evaluation and treatment for left hip pain. Marc Thornton presents to skilled therapy with chronic left hip pain that recently flared up 3-4 months ago. He has pain instantly in the mornings when he gets up and it usually goes away with movement, but recently he has not gotten any relief. He walks 5x a week for 45 mins with a walking group. He enjoys gardening and doing yard work but his left hip gives him some pain but he pushes through it. His sleep is greatly disturbed by his left hip pain. Based on evaluation noted muscle weakness, decreased ROM, poor posture, and  decreased flexibility. Patient is motivated and wants to be educated on how to manage pain. Patient will benefit from skilled PT to address the below impairments and improve overall function.   OBJECTIVE IMPAIRMENTS: Abnormal gait, decreased balance, decreased coordination, decreased mobility, difficulty walking, decreased ROM, decreased strength, hypomobility, impaired perceived functional ability, increased muscle spasms, impaired flexibility, impaired sensation, improper body mechanics, postural dysfunction, and pain.   ACTIVITY LIMITATIONS: bending, sitting, standing, squatting, sleeping, stairs, dressing, hygiene/grooming, and locomotion level  PARTICIPATION LIMITATIONS: meal prep, cleaning, laundry, interpersonal relationship, community activity, and yard work  PERSONAL FACTORS: Age and 1-2 comorbidities: OA; peripheral neuropathy  are also affecting patient's functional outcome.   REHAB POTENTIAL: Good  CLINICAL DECISION MAKING: Evolving/moderate complexity  EVALUATION COMPLEXITY: Moderate   GOALS: Goals reviewed with patient? Yes  SHORT TERM GOALS: Target date: 01/28/2024  Patient will be independent with initial HEP. Baseline:  Goal status: MET 01/13/24  2.  Patient will report > or = to 30% improvement in sleep quality since starting PT. Baseline:  Goal status: INITIAL  3.  Patient will be able to perform 5STS test with no losses of balance. Baseline: anterior LOB Goal status: INITIAL  4.  Patient will be able to participate in  to establish baseline for functional test.  Baseline:  Goal status: MET 01/13/24   LONG TERM GOALS: Target date: 02/25/2024  Patient will demonstrate independence in advanced HEP. Baseline:  Goal status: INITIAL  2.  Patient will report > or = to 50% improvement in left hip pain since starting PT. Baseline:  Goal status: INITIAL  3.  Patient will be able to garden with < or = to 3/10 left hip pain. Baseline:  Goal status:  INITIAL  4.  Patient will demonstrate at least 4+/5 strength on left for improved performance of sit to stand and community negotiation. Baseline:  Goal status: INITIAL  5.  Patient will score > or = to 66/80 on LEFS due to improved function and decreased disability. Baseline: 56/80  Goal status: INITIAL    PLAN:  PT FREQUENCY: 2x/week  PT DURATION: 8 weeks  PLANNED INTERVENTIONS: [id number]- PT Re-evaluation, 97110-Therapeutic exercises, 97530- Therapeutic activity, 97112- Neuromuscular re-education, 97535- Self Care, [id number]- Manual therapy, [id number]- Gait training, [phone]- Canalith repositioning, [id number]- Aquatic Therapy, [phone]- Electrical stimulation (unattended), [phone]- Electrical stimulation (manual), [id number]- Vasopneumatic device, [id number]- Ultrasound, [id number]- Traction (mechanical), [id number]- Ionotophoresis 4mg /ml Dexamethasone, [id number] (1-2 muscles), [id number] (3+ muscles)- Dry Needling, Patient/Family education, Balance training, Stair training, Taping, Joint mobilization, Joint manipulation, Spinal manipulation, Spinal mobilization, Vestibular training, Cryotherapy, and Moist heat  PLAN FOR NEXT SESSION:  NuStep, hip flexibility; postural strengthening; gait mechanics   Kathrine Rieves B. Derhonda Eastlick, PT 01/20/24 5:41 PM [facility] [address], [address] [place], [place] [address] Phone # [phone] Fax [phone]

## 2024-01-22 ENCOUNTER — Encounter: Admitting: Physical Therapy

## 2024-01-27 ENCOUNTER — Ambulatory Visit

## 2024-01-27 DIAGNOSIS — R262 Difficulty in walking, not elsewhere classified: Secondary | ICD-10-CM | POA: Diagnosis not present

## 2024-01-27 DIAGNOSIS — M25552 Pain in left hip: Secondary | ICD-10-CM | POA: Diagnosis not present

## 2024-01-27 DIAGNOSIS — R252 Cramp and spasm: Secondary | ICD-10-CM

## 2024-01-27 DIAGNOSIS — R293 Abnormal posture: Secondary | ICD-10-CM

## 2024-01-27 DIAGNOSIS — M6281 Muscle weakness (generalized): Secondary | ICD-10-CM | POA: Diagnosis not present

## 2024-01-27 NOTE — Therapy (Signed)
 OUTPATIENT PHYSICAL THERAPY LOWER EXTREMITY TREATMENT NOTE   [name] MRN: [id number] DOB:01/05/1946, 78 y.o., male Today's Date: 01/27/2024  END OF SESSION:  PT End of Session - 01/27/24 1456     Visit Number 7    Number of Visits 16    Date for Recertification  02/25/24    Authorization Type [employer] Approved 16 vls 12/31/23-02/26/24    Authorization - Visit Number 7    Authorization - Number of Visits 16    Progress Note Due on Visit 10    PT Start Time 1448    PT Stop Time 1547    PT Time Calculation (min) 59 min    Activity Tolerance Patient tolerated treatment well    Behavior During Therapy WFL for tasks assessed/performed            Past Medical History:  Diagnosis Date   Abnormality of gait    Arthritis    Atrial fibrillation (HCC)    BPH (benign prostatic hyperplasia)    Bradycardia    Cardiomyopathy (HCC)    Cervical disc disorder    Chicken pox    Closed fracture of one or more phalanges of foot    Clotting disorder    low platelets   Combined systolic and diastolic cardiac dysfunction    DDD (degenerative disc disease), cervical    Depression    Elevated PSA    Fracture of foot bone, right, closed 1964   GERD (gastroesophageal reflux disease)    Hammer toe    Hayfever    Hearing loss    Heart murmur    Hyperlipidemia    Neoplasm of uncertain behavior of skin    Pilonidal cyst with abscess    Polyneuropathy    Rheumatic fever    Seasonal allergies    Talipes cavus    Tinnitus    Past Surgical History:  Procedure Laterality Date   COLONOSCOPY     2006 normal exam   INGUINAL HERNIA REPAIR     PILONIDAL CYST EXCISION  1966   WISDOM TOOTH EXTRACTION     Patient Active Problem List   Diagnosis Date Noted   Malignant neoplasm of prostate (HCC) 04/19/2022   Bilateral carpal tunnel syndrome 08/18/2020   Midline low back pain without sciatica 09/30/2019   Atrophy of muscle of right lower leg 09/13/2019   Lumbar  radiculopathy 09/13/2019   Peripheral neuropathy 09/13/2019   Gait abnormality 08/19/2019   Paresthesia 08/19/2019   Neck pain 08/19/2019   Memory loss 08/19/2019   Chronic diastolic heart failure (HCC) 03/21/2016   Murmur, cardiac 01/15/2016   Dyspnea 01/15/2016   Abnormal ECG 01/15/2016   Hypertriglyceridemia 01/15/2016    PCP: Merna Huxley, NP  REFERRING PROVIDER: Merna Huxley, NP  REFERRING DIAG: [phone] (ICD-10-CM) - Left leg pain   THERAPY DIAG:  Pain in left hip  Muscle weakness (generalized)  Difficulty in walking, not elsewhere classified  Cramp and spasm  Abnormal posture  Rationale for Evaluation and Treatment: Rehabilitation  ONSET DATE: 3-4 months ago  SUBJECTIVE:   SUBJECTIVE STATEMENT: Patient reports he aches in his quads and knees.    Eval: Patient presents with left hip pain that is usually worse in the mornings. He went to Dr. 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 He reports Dr. Hiram said he does not need a hip replacement now, but he will in the future. He went on two trips to [place] and the required  a lot of sitting. He struggled to sit in the very low chairs and he would like to be able to do that. He walks 5x a week with a group for 45 mins.   PERTINENT HISTORY: Peripheral neuropathy ; OA; Depression; DDD; Hx low back pain PAIN:  01/20/24 Are you having pain? Yes: NPRS scale: 2-3 Pain location: front and side of his left quad Pain description: achy Aggravating factors: getting out of bed; walking;  Relieving factors: stretching   PRECAUTIONS: None  RED FLAGS: None   WEIGHT BEARING RESTRICTIONS: No  FALLS:  Has patient fallen in last 6 months? No  LIVING ENVIRONMENT: Lives with: lives alone Lives in: House/apartment Stairs: No Has following equipment at home: He has a cane, walker, shower chair, but he does not use anything  OCCUPATION: Retired   PLOF: Independent, Independent with basic ADLs,  Independent with household mobility without device, Independent with community mobility without device, Independent with gait, Independent with transfers, and Leisure: Working in the yard, garden , working on cars  PATIENT GOALS: To get rid of pain  NEXT MD VISIT: PRN  OBJECTIVE:  Note: Objective measures were completed at Evaluation unless otherwise noted.  DIAGNOSTIC FINDINGS: Patient reports Lt hip arthritis (Dr. Hiram)   PATIENT SURVEYS:  LEFS: 56/80 70%   COGNITION: Overall cognitive status: Within functional limits for tasks assessed     SENSATION: Patient had peripheral neuropathy   MUSCLE LENGTH: Hamstrings: decreased bilateral Lt > Rt    POSTURE: rounded shoulders, forward head, increased thoracic kyphosis, and weight shift right  PALPATION: Tenderness around Lt hip joint. Patient has a deep achy pain in left hip pain.  LOWER EXTREMITY ROM: Left hip PROM limited in all directions  Lt >Rt  ; patient unable to achieve FABER position on left. Patient has pain at end ranges.  LOWER EXTREMITY MMT: Grossly 4+/5 on right; 4 to 4+/5 on Lt some pain with left hip abduction testing;   FUNCTIONAL TESTS:  5 times sit to stand: 9.18 sec no UE support;  uncontrolled descend; anterior LOB requiring stepping strategy   Timed up and go (TUG): 5.00 sec     : 1757.2 feet GAIT:  Comments: Right lateral trunk lean; antalgic gait                                                                                                                               TREATMENT DATE:  01/27/24 Nustep x 6 min level 6 increased to level 7 at approx 5 min Standing hamstring stretch 3 x 30 each LE Standing quad/hip flexor stretch 3 x 30  Seated piriformis stretch 3 x 30  HEAVY education on avoiding overly aggressive stretching as this creates pain reflex and muscle guarding/shortening vs elongating Educated on changes in the body with age including tissue changes, natural fusions and  overall rigidity.  Educated patient on proper care for spine and joints to reduce progression of degenerative changes.  Supine IT band stretch 3 x 30 seconds both LE's, patient continued to need vc's on avoiding overly aggressive stretching.  Left hip IT band rolling x 3-4 min Left hip IT band and glut STM with theragun x 3-4 min Butterfly stretch in supine x 5 Patient instructed that visit was complete but he was welcome to rest before leaving.  He remained for an extended period of time and had questions on specific stretching for anterior hip.    01/20/24 Nustep x 6 min level 6 increased to level 7 at approx 5 min Standing hamstring stretch 3 x 30 each LE Standing quad/hip flexor stretch 3 x 30  Supine IT band stretch 3 x 30 seconds Left hip IT band rolling x 3-4 min Left hip IT band and glut STM with theragun x 3-4 min Left hip adductor stretch with strap 3 x 30  Patient self directs into side lying hip abduction: PT waits Discussed availability of local fitness facility: patient states he prefers to work out at home but does have the opportunity to use silver sneakers: encouraged patient to do so.  Frog/butterfly stretch 2 x 30 Reviewed HEP and appropriate level of exercise Added half kneeling hip IR stretching: patient did better with one foot up on table and rotating vs in half kneeling   01/15/24 Standing hamstring stretch 3 x 30 each LE Standing quad/hip flexor stretch 3 x 30  Elliptical x5' crossramp L3, resistance L8  Supine lower trunk rotation 3x10 bil Supine adductor stretch with strap x 10 holding 10 seconds Supine hasmtring stretch with strap x30 each  Supine bridge 5x10 Sidestepping red loop band 3 laps x 10 steps each way Pallof press double green tband 3x10 each way Chair sit ups 7lb dumbbell - Pt didn't connect with this Lunges x 10 each LE fwd then x 10 each LE lateral Standing bil row x25 blue tband for thoracic postural cueing  12/31/2023 Initial  Evaluation & HEP created     PATIENT EDUCATION:  Education details: PT eval findings, anticipated POC, progress with PT, and initial HEP Person educated: Patient Education method: Explanation, Demonstration, and Handouts Education comprehension: verbalized understanding, returned demonstration, and needs further education  HOME EXERCISE PROGRAM: Access Code: [email or web address] URL: [email or web address] Date: 01/15/2024 Prepared by: Orvil Beuhring  Exercises - Supine Hip Internal and External Rotation  - 1 x daily - 7 x weekly - 1 sets - 10 reps - 5 hold - Seated Hamstring Stretch  - 1 x daily - 7 x weekly - 2 sets - 10 reps - 20-30s hold - Quadricep Stretch with Chair and Counter Support  - 1 x daily - 7 x weekly - 2 sets - 20-30 hold - Supine ITB Stretch with Strap  - 1 x daily - 7 x weekly - 2 sets - 20-30 hold - Supine Hamstring Stretch with Strap  - 1 x daily - 7 x weekly - 2 sets - 20-30s hold - Supine Bridge  - 1 x daily - 7 x weekly - 2 sets - 5 reps - 10 hold - Standard Lunge  - 1 x daily - 7 x weekly - 1 sets - 10 reps - Lateral Lunge  - 1 x daily - 7 x weekly - 1 sets - 10 reps - Side Stepping with Resistance at Feet  - 1 x daily - 7 x weekly - 3 sets - 10 reps  ASSESSMENT:  CLINICAL IMPRESSION: Marc Thornton needed frequent reminders to avoid overly aggressive stretching.  He  has observable end range degenerative hip and knee issues but does not want to do surgery.  He continues to need reminders to avoid self directing.  Patient would benefit from continuing skilled PT to progress toward goals below.    Eval: Patient is a 78 y.o. male who was seen today for physical therapy evaluation and treatment for left hip pain. Asriel presents to skilled therapy with chronic left hip pain that recently flared up 3-4 months ago. He has pain instantly in the mornings when he gets up and it usually goes away with movement, but recently he has not gotten any relief. He walks 5x a week for  45 mins with a walking group. He enjoys gardening and doing yard work but his left hip gives him some pain but he pushes through it. His sleep is greatly disturbed by his left hip pain. Based on evaluation noted muscle weakness, decreased ROM, poor posture, and decreased flexibility. Patient is motivated and wants to be educated on how to manage pain. Patient will benefit from skilled PT to address the below impairments and improve overall function.   OBJECTIVE IMPAIRMENTS: Abnormal gait, decreased balance, decreased coordination, decreased mobility, difficulty walking, decreased ROM, decreased strength, hypomobility, impaired perceived functional ability, increased muscle spasms, impaired flexibility, impaired sensation, improper body mechanics, postural dysfunction, and pain.   ACTIVITY LIMITATIONS: bending, sitting, standing, squatting, sleeping, stairs, dressing, hygiene/grooming, and locomotion level  PARTICIPATION LIMITATIONS: meal prep, cleaning, laundry, interpersonal relationship, community activity, and yard work  PERSONAL FACTORS: Age and 1-2 comorbidities: OA; peripheral neuropathy  are also affecting patient's functional outcome.   REHAB POTENTIAL: Good  CLINICAL DECISION MAKING: Evolving/moderate complexity  EVALUATION COMPLEXITY: Moderate   GOALS: Goals reviewed with patient? Yes  SHORT TERM GOALS: Target date: 01/28/2024  Patient will be independent with initial HEP. Baseline:  Goal status: MET 01/13/24  2.  Patient will report > or = to 30% improvement in sleep quality since starting PT. Baseline:  Goal status: INITIAL  3.  Patient will be able to perform 5STS test with no losses of balance. Baseline: anterior LOB Goal status: In progress  4.  Patient will be able to participate in  to establish baseline for functional test.  Baseline:  Goal status: MET 01/13/24   LONG TERM GOALS: Target date: 02/25/2024  Patient will demonstrate independence in advanced  HEP. Baseline:  Goal status: INITIAL  2.  Patient will report > or = to 50% improvement in left hip pain since starting PT. Baseline:  Goal status: INITIAL  3.  Patient will be able to garden with < or = to 3/10 left hip pain. Baseline:  Goal status: INITIAL  4.  Patient will demonstrate at least 4+/5 strength on left for improved performance of sit to stand and community negotiation. Baseline:  Goal status: INITIAL  5.  Patient will score > or = to 66/80 on LEFS due to improved function and decreased disability. Baseline: 56/80  Goal status: INITIAL    PLAN:  PT FREQUENCY: 2x/week  PT DURATION: 8 weeks  PLANNED INTERVENTIONS: [id number]- PT Re-evaluation, 97110-Therapeutic exercises, 97530- Therapeutic activity, 97112- Neuromuscular re-education, 97535- Self Care, [id number]- Manual therapy, [phone]- Gait training, [phone]- Canalith repositioning, [id number]- Aquatic Therapy, [phone]- Electrical stimulation (unattended), [phone]- Electrical stimulation (manual), [id number]- Vasopneumatic device, [id number]- Ultrasound, [id number]- Traction (mechanical), [id number]- Ionotophoresis 4mg /ml Dexamethasone, 79439 (1-2 muscles), [id number] (3+ muscles)- Dry Needling, Patient/Family education, Balance training, Stair training, Taping, Joint mobilization, Joint manipulation, Spinal manipulation, Spinal mobilization, Vestibular training, Cryotherapy, and Moist  heat  PLAN FOR NEXT SESSION:  NuStep, hip flexibility; postural strengthening; gait mechanics   Cormac Wint B. Gwenith Tschida, PT 01/27/24 9:03 PM [facility] [address] [place], [place] [address] Phone # [phone] Fax [phone]

## 2024-01-29 ENCOUNTER — Encounter (INDEPENDENT_AMBULATORY_CARE_PROVIDER_SITE_OTHER): Payer: Self-pay

## 2024-01-29 ENCOUNTER — Ambulatory Visit

## 2024-01-29 DIAGNOSIS — R252 Cramp and spasm: Secondary | ICD-10-CM

## 2024-01-29 DIAGNOSIS — M25552 Pain in left hip: Secondary | ICD-10-CM | POA: Diagnosis not present

## 2024-01-29 DIAGNOSIS — M6281 Muscle weakness (generalized): Secondary | ICD-10-CM | POA: Diagnosis not present

## 2024-01-29 DIAGNOSIS — R293 Abnormal posture: Secondary | ICD-10-CM

## 2024-01-29 DIAGNOSIS — R262 Difficulty in walking, not elsewhere classified: Secondary | ICD-10-CM

## 2024-01-29 NOTE — Therapy (Signed)
 OUTPATIENT PHYSICAL THERAPY LOWER EXTREMITY TREATMENT NOTE   [name] MRN: [id number] DOB:06/18/1945, 78 y.o., male Today's Date: 01/29/2024  END OF SESSION:  PT End of Session - 01/29/24 1453     Visit Number 8    Number of Visits 16    Date for Recertification  02/25/24    Authorization Type [employer] Approved 16 vls 12/31/23-02/26/24    Authorization - Visit Number 8    Authorization - Number of Visits 16    Progress Note Due on Visit 10    PT Start Time 1445    PT Stop Time 1530    PT Time Calculation (min) 45 min    Activity Tolerance Patient tolerated treatment well    Behavior During Therapy WFL for tasks assessed/performed            Past Medical History:  Diagnosis Date   Abnormality of gait    Arthritis    Atrial fibrillation (HCC)    BPH (benign prostatic hyperplasia)    Bradycardia    Cardiomyopathy (HCC)    Cervical disc disorder    Chicken pox    Closed fracture of one or more phalanges of foot    Clotting disorder    low platelets   Combined systolic and diastolic cardiac dysfunction    DDD (degenerative disc disease), cervical    Depression    Elevated PSA    Fracture of foot bone, right, closed 1964   GERD (gastroesophageal reflux disease)    Hammer toe    Hayfever    Hearing loss    Heart murmur    Hyperlipidemia    Neoplasm of uncertain behavior of skin    Pilonidal cyst with abscess    Polyneuropathy    Rheumatic fever    Seasonal allergies    Talipes cavus    Tinnitus    Past Surgical History:  Procedure Laterality Date   COLONOSCOPY     2006 normal exam   INGUINAL HERNIA REPAIR     PILONIDAL CYST EXCISION  1966   WISDOM TOOTH EXTRACTION     Patient Active Problem List   Diagnosis Date Noted   Malignant neoplasm of prostate (HCC) 04/19/2022   Bilateral carpal tunnel syndrome 08/18/2020   Midline low back pain without sciatica 09/30/2019   Atrophy of muscle of right lower leg 09/13/2019   Lumbar  radiculopathy 09/13/2019   Peripheral neuropathy 09/13/2019   Gait abnormality 08/19/2019   Paresthesia 08/19/2019   Neck pain 08/19/2019   Memory loss 08/19/2019   Chronic diastolic heart failure (HCC) 03/21/2016   Murmur, cardiac 01/15/2016   Dyspnea 01/15/2016   Abnormal ECG 01/15/2016   Hypertriglyceridemia 01/15/2016    PCP: Merna Huxley, NP  REFERRING PROVIDER: Merna Huxley, NP  REFERRING DIAG: [phone] (ICD-10-CM) - Left leg pain   THERAPY DIAG:  Pain in left hip  Difficulty in walking, not elsewhere classified  Muscle weakness (generalized)  Cramp and spasm  Abnormal posture  Rationale for Evaluation and Treatment: Rehabilitation  ONSET DATE: 3-4 months ago  SUBJECTIVE:   SUBJECTIVE STATEMENT: Patient reports he is having some pain in the left low back and hip area.  Not sure what I did    Eval: Patient presents with left hip pain that is usually worse in the mornings. He went to Dr. 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 He reports Dr. Hiram said he does not need a hip replacement now, but he will in  the future. He went on two trips to [place] and the required a lot of sitting. He struggled to sit in the very low chairs and he would like to be able to do that. He walks 5x a week with a group for 45 mins.   PERTINENT HISTORY: Peripheral neuropathy ; OA; Depression; DDD; Hx low back pain PAIN:  01/29/24 Are you having pain? Yes: NPRS scale: 2-3 Pain location: front and side of his left quad Pain description: achy Aggravating factors: getting out of bed; walking;  Relieving factors: stretching   PRECAUTIONS: None  RED FLAGS: None   WEIGHT BEARING RESTRICTIONS: No  FALLS:  Has patient fallen in last 6 months? No  LIVING ENVIRONMENT: Lives with: lives alone Lives in: House/apartment Stairs: No Has following equipment at home: He has a cane, walker, shower chair, but he does not use anything  OCCUPATION: Retired    PLOF: Independent, Independent with basic ADLs, Independent with household mobility without device, Independent with community mobility without device, Independent with gait, Independent with transfers, and Leisure: Working in the yard, garden , working on cars  PATIENT GOALS: To get rid of pain  NEXT MD VISIT: PRN  OBJECTIVE:  Note: Objective measures were completed at Evaluation unless otherwise noted.  DIAGNOSTIC FINDINGS: Patient reports Lt hip arthritis (Dr. Hiram)   PATIENT SURVEYS:  LEFS: 56/80 70%   COGNITION: Overall cognitive status: Within functional limits for tasks assessed     SENSATION: Patient had peripheral neuropathy   MUSCLE LENGTH: Hamstrings: decreased bilateral Lt > Rt    POSTURE: rounded shoulders, forward head, increased thoracic kyphosis, and weight shift right  PALPATION: Tenderness around Lt hip joint. Patient has a deep achy pain in left hip pain.  LOWER EXTREMITY ROM: Left hip PROM limited in all directions  Lt >Rt  ; patient unable to achieve FABER position on left. Patient has pain at end ranges.  LOWER EXTREMITY MMT: Grossly 4+/5 on right; 4 to 4+/5 on Lt some pain with left hip abduction testing;   FUNCTIONAL TESTS:  5 times sit to stand: 9.18 sec no UE support;  uncontrolled descend; anterior LOB requiring stepping strategy   Timed up and go (TUG): 5.00 sec     : 1757.2 feet GAIT:  Comments: Right lateral trunk lean; antalgic gait                                                                                                                               TREATMENT DATE:  01/29/24 Nustep x 6 min level 6 increased to level 7 at approx 5 min Seated piriformis stretch 3 x 30 HEAVY education on self directing and trying to seek guidance from the internet.   Reminders re: changes in the body with age including tissue changes, natural fusions and overall rigidity.  Educated patient on proper care for spine and joints to reduce  progression of degenerative changes.   Lengthy discussion about whether he  needs skilled care as he tends to self direct over most instructions given by PT.   Patient instructed that visit was complete but he wanted to stay and do more stretching since we didn't get to our usual stretches.  Explained to patient that we typically do not do this because he should be supervised while doing this but that since I would be in the room and have visual on him, he could do the stretches that we have previously instructed him on and that PT has observed independence with.  Explained that other patients would be coming in so we would only allow a few more minutes.  Patient was observed doing hamstring stretches and hip flexor/quad stretches properly and then he left.    01/27/24 Nustep x 6 min level 6 increased to level 7 at approx 5 min Standing hamstring stretch 3 x 30 each LE Standing quad/hip flexor stretch 3 x 30  Seated piriformis stretch 3 x 30  HEAVY education on avoiding overly aggressive stretching as this creates pain reflex and muscle guarding/shortening vs elongating Educated on changes in the body with age including tissue changes, natural fusions and overall rigidity.  Educated patient on proper care for spine and joints to reduce progression of degenerative changes.   Supine IT band stretch 3 x 30 seconds both LE's, patient continued to need vc's on avoiding overly aggressive stretching.  Left hip IT band rolling x 3-4 min Left hip IT band and glut STM with theragun x 3-4 min Butterfly stretch in supine x 5 Patient instructed that visit was complete but he was welcome to rest before leaving.  He remained for an extended period of time and had questions on specific stretching for anterior hip.    01/20/24 Nustep x 6 min level 6 increased to level 7 at approx 5 min Standing hamstring stretch 3 x 30 each LE Standing quad/hip flexor stretch 3 x 30  Supine IT band stretch 3 x 30  seconds Left hip IT band rolling x 3-4 min Left hip IT band and glut STM with theragun x 3-4 min Left hip adductor stretch with strap 3 x 30  Patient self directs into side lying hip abduction: PT waits Discussed availability of local fitness facility: patient states he prefers to work out at home but does have the opportunity to use silver sneakers: encouraged patient to do so.  Frog/butterfly stretch 2 x 30 Reviewed HEP and appropriate level of exercise Added half kneeling hip IR stretching: patient did better with one foot up on table and rotating vs in half kneeling   01/15/24 Standing hamstring stretch 3 x 30 each LE Standing quad/hip flexor stretch 3 x 30  Elliptical x5' crossramp L3, resistance L8  Supine lower trunk rotation 3x10 bil Supine adductor stretch with strap x 10 holding 10 seconds Supine hasmtring stretch with strap x30 each  Supine bridge 5x10 Sidestepping red loop band 3 laps x 10 steps each way Pallof press double green tband 3x10 each way Chair sit ups 7lb dumbbell - Pt didn't connect with this Lunges x 10 each LE fwd then x 10 each LE lateral Standing bil row x25 blue tband for thoracic postural cueing  12/31/2023 Initial Evaluation & HEP created     PATIENT EDUCATION:  Education details: PT eval findings, anticipated POC, progress with PT, and initial HEP Person educated: Patient Education method: Explanation, Demonstration, and Handouts Education comprehension: verbalized understanding, returned demonstration, and needs further education  HOME EXERCISE PROGRAM: Access Code:  [email or web address] URL: [email or web address] Date: 01/15/2024 Prepared by: Orvil Beuhring  Exercises - Supine Hip Internal and External Rotation  - 1 x daily - 7 x weekly - 1 sets - 10 reps - 5 hold - Seated Hamstring Stretch  - 1 x daily - 7 x weekly - 2 sets - 10 reps - 20-30s hold - Quadricep Stretch with Chair and Counter Support  - 1 x daily - 7 x weekly - 2  sets - 20-30 hold - Supine ITB Stretch with Strap  - 1 x daily - 7 x weekly - 2 sets - 20-30 hold - Supine Hamstring Stretch with Strap  - 1 x daily - 7 x weekly - 2 sets - 20-30s hold - Supine Bridge  - 1 x daily - 7 x weekly - 2 sets - 5 reps - 10 hold - Standard Lunge  - 1 x daily - 7 x weekly - 1 sets - 10 reps - Lateral Lunge  - 1 x daily - 7 x weekly - 1 sets - 10 reps - Side Stepping with Resistance at Feet  - 1 x daily - 7 x weekly - 3 sets - 10 reps  ASSESSMENT:  CLINICAL IMPRESSION: Rodriquez continues to need reminders to avoid self directing.  He does not seem to understand the limitations of his level of rigidity and arthritic condition of his hips and knees.  His approach to overcoming his current condition is to be more aggressive with his stretching and strengthening to the point where he creates an adverse effect on his body thus impeding his progress toward the goals that were set.  Patient may benefit from continuing skilled PT to progress toward goals below, but will need to follow instructions from skilled provider vs self directing.    Eval: Patient is a 78 y.o. male who was seen today for physical therapy evaluation and treatment for left hip pain. Nolan presents to skilled therapy with chronic left hip pain that recently flared up 3-4 months ago. He has pain instantly in the mornings when he gets up and it usually goes away with movement, but recently he has not gotten any relief. He walks 5x a week for 45 mins with a walking group. He enjoys gardening and doing yard work but his left hip gives him some pain but he pushes through it. His sleep is greatly disturbed by his left hip pain. Based on evaluation noted muscle weakness, decreased ROM, poor posture, and decreased flexibility. Patient is motivated and wants to be educated on how to manage pain. Patient will benefit from skilled PT to address the below impairments and improve overall function.   OBJECTIVE IMPAIRMENTS: Abnormal  gait, decreased balance, decreased coordination, decreased mobility, difficulty walking, decreased ROM, decreased strength, hypomobility, impaired perceived functional ability, increased muscle spasms, impaired flexibility, impaired sensation, improper body mechanics, postural dysfunction, and pain.   ACTIVITY LIMITATIONS: bending, sitting, standing, squatting, sleeping, stairs, dressing, hygiene/grooming, and locomotion level  PARTICIPATION LIMITATIONS: meal prep, cleaning, laundry, interpersonal relationship, community activity, and yard work  PERSONAL FACTORS: Age and 1-2 comorbidities: OA; peripheral neuropathy  are also affecting patient's functional outcome.   REHAB POTENTIAL: Good  CLINICAL DECISION MAKING: Evolving/moderate complexity  EVALUATION COMPLEXITY: Moderate   GOALS: Goals reviewed with patient? Yes  SHORT TERM GOALS: Target date: 01/28/2024  Patient will be independent with initial HEP. Baseline:  Goal status: MET 01/13/24  2.  Patient will report > or = to 30% improvement in sleep quality since starting PT.  Baseline:  Goal status: INITIAL  3.  Patient will be able to perform 5STS test with no losses of balance. Baseline: anterior LOB Goal status: MET 01/29/24  4.  Patient will be able to participate in  to establish baseline for functional test.  Baseline:  Goal status: MET 01/13/24   LONG TERM GOALS: Target date: 02/25/2024  Patient will demonstrate independence in advanced HEP. Baseline:  Goal status: In progress  2.  Patient will report > or = to 50% improvement in left hip pain since starting PT. Baseline:  Goal status: In Progress  3.  Patient will be able to garden with < or = to 3/10 left hip pain. Baseline:  Goal status: In Progress  4.  Patient will demonstrate at least 4+/5 strength on left for improved performance of sit to stand and community negotiation. Baseline:  Goal status: In Progress  5.  Patient will score > or = to  66/80 on LEFS due to improved function and decreased disability. Baseline: 56/80  Goal status: In progress    PLAN:  PT FREQUENCY: 2x/week  PT DURATION: 8 weeks  PLANNED INTERVENTIONS: 97164- PT Re-evaluation, 97110-Therapeutic exercises, 97530- Therapeutic activity, 97112- Neuromuscular re-education, 97535- Self Care, 02859- Manual therapy, [phone]- Gait training, [phone]- Canalith repositioning, [id number]- Aquatic Therapy, [phone]- Electrical stimulation (unattended), [phone]- Electrical stimulation (manual), [id number]- Vasopneumatic device, [id number]- Ultrasound, [id number]- Traction (mechanical), [id number]- Ionotophoresis 4mg /ml Dexamethasone, 20560 (1-2 muscles), 20561 (3+ muscles)- Dry Needling, Patient/Family education, Balance training, Stair training, Taping, Joint mobilization, Joint manipulation, Spinal manipulation, Spinal mobilization, Vestibular training, Cryotherapy, and Moist heat  PLAN FOR NEXT SESSION:  NuStep, hip flexibility; postural strengthening; gait mechanics   Annajulia Lewing B. Skarlett Sedlacek, PT 01/29/24 5:40 PM [facility] [address] [place], [place] [address] Phone # [phone] Fax [phone]

## 2024-02-03 ENCOUNTER — Ambulatory Visit

## 2024-02-03 DIAGNOSIS — M6281 Muscle weakness (generalized): Secondary | ICD-10-CM

## 2024-02-03 DIAGNOSIS — R293 Abnormal posture: Secondary | ICD-10-CM | POA: Diagnosis not present

## 2024-02-03 DIAGNOSIS — M25552 Pain in left hip: Secondary | ICD-10-CM | POA: Diagnosis not present

## 2024-02-03 DIAGNOSIS — R252 Cramp and spasm: Secondary | ICD-10-CM | POA: Diagnosis not present

## 2024-02-03 DIAGNOSIS — R262 Difficulty in walking, not elsewhere classified: Secondary | ICD-10-CM | POA: Diagnosis not present

## 2024-02-03 NOTE — Therapy (Signed)
 OUTPATIENT PHYSICAL THERAPY LOWER EXTREMITY TREATMENT NOTE   [name] MRN: [id number] DOB:1945/09/25, 78 y.o., male Today's Date: 02/03/2024  END OF SESSION:  PT End of Session - 02/03/24 1459     Visit Number 9    Date for Recertification  02/25/24    Authorization Type [employer] Approved 16 vls 12/31/23-02/26/24    Authorization - Visit Number 9    Authorization - Number of Visits 16    Progress Note Due on Visit 10    PT Start Time 1445    PT Stop Time 1540    PT Time Calculation (min) 55 min    Activity Tolerance Patient tolerated treatment well    Behavior During Therapy WFL for tasks assessed/performed            Past Medical History:  Diagnosis Date   Abnormality of gait    Arthritis    Atrial fibrillation (HCC)    BPH (benign prostatic hyperplasia)    Bradycardia    Cardiomyopathy (HCC)    Cervical disc disorder    Chicken pox    Closed fracture of one or more phalanges of foot    Clotting disorder    low platelets   Combined systolic and diastolic cardiac dysfunction    DDD (degenerative disc disease), cervical    Depression    Elevated PSA    Fracture of foot bone, right, closed 1964   GERD (gastroesophageal reflux disease)    Hammer toe    Hayfever    Hearing loss    Heart murmur    Hyperlipidemia    Neoplasm of uncertain behavior of skin    Pilonidal cyst with abscess    Polyneuropathy    Rheumatic fever    Seasonal allergies    Talipes cavus    Tinnitus    Past Surgical History:  Procedure Laterality Date   COLONOSCOPY     2006 normal exam   INGUINAL HERNIA REPAIR     PILONIDAL CYST EXCISION  1966   WISDOM TOOTH EXTRACTION     Patient Active Problem List   Diagnosis Date Noted   Malignant neoplasm of prostate (HCC) 04/19/2022   Bilateral carpal tunnel syndrome 08/18/2020   Midline low back pain without sciatica 09/30/2019   Atrophy of muscle of right lower leg 09/13/2019   Lumbar radiculopathy 09/13/2019    Peripheral neuropathy 09/13/2019   Gait abnormality 08/19/2019   Paresthesia 08/19/2019   Neck pain 08/19/2019   Memory loss 08/19/2019   Chronic diastolic heart failure (HCC) 03/21/2016   Murmur, cardiac 01/15/2016   Dyspnea 01/15/2016   Abnormal ECG 01/15/2016   Hypertriglyceridemia 01/15/2016    PCP: Marc Huxley, NP  REFERRING PROVIDER: Merna Huxley, NP  REFERRING DIAG: [phone] (ICD-10-CM) - Left leg pain   THERAPY DIAG:  Pain in left hip  Difficulty in walking, not elsewhere classified  Muscle weakness (generalized)  Cramp and spasm  Abnormal posture  Rationale for Evaluation and Treatment: Rehabilitation  ONSET DATE: 3-4 months ago  SUBJECTIVE:   SUBJECTIVE STATEMENT: Patient reports he continues to feel left lateral hip pain. (Refers to bursa)   Eval: Patient presents with left hip pain that is usually worse in the mornings. He went to Dr. 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 He reports Dr. Hiram said he does not need a hip replacement now, but he will in the future. He went on two trips to [place] and the required a lot of sitting.  He struggled to sit in the very low chairs and he would like to be able to do that. He walks 5x a week with a group for 45 mins.   PERTINENT HISTORY: Peripheral neuropathy ; OA; Depression; DDD; Hx low back pain PAIN:  01/29/24 Are you having pain? Yes: NPRS scale: 2-3 Pain location: front and side of his left quad Pain description: achy Aggravating factors: getting out of bed; walking;  Relieving factors: stretching   PRECAUTIONS: None  RED FLAGS: None   WEIGHT BEARING RESTRICTIONS: No  FALLS:  Has patient fallen in last 6 months? No  LIVING ENVIRONMENT: Lives with: lives alone Lives in: House/apartment Stairs: No Has following equipment at home: He has a cane, walker, shower chair, but he does not use anything  OCCUPATION: Retired   PLOF: Independent, Independent with basic ADLs,  Independent with household mobility without device, Independent with community mobility without device, Independent with gait, Independent with transfers, and Leisure: Working in the yard, garden , working on cars  PATIENT GOALS: To get rid of pain  NEXT MD VISIT: PRN  OBJECTIVE:  Note: Objective measures were completed at Evaluation unless otherwise noted.  DIAGNOSTIC FINDINGS: Patient reports Lt hip arthritis (Dr. Hiram)   PATIENT SURVEYS:  LEFS: 56/80 70%   COGNITION: Overall cognitive status: Within functional limits for tasks assessed     SENSATION: Patient had peripheral neuropathy   MUSCLE LENGTH: Hamstrings: decreased bilateral Lt > Rt    POSTURE: rounded shoulders, forward head, increased thoracic kyphosis, and weight shift right  PALPATION: Tenderness around Lt hip joint. Patient has a deep achy pain in left hip pain.  LOWER EXTREMITY ROM: Left hip PROM limited in all directions  Lt >Rt  ; patient unable to achieve FABER position on left. Patient has pain at end ranges.  LOWER EXTREMITY MMT: Grossly 4+/5 on right; 4 to 4+/5 on Lt some pain with left hip abduction testing;   FUNCTIONAL TESTS:  5 times sit to stand: 9.18 sec no UE support;  uncontrolled descend; anterior LOB requiring stepping strategy   Timed up and go (TUG): 5.00 sec     : 1757.2 feet GAIT:  Comments: Right lateral trunk lean; antalgic gait                                                                                                                               TREATMENT DATE:  02/03/24 Nustep x 6 min level 6 increased to level 7  and then to level 8 during the 6 min time frame (had discussion about high output vs quality exercise) Standing hamstring stretch 2 x 30 each LE Marc Thornton stretch 3 x 30 each LE Supine combo hip IR/ER stretch x 5 each side holding 10 sec each Hip IT band and adductor stretch 2 x 30 each LE We spend a fair amount of time educating Marc Thornton to clarify  his understanding the why for each exercise we do  as he has a misunderstanding of the correct way to exercise.   Ice to left lateral hip in side lying x 10 min at end of session   01/29/24 Nustep x 6 min level 6 increased to level 7 at approx 5 min Seated piriformis stretch 3 x 30 HEAVY education on self directing and trying to seek guidance from the internet.   Reminders re: changes in the body with age including tissue changes, natural fusions and overall rigidity.  Educated patient on proper care for spine and joints to reduce progression of degenerative changes.   Lengthy discussion about whether he needs skilled care as he tends to self direct over most instructions given by PT.   Patient instructed that visit was complete but he wanted to stay and do more stretching since we didn't get to our usual stretches.  Explained to patient that we typically do not do this because he should be supervised while doing this but that since I would be in the room and have visual on him, he could do the stretches that we have previously instructed him on and that PT has observed independence with.  Explained that other patients would be coming in so we would only allow a few more minutes.  Patient was observed doing hamstring stretches and hip flexor/quad stretches properly and then he left.    01/27/24 Nustep x 6 min level 6 increased to level 7 at approx 5 min Standing hamstring stretch 3 x 30 each LE Standing quad/hip flexor stretch 3 x 30  Seated piriformis stretch 3 x 30  HEAVY education on avoiding overly aggressive stretching as this creates pain reflex and muscle guarding/shortening vs elongating Educated on changes in the body with age including tissue changes, natural fusions and overall rigidity.  Educated patient on proper care for spine and joints to reduce progression of degenerative changes.   Supine IT band stretch 3 x 30 seconds both LE's, patient continued to need vc's on avoiding  overly aggressive stretching.  Left hip IT band rolling x 3-4 min Left hip IT band and glut STM with theragun x 3-4 min Butterfly stretch in supine x 5 Patient instructed that visit was complete but he was welcome to rest before leaving.  He remained for an extended period of time and had questions on specific stretching for anterior hip.   12/31/2023 Initial Evaluation & HEP created     PATIENT EDUCATION:  Education details: PT eval findings, anticipated POC, progress with PT, and initial HEP Person educated: Patient Education method: Explanation, Demonstration, and Handouts Education comprehension: verbalized understanding, returned demonstration, and needs further education  HOME EXERCISE PROGRAM: Access Code: [email or web address] URL: [email or web address] Date: 01/15/2024 Prepared by: Orvil Beuhring  Exercises - Supine Hip Internal and External Rotation  - 1 x daily - 7 x weekly - 1 sets - 10 reps - 5 hold - Seated Hamstring Stretch  - 1 x daily - 7 x weekly - 2 sets - 10 reps - 20-30s hold - Quadricep Stretch with Chair and Counter Support  - 1 x daily - 7 x weekly - 2 sets - 20-30 hold - Supine ITB Stretch with Strap  - 1 x daily - 7 x weekly - 2 sets - 20-30 hold - Supine Hamstring Stretch with Strap  - 1 x daily - 7 x weekly - 2 sets - 20-30s hold - Supine Bridge  - 1 x daily - 7 x weekly - 2 sets - 5 reps - 10 hold -  Standard Lunge  - 1 x daily - 7 x weekly - 1 sets - 10 reps - Lateral Lunge  - 1 x daily - 7 x weekly - 1 sets - 10 reps - Side Stepping with Resistance at Feet  - 1 x daily - 7 x weekly - 3 sets - 10 reps  ASSESSMENT:  CLINICAL IMPRESSION: Subhan continues to experience left lateral hip pain but did finally let his hip rest and avoided any aggressive exercise for the past 2 days.  He states he has been able to return to walking with his friends but that one of them had been sick so this gave him some to rest.  He was more receptive to instruction today but  does tend to revert back to what he has learned from the internet at times.  This interferes with the treatment time due to PT having to stop and explain why his understanding of what he read does not apply to him because we have to consider his specific condition.  Explained to him that strength gain is inhibited by pain and when the joint is at degenerative end stage, the muscle will not build significantly.  A joint replacement is typically done at a reasonable time but he has opted not to do this or follow up with Dr. Melodi.   His current pain appears to be more bursa related but this is likely due to all of the musculature around the hip being so rigid and tight from compensatory strategies.  He also has end stage knee OA bilaterally but more pronounced on the right. This is both audible and patient c/o pain.  Patient may benefit from continuing skilled PT to progress toward goals below, but will need to understand that his condition is a management situation and that to get more permanent relief, he would likely need THA and possibly right TKA.  Suggested he follow up with Dr. Melodi if he feels that he is not getting the relief he desires with PT.    Eval: Patient is a 78 y.o. male who was seen today for physical therapy evaluation and treatment for left hip pain. Geno presents to skilled therapy with chronic left hip pain that recently flared up 3-4 months ago. He has pain instantly in the mornings when he gets up and it usually goes away with movement, but recently he has not gotten any relief. He walks 5x a week for 45 mins with a walking group. He enjoys gardening and doing yard work but his left hip gives him some pain but he pushes through it. His sleep is greatly disturbed by his left hip pain. Based on evaluation noted muscle weakness, decreased ROM, poor posture, and decreased flexibility. Patient is motivated and wants to be educated on how to manage pain. Patient will benefit from skilled PT  to address the below impairments and improve overall function.   OBJECTIVE IMPAIRMENTS: Abnormal gait, decreased balance, decreased coordination, decreased mobility, difficulty walking, decreased ROM, decreased strength, hypomobility, impaired perceived functional ability, increased muscle spasms, impaired flexibility, impaired sensation, improper body mechanics, postural dysfunction, and pain.   ACTIVITY LIMITATIONS: bending, sitting, standing, squatting, sleeping, stairs, dressing, hygiene/grooming, and locomotion level  PARTICIPATION LIMITATIONS: meal prep, cleaning, laundry, interpersonal relationship, community activity, and yard work  PERSONAL FACTORS: Age and 1-2 comorbidities: OA; peripheral neuropathy  are also affecting patient's functional outcome.   REHAB POTENTIAL: Good  CLINICAL DECISION MAKING: Evolving/moderate complexity  EVALUATION COMPLEXITY: Moderate   GOALS: Goals reviewed with  patient? Yes  SHORT TERM GOALS: Target date: 01/28/2024  Patient will be independent with initial HEP. Baseline:  Goal status: MET 01/13/24  2.  Patient will report > or = to 30% improvement in sleep quality since starting PT. Baseline:  Goal status: INITIAL  3.  Patient will be able to perform 5STS test with no losses of balance. Baseline: anterior LOB Goal status: MET 01/29/24  4.  Patient will be able to participate in  to establish baseline for functional test.  Baseline:  Goal status: MET 01/13/24   LONG TERM GOALS: Target date: 02/25/2024  Patient will demonstrate independence in advanced HEP. Baseline:  Goal status: In progress  2.  Patient will report > or = to 50% improvement in left hip pain since starting PT. Baseline:  Goal status: In Progress  3.  Patient will be able to garden with < or = to 3/10 left hip pain. Baseline:  Goal status: In Progress  4.  Patient will demonstrate at least 4+/5 strength on left for improved performance of sit to stand and  community negotiation. Baseline:  Goal status: In Progress  5.  Patient will score > or = to 66/80 on LEFS due to improved function and decreased disability. Baseline: 56/80  Goal status: In progress    PLAN:  PT FREQUENCY: 2x/week  PT DURATION: 8 weeks  PLANNED INTERVENTIONS: 97164- PT Re-evaluation, 97110-Therapeutic exercises, 97530- Therapeutic activity, 97112- Neuromuscular re-education, 97535- Self Care, 02859- Manual therapy, [id number]- Gait training, [phone]- Canalith repositioning, [id number]- Aquatic Therapy, [id number]- Electrical stimulation (unattended), [phone]- Electrical stimulation (manual), [id number]- Vasopneumatic device, [id number]- Ultrasound, [id number]- Traction (mechanical), [id number]- Ionotophoresis 4mg /ml Dexamethasone, 20560 (1-2 muscles), 20561 (3+ muscles)- Dry Needling, Patient/Family education, Balance training, Stair training, Taping, Joint mobilization, Joint manipulation, Spinal manipulation, Spinal mobilization, Vestibular training, Cryotherapy, and Moist heat  PLAN FOR NEXT SESSION:  NuStep, hip flexibility; postural strengthening; hip strengthening, gait mechanics   Nazariah Cadet B. Kasara Schomer, PT 02/03/24 5:14 PM [facility] [address] [place], [place] [address] Phone # [phone] Fax [phone]

## 2024-02-05 ENCOUNTER — Ambulatory Visit

## 2024-02-05 DIAGNOSIS — M6281 Muscle weakness (generalized): Secondary | ICD-10-CM

## 2024-02-05 DIAGNOSIS — R252 Cramp and spasm: Secondary | ICD-10-CM | POA: Diagnosis not present

## 2024-02-05 DIAGNOSIS — R293 Abnormal posture: Secondary | ICD-10-CM

## 2024-02-05 DIAGNOSIS — M25552 Pain in left hip: Secondary | ICD-10-CM

## 2024-02-05 DIAGNOSIS — R262 Difficulty in walking, not elsewhere classified: Secondary | ICD-10-CM

## 2024-02-05 NOTE — Therapy (Unsigned)
 OUTPATIENT PHYSICAL THERAPY LOWER EXTREMITY TREATMENT NOTE   [name] MRN: [id number] DOB:07/05/1945, 78 y.o., male Today's Date: 02/06/2024  END OF SESSION:  PT End of Session - 02/05/24 1519     Visit Number 10    Number of Visits 16    Date for Recertification  02/25/24    Authorization Type [employer] Approved 16 vls 12/31/23-02/26/24    Authorization - Visit Number 10    Authorization - Number of Visits 16    Progress Note Due on Visit 20    PT Start Time 1445    PT Stop Time 1530    PT Time Calculation (min) 45 min    Activity Tolerance Patient tolerated treatment well    Behavior During Therapy WFL for tasks assessed/performed            Past Medical History:  Diagnosis Date   Abnormality of gait    Arthritis    Atrial fibrillation (HCC)    BPH (benign prostatic hyperplasia)    Bradycardia    Cardiomyopathy (HCC)    Cervical disc disorder    Chicken pox    Closed fracture of one or more phalanges of foot    Clotting disorder    low platelets   Combined systolic and diastolic cardiac dysfunction    DDD (degenerative disc disease), cervical    Depression    Elevated PSA    Fracture of foot bone, right, closed 1964   GERD (gastroesophageal reflux disease)    Hammer toe    Hayfever    Hearing loss    Heart murmur    Hyperlipidemia    Neoplasm of uncertain behavior of skin    Pilonidal cyst with abscess    Polyneuropathy    Rheumatic fever    Seasonal allergies    Talipes cavus    Tinnitus    Past Surgical History:  Procedure Laterality Date   COLONOSCOPY     2006 normal exam   INGUINAL HERNIA REPAIR     PILONIDAL CYST EXCISION  1966   WISDOM TOOTH EXTRACTION     Patient Active Problem List   Diagnosis Date Noted   Malignant neoplasm of prostate (HCC) 04/19/2022   Bilateral carpal tunnel syndrome 08/18/2020   Midline low back pain without sciatica 09/30/2019   Atrophy of muscle of right lower leg 09/13/2019   Lumbar  radiculopathy 09/13/2019   Peripheral neuropathy 09/13/2019   Gait abnormality 08/19/2019   Paresthesia 08/19/2019   Neck pain 08/19/2019   Memory loss 08/19/2019   Chronic diastolic heart failure (HCC) 03/21/2016   Murmur, cardiac 01/15/2016   Dyspnea 01/15/2016   Abnormal ECG 01/15/2016   Hypertriglyceridemia 01/15/2016    PCP: Merna Huxley, NP  REFERRING PROVIDER: Merna Huxley, NP  REFERRING DIAG: [phone] (ICD-10-CM) - Left leg pain   THERAPY DIAG:  Pain in left hip  Difficulty in walking, not elsewhere classified  Muscle weakness (generalized)  Cramp and spasm  Abnormal posture  Rationale for Evaluation and Treatment: Rehabilitation  ONSET DATE: 3-4 months ago  SUBJECTIVE:   SUBJECTIVE STATEMENT: Patient reports he continues to feel left lateral hip pain. (Refers to bursa)   Eval: Patient presents with left hip pain that is usually worse in the mornings. He went to Dr. 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 He reports Dr. Hiram said he does not need a hip replacement now, but he will in the future. He went on two trips to [place]  and the required a lot of sitting. He struggled to sit in the very low chairs and he would like to be able to do that. He walks 5x a week with a group for 45 mins.   PERTINENT HISTORY: Peripheral neuropathy ; OA; Depression; DDD; Hx low back pain PAIN:  01/29/24 Are you having pain? Yes: NPRS scale: 2-3 Pain location: front and side of his left quad Pain description: achy Aggravating factors: getting out of bed; walking;  Relieving factors: stretching   PRECAUTIONS: None  RED FLAGS: None   WEIGHT BEARING RESTRICTIONS: No  FALLS:  Has patient fallen in last 6 months? No  LIVING ENVIRONMENT: Lives with: lives alone Lives in: House/apartment Stairs: No Has following equipment at home: He has a cane, walker, shower chair, but he does not use anything  OCCUPATION: Retired   PLOF: Independent,  Independent with basic ADLs, Independent with household mobility without device, Independent with community mobility without device, Independent with gait, Independent with transfers, and Leisure: Working in the yard, garden , working on cars  PATIENT GOALS: To get rid of pain  NEXT MD VISIT: PRN  OBJECTIVE:  Note: Objective measures were completed at Evaluation unless otherwise noted.  DIAGNOSTIC FINDINGS: Patient reports Lt hip arthritis (Dr. Hiram)   PATIENT SURVEYS:  LEFS: 56/80 70%   COGNITION: Overall cognitive status: Within functional limits for tasks assessed     SENSATION: Patient had peripheral neuropathy   MUSCLE LENGTH: Hamstrings: decreased bilateral Lt > Rt    POSTURE: rounded shoulders, forward head, increased thoracic kyphosis, and weight shift right  PALPATION: Tenderness around Lt hip joint. Patient has a deep achy pain in left hip pain.  LOWER EXTREMITY ROM: Left hip PROM limited in all directions  Lt >Rt  ; patient unable to achieve FABER position on left. Patient has pain at end ranges.  LOWER EXTREMITY MMT: Grossly 4+/5 on right; 4 to 4+/5 on Lt some pain with left hip abduction testing;   FUNCTIONAL TESTS:  5 times sit to stand: 9.18 sec no UE support;  uncontrolled descend; anterior LOB requiring stepping strategy   Timed up and go (TUG): 5.00 sec     : 1757.2 feet GAIT:  Comments: Right lateral trunk lean; antalgic gait                                                                                                                               TREATMENT DATE:  02/05/24 Nustep x 6 min level 6 increased to level 7   Seated piriformis stretch 3 x 30 sec Taught supine piriformis stretch (patient had multiple questions about specific muscles we are stretching) Supine combo hip IR/ER stretch x 5 each side holding 10 sec each Hip IT band and adductor stretch 2 x 30 each LE Again today, we spend a fair amount of time educating Teal to clarify  his understanding the why for each exercise we do as he has a  misunderstanding of the correct way to exercise.   Ice to left lateral hip in side lying x 10 min at end of session   02/03/24 Nustep x 6 min level 6 increased to level 7  and then to level 8 during the 6 min time frame (had discussion about high output vs quality exercise) Standing hamstring stretch 2 x 30 each LE [place] stretch 3 x 30 each LE Supine combo hip IR/ER stretch x 5 each side holding 10 sec each Hip IT band and adductor stretch 2 x 30 each LE We spend a fair amount of time educating Romar to clarify his understanding the why for each exercise we do as he has a misunderstanding of the correct way to exercise.   Ice to left lateral hip in side lying x 10 min at end of session   01/29/24 Nustep x 6 min level 6 increased to level 7 at approx 5 min Seated piriformis stretch 3 x 30 HEAVY education on self directing and trying to seek guidance from the internet.   Reminders re: changes in the body with age including tissue changes, natural fusions and overall rigidity.  Educated patient on proper care for spine and joints to reduce progression of degenerative changes.   Lengthy discussion about whether he needs skilled care as he tends to self direct over most instructions given by PT.   Patient instructed that visit was complete but he wanted to stay and do more stretching since we didn't get to our usual stretches.  Explained to patient that we typically do not do this because he should be supervised while doing this but that since I would be in the room and have visual on him, he could do the stretches that we have previously instructed him on and that PT has observed independence with.  Explained that other patients would be coming in so we would only allow a few more minutes.  Patient was observed doing hamstring stretches and hip flexor/quad stretches properly and then he left.    01/27/24 Nustep x 6 min level 6  increased to level 7 at approx 5 min Standing hamstring stretch 3 x 30 each LE Standing quad/hip flexor stretch 3 x 30  Seated piriformis stretch 3 x 30  HEAVY education on avoiding overly aggressive stretching as this creates pain reflex and muscle guarding/shortening vs elongating Educated on changes in the body with age including tissue changes, natural fusions and overall rigidity.  Educated patient on proper care for spine and joints to reduce progression of degenerative changes.   Supine IT band stretch 3 x 30 seconds both LE's, patient continued to need vc's on avoiding overly aggressive stretching.  Left hip IT band rolling x 3-4 min Left hip IT band and glut STM with theragun x 3-4 min Butterfly stretch in supine x 5 Patient instructed that visit was complete but he was welcome to rest before leaving.  He remained for an extended period of time and had questions on specific stretching for anterior hip.   12/31/2023 Initial Evaluation & HEP created     PATIENT EDUCATION:  Education details: PT eval findings, anticipated POC, progress with PT, and initial HEP Person educated: Patient Education method: Explanation, Demonstration, and Handouts Education comprehension: verbalized understanding, returned demonstration, and needs further education  HOME EXERCISE PROGRAM: Access Code: [email or web address] URL: [email or web address] Date: 01/15/2024 Prepared by: Orvil Beuhring  Exercises - Supine Hip Internal and External Rotation  - 1 x daily - 7 x weekly -  1 sets - 10 reps - 5 hold - Seated Hamstring Stretch  - 1 x daily - 7 x weekly - 2 sets - 10 reps - 20-30s hold - Quadricep Stretch with Chair and Counter Support  - 1 x daily - 7 x weekly - 2 sets - 20-30 hold - Supine ITB Stretch with Strap  - 1 x daily - 7 x weekly - 2 sets - 20-30 hold - Supine Hamstring Stretch with Strap  - 1 x daily - 7 x weekly - 2 sets - 20-30s hold - Supine Bridge  - 1 x daily - 7 x weekly - 2  sets - 5 reps - 10 hold - Standard Lunge  - 1 x daily - 7 x weekly - 1 sets - 10 reps - Lateral Lunge  - 1 x daily - 7 x weekly - 1 sets - 10 reps - Side Stepping with Resistance at Feet  - 1 x daily - 7 x weekly - 3 sets - 10 reps  ASSESSMENT:  CLINICAL IMPRESSION: Mubashir did well with stretches but continues to need a lot of questions and enjoys learning about anatomy.  However, we spend a fair amount of time going over anatomy and naming muscles vs having time to do more in the way of exercise.  We continue to explain to patient that he will likely continue to have issues and that what we have taught him is how to manage his hip pain until he is ready to discuss surgery with ortho provider.  Encouraged patient to have f/u with ortho but patient states he does not want surgery.    Patient will likely not gain the relief he is seeking without having a hip replacement.   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  He has been more compliant with getting up to stretch and move around every 30 min.  He is very well motivated but may be meeting a plateau.    Eval: Patient is a 78 y.o. male who was seen today for physical therapy evaluation and treatment for left hip pain. Lena presents to skilled therapy with chronic left hip pain that recently flared up 3-4 months ago. He has pain instantly in the mornings when he gets up and it usually goes away with movement, but recently he has not gotten any relief. He walks 5x a week for 45 mins with a walking group. He enjoys gardening and doing yard work but his left hip gives him some pain but he pushes through it. His sleep is greatly disturbed by his left hip pain. Based on evaluation noted muscle weakness, decreased ROM, poor posture, and decreased flexibility. Patient is motivated and wants to be educated on how to manage pain. Patient will benefit from skilled PT to  address the below impairments and improve overall function.   OBJECTIVE IMPAIRMENTS: Abnormal gait, decreased balance, decreased coordination, decreased mobility, difficulty walking, decreased ROM, decreased strength, hypomobility, impaired perceived functional ability, increased muscle spasms, impaired flexibility, impaired sensation, improper body mechanics, postural dysfunction, and pain.   ACTIVITY LIMITATIONS: bending, sitting, standing, squatting, sleeping, stairs, dressing, hygiene/grooming, and locomotion level  PARTICIPATION LIMITATIONS: meal prep, cleaning, laundry, interpersonal relationship, community activity, and yard work  PERSONAL FACTORS: Age and 1-2 comorbidities: OA; peripheral neuropathy  are also affecting patient's functional outcome.   REHAB POTENTIAL: Good  CLINICAL DECISION  MAKING: Evolving/moderate complexity  EVALUATION COMPLEXITY: Moderate   GOALS: Goals reviewed with patient? Yes  SHORT TERM GOALS: Target date: 01/28/2024  Patient will be independent with initial HEP. Baseline:  Goal status: MET 01/13/24  2.  Patient will report > or = to 30% improvement in sleep quality since starting PT. Baseline:  Goal status: INITIAL  3.  Patient will be able to perform 5STS test with no losses of balance. Baseline: anterior LOB Goal status: MET 01/29/24  4.  Patient will be able to participate in  to establish baseline for functional test.  Baseline:  Goal status: MET 01/13/24   LONG TERM GOALS: Target date: 02/25/2024  Patient will demonstrate independence in advanced HEP. Baseline:  Goal status: In progress  2.  Patient will report > or = to 50% improvement in left hip pain since starting PT. Baseline:  Goal status: In Progress  3.  Patient will be able to garden with < or = to 3/10 left hip pain. Baseline:  Goal status: In Progress  4.  Patient will demonstrate at least 4+/5 strength on left for improved performance of sit to stand and  community negotiation. Baseline:  Goal status: In Progress  5.  Patient will score > or = to 66/80 on LEFS due to improved function and decreased disability. Baseline: 56/80  Goal status: In progress    PLAN:  PT FREQUENCY: 2x/week  PT DURATION: 8 weeks  PLANNED INTERVENTIONS: 97164- PT Re-evaluation, 97110-Therapeutic exercises, 97530- Therapeutic activity, [id number]- Neuromuscular re-education, 97535- Self Care, [id number]- Manual therapy, [id number]- Gait training, [phone]- Canalith repositioning, [id number]- Aquatic Therapy, [phone]- Electrical stimulation (unattended), [phone]- Electrical stimulation (manual), [id number]- Vasopneumatic device, [id number]- Ultrasound, [id number]- Traction (mechanical), [id number]- Ionotophoresis 4mg /ml Dexamethasone, [id number] (1-2 muscles), [id number] (3+ muscles)- Dry Needling, Patient/Family education, Balance training, Stair training, Taping, Joint mobilization, Joint manipulation, Spinal manipulation, Spinal mobilization, Vestibular training, Cryotherapy, and Moist heat  PLAN FOR NEXT SESSION:  NuStep, hip flexibility; postural strengthening; hip strengthening, gait mechanics   Tonjua Rossetti B. Deneice Wack, PT 02/06/24 7:37 AM [facility] [address], [address] [place], [place] [address] Phone # [phone] Fax [phone]

## 2024-02-09 ENCOUNTER — Ambulatory Visit: Attending: Cardiovascular Disease | Admitting: Cardiovascular Disease

## 2024-02-09 ENCOUNTER — Encounter: Payer: Self-pay | Admitting: Cardiovascular Disease

## 2024-02-09 ENCOUNTER — Encounter: Payer: Self-pay | Admitting: Radiology

## 2024-02-09 VITALS — BP 124/62 | HR 63 | Ht 71.0 in | Wt 187.2 lb

## 2024-02-09 DIAGNOSIS — E781 Pure hyperglyceridemia: Secondary | ICD-10-CM | POA: Diagnosis not present

## 2024-02-09 DIAGNOSIS — I67850 Cerebral autosomal dominant arteriopathy with subcortical infarcts and leukoencephalopathy: Secondary | ICD-10-CM | POA: Diagnosis not present

## 2024-02-09 DIAGNOSIS — R011 Cardiac murmur, unspecified: Secondary | ICD-10-CM

## 2024-02-09 DIAGNOSIS — I422 Other hypertrophic cardiomyopathy: Secondary | ICD-10-CM

## 2024-02-09 DIAGNOSIS — I493 Ventricular premature depolarization: Secondary | ICD-10-CM

## 2024-02-09 NOTE — Patient Instructions (Signed)

## 2024-02-09 NOTE — Progress Notes (Unsigned)
 Cardiology Office Note    Date:  02/13/2024   ID:  TOMER CHALMERS, DOB [birth year], MRN [id number]  PCP:  Merna Huxley, NP  Cardiologist:   Jerel Balding, MD   Chief complaint: Follow-up cardiomyopathy   History of Present Illness:  Marc Thornton is a 78 y.o. male with asymptomatic apical hypertrophic cardiomyopathy, to date without any clinically relevant episodes of arrhythmia or congestive heart failure.  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  He is confident that this is the cause for his father's dementia concerned about its impact via microvascular disease and other organs such as his heart.  He is generally feeling quite well.  He has not had problems with shortness of breath or chest pain at rest or with activity.  He has not had palpitations, dizziness or syncope.  Denies lower extremity edema or claudication.  He had a very rewarding trip to [place] with 2 of his adult sons.  He plans to go back for another trip next year.  PET scan showed evidence of microvascular dysfunction with a myocardial blood flow reserve that was only 1.47 with evidence of ischemia in the apex, but cardiac CTA did not show any evidence of epicardial coronary disease.  Coronary calcium score was only 133 which for his age places him in the 32nd percentile.  Cardiac MRI shows a roughly 14% burden of scar by late gadolinium enhancement mostly located in the apical subendocardial area.  There is evidence of apical displacement of his papillary muscles.  He does not have left ventricular outflow tract obstruction.  There is no defined wall motion abnormality on echocardiogram, where he has hyperdynamic left ventricular systolic function.  Similar findings are present on his echocardiogram from 2020 although the diagnosis of apical variant hypertrophic cardiomyopathy was not defined on that report.  An event monitor in 2020 showed frequent PVCs and occasional very brief  episodes of paroxysmal atrial tachycardia.  He did not have sustained or nonsustained VT or atrial fibrillation or significant bradycardia.  PYP scan did not show evidence of cardiac amyloidosis.  Nuclear stress testing in 2017 showed a hypertensive response to exercise but no perfusion abnormalities.  He does not have a personal history of arrhythmia or syncope or family history of sudden cardiac death or ventricular tachycardia.    He has a family history of neurological decline at advanced ages (62s) which has led to the demise of his father and paternal uncles with a fairly predictable pattern of dementia culminating in a stroke.  This is due to a familial pattern of small vessel disease.  His paternal grandfather had intracranial hemorrhage in his early 55s.  His paternal aunt and uncle had vascular dementia.  His father had coronary artery disease, bypass surgery at age 49 congestive heart failure and took diuretics for about 20 years, had a stroke at age 74 and died at age 3.  He is concerned that he has noticed subtle reductions in his frontal lobe functions,  such as complex task strategizing and difficulty finding words.  His MRI does confirm some white matter abnormalities on T2 flair imaging suggestive of microvascular ischemia.  He has been seeing Dr. Onita in the neurology clinic.  He spent a year and a half in [place] in the [employer] and had some exposure to [employer]. He quit smoking 50 years ago.     Past Medical History:  Diagnosis Date   Abnormality of gait  Arthritis    Atrial fibrillation (HCC)    BPH (benign prostatic hyperplasia)    Bradycardia    Cardiomyopathy (HCC)    Cervical disc disorder    Chicken pox    Closed fracture of one or more phalanges of foot    Clotting disorder    low platelets   Combined systolic and diastolic cardiac dysfunction    DDD (degenerative disc disease), cervical    Depression    Elevated PSA    Fracture of foot bone, right, closed  1964   GERD (gastroesophageal reflux disease)    Hammer toe    Hayfever    Hearing loss    Heart murmur    Hyperlipidemia    Neoplasm of uncertain behavior of skin    Pilonidal cyst with abscess    Polyneuropathy    Rheumatic fever    Seasonal allergies    Talipes cavus    Tinnitus     Past Surgical History:  Procedure Laterality Date   COLONOSCOPY     2006 normal exam   INGUINAL HERNIA REPAIR     PILONIDAL CYST EXCISION  1966   WISDOM TOOTH EXTRACTION      Current Medications: Outpatient Medications Prior to Visit  Medication Sig Dispense Refill   Betaine, Trimethylglycine, (TMG, TRIMETHYLGLYCINE, PO) Take by mouth. Taking 2 capsules (Patient taking differently: Take 2 capsules by mouth as needed. Taking 2 capsules)     cholecalciferol (VITAMIN D3) 25 MCG (1000 UNIT) tablet Take 1,000 Units by mouth daily. (Patient taking differently: Take 2,000 Units by mouth daily.)     ibuprofen (ADVIL) 200 MG tablet Take 200 mg by mouth as needed.     ketoconazole (NIZORAL) 2 % shampoo Apply 1 application topically as directed. PRN     silver sulfADIAZINE (SILVADENE) 1 % cream Apply topically. (Patient taking differently: Apply 1 Application topically as needed.)     acetaminophen (TYLENOL) 325 MG tablet Take by mouth as needed. (Patient not taking: Reported on 02/09/2024)     Naproxen Sodium (ALEVE PO) Take by mouth as needed. (Patient not taking: Reported on 02/09/2024)     triamcinolone  cream (KENALOG) 0.1 % Apply 1 application topically daily as needed. (Patient not taking: Reported on 02/09/2024)     No facility-administered medications prior to visit.     Allergies:   Metoprolol  and Oxybutynin chloride   Social History   Socioeconomic History   Marital status: Widowed    Spouse name: Not on file   Number of children: Not on file   Years of education: Not on file   Highest education level: Professional school degree (e.g., MD, DDS, DVM, JD)  Occupational History   Occupation:  Retired  Tobacco Use   Smoking status: Former    Current packs/day: 0.00    Average packs/day: 2.0 packs/day for 9.0 years (18.0 ttl pk-yrs)    Types: Cigarettes    Start date: 48    Quit date: 1969    Years since quitting: 56.8   Smokeless tobacco: Former  Building Services Engineer status: Never Used  Substance and Sexual Activity   Alcohol use: Yes    Comment: on occasion, maybe 3-4 times per month   Drug use: No   Sexual activity: Not on file  Other Topics Concern   Not on file  Social History Narrative   Lives alone   Right handed   Caffeine: 1-2 cups tea/day and 1 cup coffee some  days   Social Drivers of  Health   Financial Resource Strain: Low Risk  (12/29/2023)   Overall Financial Resource Strain (CARDIA)    Difficulty of Paying Living Expenses: Not hard at all  Food Insecurity: No Food Insecurity (12/29/2023)   Hunger Vital Sign    Worried About Running Out of Food in the Last Year: Never true    Ran Out of Food in the Last Year: Never true  Transportation Needs: No Transportation Needs (12/29/2023)   PRAPARE - Administrator, Civil Service (Medical): No    Lack of Transportation (Non-Medical): No  Physical Activity: Sufficiently Active (12/29/2023)   Exercise Vital Sign    Days of Exercise per Week: 5 days    Minutes of Exercise per Session: 30 min  Stress: No Stress Concern Present (12/29/2023)   [employer] of Occupational Health - Occupational Stress Questionnaire    Feeling of Stress: Only a little  Social Connections: Socially Isolated (12/29/2023)   Social Connection and Isolation Panel    Frequency of Communication with Friends and Family: More than three times a week    Frequency of Social Gatherings with Friends and Family: More than three times a week    Attends Religious Services: Never    Database Administrator or Organizations: No    Attends Banker Meetings: Not on file    Marital Status: Widowed     Family History:  The  patient's family history includes Arthritis in his father and mother; Breast cancer in his mother; Dementia in his father, paternal aunt, and paternal uncle; Heart disease in his father; Hypertension in his father; Prostate cancer in his father; Stroke in his father, maternal grandmother, and paternal grandfather.   ROS:   Please see the history of present illness.    ROS All other systems reviewed and are negative.   PHYSICAL EXAM:   VS:  BP 124/62 (BP Location: Left Arm, Patient Position: Sitting)   Pulse 63   Ht 5' 11 (1.803 m)   Wt 187 lb 3.2 oz (84.9 kg)   SpO2 98%   BMI 26.11 kg/m       General: Alert, oriented x3, no distress, appears younger than stated age. Head: no evidence of trauma, PERRL, EOMI, no exophtalmos or lid lag, no myxedema, no xanthelasma; normal ears, nose and oropharynx Neck: normal jugular venous pulsations and no hepatojugular reflux; brisk carotid pulses without delay and no carotid bruits Chest: clear to auscultation, no signs of consolidation by percussion or palpation, normal fremitus, symmetrical and full respiratory excursions Cardiovascular: normal position and quality of the apical impulse, regular rhythm, normal first and second heart sounds, no murmurs, rubs or gallops.  Systolic murmur cannot be elicited today Abdomen: no tenderness or distention, no masses by palpation, no abnormal pulsatility or arterial bruits, normal bowel sounds, no hepatosplenomegaly Extremities: no clubbing, cyanosis or edema; 2+ radial, ulnar and brachial pulses bilaterally; 2+ right femoral, posterior tibial and dorsalis pedis pulses; 2+ left femoral, posterior tibial and dorsalis pedis pulses; no subclavian or femoral bruits Neurological: grossly nonfocal Psych: Normal mood and affect    Wt Readings from Last 3 Encounters:  02/09/24 187 lb 3.2 oz (84.9 kg)  12/31/23 183 lb (83 kg)  06/11/23 193 lb (87.5 kg)      Studies/Labs Reviewed:   PET scan 07/31/2022     Reversible perfusion defect in all apical segments, consistent with ischemia.  Patient has known apical HCM, and apical ischemia is is commonly seen.  However, TID is  also present (1.19) and myocardial blood flow reserve is reduced both globally and in each coronary distribution, which could be due to either multivessel obstructive CAD or microvascular dysfunction.  Given only mild coronary calcifications on CT, suspect findings represent microvascular dysfunction, but recommend either cath or coronary CTA to rule out obstructive CAD   LV perfusion is abnormal. There is evidence of ischemia. Defect 1: There is a medium defect with mild reduction in uptake present in the apical anterior, inferior, lateral, septal and apex location(s) that is reversible. There is normal wall motion in the defect area. Consistent with ischemia.   Rest left ventricular function is normal. Rest EF: 50 %. Stress left ventricular function is normal. Stress EF: 52 %. End diastolic cavity size is normal. End systolic cavity size is normal.   Myocardial blood flow was computed to be 0.6ml/g/min at rest and 1.25ml/g/min at stress. Global myocardial blood flow reserve was 1.47 and was abnormal.   Coronary calcium was present on the attenuation correction CT images. Mild coronary calcifications were present. Coronary calcifications were present in the left anterior descending artery distribution(s).   Findings are consistent with ischemia. The study is high risk.   Electronically signed by Venetia Nanas, MD  Coronary CT 08/20/2022:  1. Coronary calcium score of 133. This was 32nd percentile for age, sex, and race matched control. 2. Normal coronary origin with right dominance. 3. CAD-RADS 1. Minimal non-obstructive CAD (1-24%). Consider non-atherosclerotic causes of chest pain. Consider preventive therapy and risk factor modification.  cMRI 10/24/2020: 1. Findings consistent with apical variant  hypertrophic cardiomyopathy. ECG with marked T wave abnormalities, disproportionate apical thickening 15 mm with displaced hypertrophied papillary muscles, Systolic cavity obliteration, and hyper enhancement post gadolinium involving 14.4% of the total myocardium   2.  Normal LV function quantitative EF 69%   3.  Normal RV size and function   4.  Normal cardiac valves   5.  Normal aortic root 3.7 cm  ECHO 12/10/2018:  1. The left ventricle has hyperdynamic systolic function, with an ejection fraction of >65%. The cavity size was normal. Left ventricular diastolic Doppler parameters are consistent with impaired relaxation. Indeterminate filling pressures The E/e' is  8-15. No evidence of left ventricular regional wall motion abnormalities.  2. The right ventricle has normal systolic function. The cavity was normal. There is no increase in right ventricular wall thickness.  3. The mitral valve is grossly normal.  4. The aortic valve was not well visualized. No stenosis of the aortic valve.  5. The aorta is normal unless otherwise noted.  6. The interatrial septum was not well visualized.  On my review of the above echo images, I think he probably has apical variant hypertrophic cardiomyopathy.  There was no evidence of systolic anterior motion of the mitral valve or any left ventricular outflow tract obstruction at rest, but provocative maneuvers were not performed.  Event monitor 01/28/2019: Abnormal event monitor due to the presence of frequent PVCs and occasional brief runs of nonsustained atrial tachycardia.   There is no evidence of significant bradycardia, ventricular tachycardia or atrial fibrillation.  EKG:   EKG Interpretation Date/Time:  Monday February 09 2024 13:53:03 EST Ventricular Rate:  63 PR Interval:  170 QRS Duration:  118 QT Interval:  394 QTC Calculation: 403 R Axis:   -14  Text Interpretation: Normal sinus rhythm Non-specific intra-ventricular conduction  delay Left ventricular hypertrophy with QRS widening ( R in aVL , Cornell product ) Cannot rule out Septal infarct (  cited on or before 05-Feb-2023) ST & Marked T wave abnormality, consider anterolateral ischemia When compared with ECG of 05-Feb-2023 11:22, No significant change since last tracing Confirmed by Jionni Helming [phone]) on 02/09/2024 2:08:11 PM        LABS: [facility] 10/20/2015 Cholesterol 178, triglycerides 303, HDL 43, LDL 74, hemoglobin A1c 5.4%  07/07/2019 Total cholesterol 167, HDL 38, LDL 94, triglycerides 175 Hemoglobin A1c 5.5% Hemoglobin 15.4, creatinine 0.95, potassium 3.9, TSH 2.87  03/01/2020 Cholesterol 172, HDL 41, LDL 108, triglycerides 120 Hemoglobin A1c 5.3% Hemoglobin 14.7, creatinine 0.95, potassium 4.0, TSH 2.51, ALT 17  03/06/2021 Cholesterol 173, HDL 46.6, LDL 103, triglycerides 120 Hemoglobin A1c 5.4% Hemoglobin 15.7, creatinine 1.08, potassium 4.5, ALT 22, TSH 2.11  03/07/2022 Cholesterol 159, HDL 46.8, LDL 96, triglycerides 79 Hemoglobin 15.4, ALT 19  08/19/2022 Creatinine 1.07, potassium 4.9, hemoglobin A1c 5.4%  08/10/2022 Cholesterol 149, HDL 38, triglycerides 108 Creatinine 1.0, potassium 4.0, ALT 16, TSH 1.550  ASSESSMENT:    1. Hypertrophic cardiomyopathy (HCC)   2. PVC (premature ventricular contraction)   3. Hypertriglyceridemia   4. CADASIL      PLAN:  In order of problems listed above:   CHF: Excellent functional status, NYHA class I, euvolemic without loop diuretics.  No pharmacological therapy is recommended.  Although he does not describe any limitations in his exercise ability doing activities of daily living, he did have somewhat reduced exercise tolerance on a standardized Bruce protocol treadmill test.  He has echo evidence of diastolic dysfunction, but has not had overt severe heart failure.   Apical variant hypertrophic cardiomyopathy: At his last appointment he had a dynamic systolic murmur but this is not  audible today.  It happened while he was taking oxybutynin and resolved when he was taken off of it.  He has findings consistent with apical HCM, has never had detectable LV outflow tract obstruction.  Late gadolinium enhancement demonstrates a relatively small left ventricular scar since 14%), limited to the apex.  He has not had ventricular tachycardia detected to date.  Prognosis is likely to be very good.  He is not on beta-blockers.  HyperTG: Resolved with diet and lifestyle changes.  All his lipid parameters is good without pharmacological therapy, CADASIL Cerebral small vessel disease: I think he is doing a great job addressing his risk factors with an excellent lipid profile, normal glucose metabolism, good resting blood pressure, healthy diet, moderate regular exercise.  No specific treatment is available for his genetic disorder.  Medication Adjustments/Labs and Tests Ordered: Current medicines are reviewed at length with the patient today.  Concerns regarding medicines are outlined above.  Medication changes, Labs and Tests ordered today are listed in the Patient Instructions below. Patient Instructions  Medication Instructions:  No changes *If you need a refill on your cardiac medications before your next appointment, please call your pharmacy*  Lab Work: None ordered If you have labs (blood work) drawn today and your tests are completely normal, you will receive your results only by: MyChart Message (if you have MyChart) OR A paper copy in the mail If you have any lab test that is abnormal or we need to change your treatment, we will call you to review the results.  Testing/Procedures: None ordered  Follow-Up: At [facility], you and your health needs are our priority.  As part of our continuing mission to provide you with exceptional heart care, our providers are all part of one team.  This team includes your primary Cardiologist (  physician) and Advanced Practice  Providers or APPs (Physician Assistants and Nurse Practitioners) who all work together to provide you with the care you need, when you need it.  Your next appointment:   1 year(s)  Provider:   Jerel Balding, MD    We recommend signing up for the patient portal called MyChart.  Sign up information is provided on this After Visit Summary.  MyChart is used to connect with patients for Virtual Visits (Telemedicine).  Patients are able to view lab/test results, encounter notes, upcoming appointments, etc.  Non-urgent messages can be sent to your provider as well.   To learn more about what you can do with MyChart, go to [email or web address].      Signed, Jerel Balding, MD  02/13/2024 2:11 PM    [facility] [address] [place], [place], [place]  [address] Phone: [phone]; Fax: [phone]

## 2024-02-10 ENCOUNTER — Ambulatory Visit: Attending: Adult Health

## 2024-02-10 DIAGNOSIS — M6281 Muscle weakness (generalized): Secondary | ICD-10-CM | POA: Insufficient documentation

## 2024-02-10 DIAGNOSIS — M25552 Pain in left hip: Secondary | ICD-10-CM | POA: Insufficient documentation

## 2024-02-10 DIAGNOSIS — R252 Cramp and spasm: Secondary | ICD-10-CM | POA: Insufficient documentation

## 2024-02-10 DIAGNOSIS — R293 Abnormal posture: Secondary | ICD-10-CM | POA: Insufficient documentation

## 2024-02-10 DIAGNOSIS — R262 Difficulty in walking, not elsewhere classified: Secondary | ICD-10-CM | POA: Diagnosis not present

## 2024-02-10 NOTE — Therapy (Unsigned)
 OUTPATIENT PHYSICAL THERAPY LOWER EXTREMITY TREATMENT NOTE   [name] MRN: [id number] DOB:[birth year], 78 y.o., male Today's Date: 02/11/2024  END OF SESSION:  PT End of Session - 02/10/24 1453     Visit Number 11    Number of Visits 16    Date for Recertification  02/25/24    Authorization Type [employer] Approved 16 vls 12/31/23-02/26/24    Authorization - Visit Number 11    Authorization - Number of Visits 16    Progress Note Due on Visit 20    PT Start Time 1450    PT Stop Time 1539    PT Time Calculation (min) 49 min    Activity Tolerance Patient tolerated treatment well    Behavior During Therapy WFL for tasks assessed/performed            Past Medical History:  Diagnosis Date   Abnormality of gait    Arthritis    Atrial fibrillation (HCC)    BPH (benign prostatic hyperplasia)    Bradycardia    Cardiomyopathy (HCC)    Cervical disc disorder    Chicken pox    Closed fracture of one or more phalanges of foot    Clotting disorder    low platelets   Combined systolic and diastolic cardiac dysfunction    DDD (degenerative disc disease), cervical    Depression    Elevated PSA    Fracture of foot bone, right, closed 1964   GERD (gastroesophageal reflux disease)    Hammer toe    Hayfever    Hearing loss    Heart murmur    Hyperlipidemia    Neoplasm of uncertain behavior of skin    Pilonidal cyst with abscess    Polyneuropathy    Rheumatic fever    Seasonal allergies    Talipes cavus    Tinnitus    Past Surgical History:  Procedure Laterality Date   COLONOSCOPY     2006 normal exam   INGUINAL HERNIA REPAIR     PILONIDAL CYST EXCISION  1966   WISDOM TOOTH EXTRACTION     Patient Active Problem List   Diagnosis Date Noted   Malignant neoplasm of prostate (HCC) 04/19/2022   Bilateral carpal tunnel syndrome 08/18/2020   Midline low back pain without sciatica 09/30/2019   Atrophy of muscle of right lower leg 09/13/2019   Lumbar  radiculopathy 09/13/2019   Peripheral neuropathy 09/13/2019   Gait abnormality 08/19/2019   Paresthesia 08/19/2019   Neck pain 08/19/2019   Memory loss 08/19/2019   Chronic diastolic heart failure (HCC) 03/21/2016   Murmur, cardiac 01/15/2016   Dyspnea 01/15/2016   Abnormal ECG 01/15/2016   Hypertriglyceridemia 01/15/2016    PCP: Merna Huxley, NP  REFERRING PROVIDER: Merna Huxley, NP  REFERRING DIAG: [phone] (ICD-10-CM) - Left leg pain   THERAPY DIAG:  Pain in left hip  Abnormal posture  Cramp and spasm  Difficulty in walking, not elsewhere classified  Muscle weakness (generalized)  Rationale for Evaluation and Treatment: Rehabilitation  ONSET DATE: 3-4 months ago  SUBJECTIVE:   SUBJECTIVE STATEMENT: Patient reports he continues to feel left lateral hip pain. (Refers to bursa) My vastus lateralis is being bothersome   Eval: Patient presents with left hip pain that is usually worse in the mornings. He went to Dr. 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 He reports Dr. Hiram said he does not need a hip replacement now, but he will in the future. He  went on two trips to [place] and the required a lot of sitting. He struggled to sit in the very low chairs and he would like to be able to do that. He walks 5x a week with a group for 45 mins.   PERTINENT HISTORY: Peripheral neuropathy ; OA; Depression; DDD; Hx low back pain PAIN:  02/10/24 Are you having pain? Yes: NPRS scale: 2/10 Pain location: front and side of his left quad Pain description: achy Aggravating factors: getting out of bed; walking;  Relieving factors: stretching   PRECAUTIONS: None  RED FLAGS: None   WEIGHT BEARING RESTRICTIONS: No  FALLS:  Has patient fallen in last 6 months? No  LIVING ENVIRONMENT: Lives with: lives alone Lives in: House/apartment Stairs: No Has following equipment at home: He has a cane, walker, shower chair, but he does not use  anything  OCCUPATION: Retired   PLOF: Independent, Independent with basic ADLs, Independent with household mobility without device, Independent with community mobility without device, Independent with gait, Independent with transfers, and Leisure: Working in the yard, garden , working on cars  PATIENT GOALS: To get rid of pain  NEXT MD VISIT: PRN  OBJECTIVE:  Note: Objective measures were completed at Evaluation unless otherwise noted.  DIAGNOSTIC FINDINGS: Patient reports Lt hip arthritis (Dr. Hiram)   PATIENT SURVEYS:  LEFS: 56/80 70%   COGNITION: Overall cognitive status: Within functional limits for tasks assessed     SENSATION: Patient had peripheral neuropathy   MUSCLE LENGTH: Hamstrings: decreased bilateral Lt > Rt    POSTURE: rounded shoulders, forward head, increased thoracic kyphosis, and weight shift right  PALPATION: Tenderness around Lt hip joint. Patient has a deep achy pain in left hip pain.  LOWER EXTREMITY ROM: Left hip PROM limited in all directions  Lt >Rt  ; patient unable to achieve FABER position on left. Patient has pain at end ranges.  LOWER EXTREMITY MMT: Grossly 4+/5 on right; 4 to 4+/5 on Lt some pain with left hip abduction testing;   FUNCTIONAL TESTS:  5 times sit to stand: 9.18 sec no UE support;  uncontrolled descend; anterior LOB requiring stepping strategy   Timed up and go (TUG): 5.00 sec     : 1757.2 feet GAIT:  Comments: Right lateral trunk lean; antalgic gait                                                                                                                               TREATMENT DATE:  02/10/24 Nustep x 6 min level 6 increased to level 7  (Patient was being very aggressive on the bike and it was suggested that he slow down and reduce his resistance level.  Patient states well I am at a lower resistance (he was at the same resistance of 7 that he started out on) and goes on to explain that when you're biking,  you have to stay at a certain pace.  PT explains to patient  that this may be true but he is in a rehab state and that we are trying to get the bursa to calm down and that his aggressive approach is causing irritation.   Seated piriformis stretch 3 x 30 sec Supine combo hip IR/ER stretch x 5 each side holding 10 sec each Hip IT band and adductor stretch 2 x 30 each LE Side lying on right side with IT band rolling and manual STM Moist heat with premodulated estim to left lateral hip x 15 min  02/05/24 Nustep x 6 min level 6 increased to level 7   Seated piriformis stretch 3 x 30 sec Taught supine piriformis stretch (patient had multiple questions about specific muscles we are stretching) Supine combo hip IR/ER stretch x 5 each side holding 10 sec each Hip IT band and adductor stretch 2 x 30 each LE Again today, we spend a fair amount of time educating Li to clarify his understanding the why for each exercise we do as he has a misunderstanding of the correct way to exercise.   Ice to left lateral hip in side lying x 10 min at end of session   02/03/24 Nustep x 6 min level 6 increased to level 7  and then to level 8 during the 6 min time frame (had discussion about high output vs quality exercise) Standing hamstring stretch 2 x 30 each LE [place] stretch 3 x 30 each LE Supine combo hip IR/ER stretch x 5 each side holding 10 sec each Hip IT band and adductor stretch 2 x 30 each LE We spend a fair amount of time educating Kari to clarify his understanding the why for each exercise we do as he has a misunderstanding of the correct way to exercise.   Ice to left lateral hip in side lying x 10 min at end of session   01/29/24 Nustep x 6 min level 6 increased to level 7 at approx 5 min Seated piriformis stretch 3 x 30 HEAVY education on self directing and trying to seek guidance from the internet.   Reminders re: changes in the body with age including tissue changes, natural fusions  and overall rigidity.  Educated patient on proper care for spine and joints to reduce progression of degenerative changes.   Lengthy discussion about whether he needs skilled care as he tends to self direct over most instructions given by PT.   Patient instructed that visit was complete but he wanted to stay and do more stretching since we didn't get to our usual stretches.  Explained to patient that we typically do not do this because he should be supervised while doing this but that since I would be in the room and have visual on him, he could do the stretches that we have previously instructed him on and that PT has observed independence with.  Explained that other patients would be coming in so we would only allow a few more minutes.  Patient was observed doing hamstring stretches and hip flexor/quad stretches properly and then he left.    01/27/24 Nustep x 6 min level 6 increased to level 7 at approx 5 min Standing hamstring stretch 3 x 30 each LE Standing quad/hip flexor stretch 3 x 30  Seated piriformis stretch 3 x 30  HEAVY education on avoiding overly aggressive stretching as this creates pain reflex and muscle guarding/shortening vs elongating Educated on changes in the body with age including tissue changes, natural fusions and overall rigidity.  Educated patient  on proper care for spine and joints to reduce progression of degenerative changes.   Supine IT band stretch 3 x 30 seconds both LE's, patient continued to need vc's on avoiding overly aggressive stretching.  Left hip IT band rolling x 3-4 min Left hip IT band and glut STM with theragun x 3-4 min Butterfly stretch in supine x 5 Patient instructed that visit was complete but he was welcome to rest before leaving.  He remained for an extended period of time and had questions on specific stretching for anterior hip.   12/31/2023 Initial Evaluation & HEP created     PATIENT EDUCATION:  Education details: PT eval findings,  anticipated POC, progress with PT, and initial HEP Person educated: Patient Education method: Explanation, Demonstration, and Handouts Education comprehension: verbalized understanding, returned demonstration, and needs further education  HOME EXERCISE PROGRAM: Access Code: [email or web address] URL: [email or web address] Date: 01/15/2024 Prepared by: Orvil Beuhring  Exercises - Supine Hip Internal and External Rotation  - 1 x daily - 7 x weekly - 1 sets - 10 reps - 5 hold - Seated Hamstring Stretch  - 1 x daily - 7 x weekly - 2 sets - 10 reps - 20-30s hold - Quadricep Stretch with Chair and Counter Support  - 1 x daily - 7 x weekly - 2 sets - 20-30 hold - Supine ITB Stretch with Strap  - 1 x daily - 7 x weekly - 2 sets - 20-30 hold - Supine Hamstring Stretch with Strap  - 1 x daily - 7 x weekly - 2 sets - 20-30s hold - Supine Bridge  - 1 x daily - 7 x weekly - 2 sets - 5 reps - 10 hold - Standard Lunge  - 1 x daily - 7 x weekly - 1 sets - 10 reps - Lateral Lunge  - 1 x daily - 7 x weekly - 1 sets - 10 reps - Side Stepping with Resistance at Feet  - 1 x daily - 7 x weekly - 3 sets - 10 reps  ASSESSMENT:  CLINICAL IMPRESSION: Farouk continues to have left hip pain and is self directing most of his exercises.  PT offers skilled advice but he needs excessive explanation to convince him of the effectiveness and reasoning for what we are doing.  We attempted estim and moist heat today.  He is not making significant progress at this point.  We will assess need to continue in the next few visits.  Offered DN as well but patient declines at this time.      Eval: Patient is a 78 y.o. male who was seen today for physical therapy evaluation and treatment for left hip pain. Lennart presents to skilled therapy with chronic left hip pain that recently flared up 3-4 months ago. He has pain instantly in the mornings when he gets up and it usually goes away with movement, but recently he has not gotten any  relief. He walks 5x a week for 45 mins with a walking group. He enjoys gardening and doing yard work but his left hip gives him some pain but he pushes through it. His sleep is greatly disturbed by his left hip pain. Based on evaluation noted muscle weakness, decreased ROM, poor posture, and decreased flexibility. Patient is motivated and wants to be educated on how to manage pain. Patient will benefit from skilled PT to address the below impairments and improve overall function.   OBJECTIVE IMPAIRMENTS: Abnormal gait, decreased balance, decreased coordination, decreased mobility, difficulty  walking, decreased ROM, decreased strength, hypomobility, impaired perceived functional ability, increased muscle spasms, impaired flexibility, impaired sensation, improper body mechanics, postural dysfunction, and pain.   ACTIVITY LIMITATIONS: bending, sitting, standing, squatting, sleeping, stairs, dressing, hygiene/grooming, and locomotion level  PARTICIPATION LIMITATIONS: meal prep, cleaning, laundry, interpersonal relationship, community activity, and yard work  PERSONAL FACTORS: Age and 1-2 comorbidities: OA; peripheral neuropathy  are also affecting patient's functional outcome.   REHAB POTENTIAL: Good  CLINICAL DECISION MAKING: Evolving/moderate complexity  EVALUATION COMPLEXITY: Moderate   GOALS: Goals reviewed with patient? Yes  SHORT TERM GOALS: Target date: 01/28/2024  Patient will be independent with initial HEP. Baseline:  Goal status: MET 01/13/24  2.  Patient will report > or = to 30% improvement in sleep quality since starting PT. Baseline:  Goal status: INITIAL  3.  Patient will be able to perform 5STS test with no losses of balance. Baseline: anterior LOB Goal status: MET 01/29/24  4.  Patient will be able to participate in  to establish baseline for functional test.  Baseline:  Goal status: MET 01/13/24   LONG TERM GOALS: Target date: 02/25/2024  Patient will  demonstrate independence in advanced HEP. Baseline:  Goal status: In progress  2.  Patient will report > or = to 50% improvement in left hip pain since starting PT. Baseline:  Goal status: In Progress  3.  Patient will be able to garden with < or = to 3/10 left hip pain. Baseline:  Goal status: In Progress  4.  Patient will demonstrate at least 4+/5 strength on left for improved performance of sit to stand and community negotiation. Baseline:  Goal status: In Progress  5.  Patient will score > or = to 66/80 on LEFS due to improved function and decreased disability. Baseline: 56/80  Goal status: In progress    PLAN:  PT FREQUENCY: 2x/week  PT DURATION: 8 weeks  PLANNED INTERVENTIONS: 97164- PT Re-evaluation, 97110-Therapeutic exercises, 97530- Therapeutic activity, 97112- Neuromuscular re-education, 97535- Self Care, [id number]- Manual therapy, [phone]- Gait training, [phone]- Canalith repositioning, [id number]- Aquatic Therapy, [phone]- Electrical stimulation (unattended), [phone]- Electrical stimulation (manual), [id number]- Vasopneumatic device, [id number]- Ultrasound, [id number]- Traction (mechanical), [id number]- Ionotophoresis 4mg /ml Dexamethasone, [id number] (1-2 muscles), [id number] (3+ muscles)- Dry Needling, Patient/Family education, Balance training, Stair training, Taping, Joint mobilization, Joint manipulation, Spinal manipulation, Spinal mobilization, Vestibular training, Cryotherapy, and Moist heat  PLAN FOR NEXT SESSION:  Assess response to moist heat with estim, NuStep, hip flexibility; postural strengthening; hip strengthening, gait mechanics   Marc Leichter B. Yeray Tomas, PT 02/11/24 5:50 PM [facility] [address], [address] [place], [place] [address] Phone # [phone] Fax [phone]

## 2024-02-12 ENCOUNTER — Ambulatory Visit

## 2024-02-12 DIAGNOSIS — M25552 Pain in left hip: Secondary | ICD-10-CM | POA: Diagnosis not present

## 2024-02-12 DIAGNOSIS — R262 Difficulty in walking, not elsewhere classified: Secondary | ICD-10-CM

## 2024-02-12 DIAGNOSIS — M6281 Muscle weakness (generalized): Secondary | ICD-10-CM

## 2024-02-12 DIAGNOSIS — R293 Abnormal posture: Secondary | ICD-10-CM

## 2024-02-12 DIAGNOSIS — R252 Cramp and spasm: Secondary | ICD-10-CM | POA: Diagnosis not present

## 2024-02-12 NOTE — Therapy (Signed)
 OUTPATIENT PHYSICAL THERAPY LOWER EXTREMITY TREATMENT NOTE   [name] MRN: [id number] DOB:10/13/1945, 78 y.o., male Today's Date: 02/12/2024  END OF SESSION:  PT End of Session - 02/12/24 1459     Visit Number 12    Number of Visits 16    Date for Recertification  02/25/24    Authorization Type [employer] Approved 16 vls 12/31/23-02/26/24    Authorization - Visit Number 12    Authorization - Number of Visits 16    Progress Note Due on Visit 20    PT Start Time 1448    PT Stop Time 1530    PT Time Calculation (min) 42 min    Activity Tolerance Patient tolerated treatment well    Behavior During Therapy WFL for tasks assessed/performed            Past Medical History:  Diagnosis Date   Abnormality of gait    Arthritis    Atrial fibrillation (HCC)    BPH (benign prostatic hyperplasia)    Bradycardia    Cardiomyopathy (HCC)    Cervical disc disorder    Chicken pox    Closed fracture of one or more phalanges of foot    Clotting disorder    low platelets   Combined systolic and diastolic cardiac dysfunction    DDD (degenerative disc disease), cervical    Depression    Elevated PSA    Fracture of foot bone, right, closed 1964   GERD (gastroesophageal reflux disease)    Hammer toe    Hayfever    Hearing loss    Heart murmur    Hyperlipidemia    Neoplasm of uncertain behavior of skin    Pilonidal cyst with abscess    Polyneuropathy    Rheumatic fever    Seasonal allergies    Talipes cavus    Tinnitus    Past Surgical History:  Procedure Laterality Date   COLONOSCOPY     2006 normal exam   INGUINAL HERNIA REPAIR     PILONIDAL CYST EXCISION  1966   WISDOM TOOTH EXTRACTION     Patient Active Problem List   Diagnosis Date Noted   Malignant neoplasm of prostate (HCC) 04/19/2022   Bilateral carpal tunnel syndrome 08/18/2020   Midline low back pain without sciatica 09/30/2019   Atrophy of muscle of right lower leg 09/13/2019   Lumbar  radiculopathy 09/13/2019   Peripheral neuropathy 09/13/2019   Gait abnormality 08/19/2019   Paresthesia 08/19/2019   Neck pain 08/19/2019   Memory loss 08/19/2019   Chronic diastolic heart failure (HCC) 03/21/2016   Murmur, cardiac 01/15/2016   Dyspnea 01/15/2016   Abnormal ECG 01/15/2016   Hypertriglyceridemia 01/15/2016    PCP: Merna Huxley, NP  REFERRING PROVIDER: Merna Huxley, NP  REFERRING DIAG: [id number] (ICD-10-CM) - Left leg pain   THERAPY DIAG:  Pain in left hip  Muscle weakness (generalized)  Abnormal posture  Cramp and spasm  Difficulty in walking, not elsewhere classified  Rationale for Evaluation and Treatment: Rehabilitation  ONSET DATE: 3-4 months ago  SUBJECTIVE:   SUBJECTIVE STATEMENT: Patient reports he has experience a revelation.  If I don't stretch so aggressively, I get more relief. Patient reports his lateral hip is not hurting too bad today.    Eval: Patient presents with left hip pain that is usually worse in the mornings. He went to Dr. 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 He reports Dr. Hiram said he does not need  a hip replacement now, but he will in the future. He went on two trips to [place] and the required a lot of sitting. He struggled to sit in the very low chairs and he would like to be able to do that. He walks 5x a week with a group for 45 mins.   PERTINENT HISTORY: Peripheral neuropathy ; OA; Depression; DDD; Hx low back pain PAIN:  02/10/24 Are you having pain? Yes: NPRS scale: 2/10 Pain location: front and side of his left quad Pain description: achy Aggravating factors: getting out of bed; walking;  Relieving factors: stretching   PRECAUTIONS: None  RED FLAGS: None   WEIGHT BEARING RESTRICTIONS: No  FALLS:  Has patient fallen in last 6 months? No  LIVING ENVIRONMENT: Lives with: lives alone Lives in: House/apartment Stairs: No Has following equipment at home: He has a cane, walker,  shower chair, but he does not use anything  OCCUPATION: Retired   PLOF: Independent, Independent with basic ADLs, Independent with household mobility without device, Independent with community mobility without device, Independent with gait, Independent with transfers, and Leisure: Working in the yard, garden , working on cars  PATIENT GOALS: To get rid of pain  NEXT MD VISIT: PRN  OBJECTIVE:  Note: Objective measures were completed at Evaluation unless otherwise noted.  DIAGNOSTIC FINDINGS: Patient reports Lt hip arthritis (Dr. Hiram)   PATIENT SURVEYS:  LEFS: 56/80 70%   COGNITION: Overall cognitive status: Within functional limits for tasks assessed     SENSATION: Patient had peripheral neuropathy   MUSCLE LENGTH: Hamstrings: decreased bilateral Lt > Rt    POSTURE: rounded shoulders, forward head, increased thoracic kyphosis, and weight shift right  PALPATION: Tenderness around Lt hip joint. Patient has a deep achy pain in left hip pain.  LOWER EXTREMITY ROM: Left hip PROM limited in all directions  Lt >Rt  ; patient unable to achieve FABER position on left. Patient has pain at end ranges.  LOWER EXTREMITY MMT: Grossly 4+/5 on right; 4 to 4+/5 on Lt some pain with left hip abduction testing;   FUNCTIONAL TESTS:  5 times sit to stand: 9.18 sec no UE support;  uncontrolled descend; anterior LOB requiring stepping strategy   Timed up and go (TUG): 5.00 sec     : 1757.2 feet GAIT:  Comments: Right lateral trunk lean; antalgic gait                                                                                                                               TREATMENT DATE:  02/12/24 Standing hamstring stretch 3 x 30 Standing quad/hip flexor stretch 3 x 30 Seated piriformis stretch 3 x 30   02/10/24 Nustep x 6 min level 6 increased to level 7  (Patient was being very aggressive on the bike and it was suggested that he slow down and reduce his resistance level.   Patient states well I am at a lower resistance (he was  at the same resistance of 7 that he started out on) and goes on to explain that when you're biking, you have to stay at a certain pace.  PT explains to patient that this may be true but he is in a rehab state and that we are trying to get the bursa to calm down and that his aggressive approach is causing irritation.   Seated piriformis stretch 3 x 30 sec Supine combo hip IR/ER stretch x 5 each side holding 10 sec each Hip IT band and adductor stretch 2 x 30 each LE Side lying on right side with IT band rolling and manual STM Moist heat with premodulated estim to left lateral hip x 15 min  02/05/24 Nustep x 6 min level 6 increased to level 7   Seated piriformis stretch 3 x 30 sec Taught supine piriformis stretch (patient had multiple questions about specific muscles we are stretching) Supine combo hip IR/ER stretch x 5 each side holding 10 sec each Hip IT band and adductor stretch 2 x 30 each LE Again today, we spend a fair amount of time educating Teng to clarify his understanding the why for each exercise we do as he has a misunderstanding of the correct way to exercise.   Ice to left lateral hip in side lying x 10 min at end of session   02/03/24 Nustep x 6 min level 6 increased to level 7  and then to level 8 during the 6 min time frame (had discussion about high output vs quality exercise) Standing hamstring stretch 2 x 30 each LE [place] stretch 3 x 30 each LE Supine combo hip IR/ER stretch x 5 each side holding 10 sec each Hip IT band and adductor stretch 2 x 30 each LE We spend a fair amount of time educating Makarios to clarify his understanding the why for each exercise we do as he has a misunderstanding of the correct way to exercise.   Ice to left lateral hip in side lying x 10 min at end of session   01/29/24 Nustep x 6 min level 6 increased to level 7 at approx 5 min Seated piriformis stretch 3 x 30 HEAVY  education on self directing and trying to seek guidance from the internet.   Reminders re: changes in the body with age including tissue changes, natural fusions and overall rigidity.  Educated patient on proper care for spine and joints to reduce progression of degenerative changes.   Lengthy discussion about whether he needs skilled care as he tends to self direct over most instructions given by PT.   Patient instructed that visit was complete but he wanted to stay and do more stretching since we didn't get to our usual stretches.  Explained to patient that we typically do not do this because he should be supervised while doing this but that since I would be in the room and have visual on him, he could do the stretches that we have previously instructed him on and that PT has observed independence with.  Explained that other patients would be coming in so we would only allow a few more minutes.  Patient was observed doing hamstring stretches and hip flexor/quad stretches properly and then he left.    01/27/24 Nustep x 6 min level 6 increased to level 7 at approx 5 min Standing hamstring stretch 3 x 30 each LE Standing quad/hip flexor stretch 3 x 30  Seated piriformis stretch 3 x 30  HEAVY education on  avoiding overly aggressive stretching as this creates pain reflex and muscle guarding/shortening vs elongating Educated on changes in the body with age including tissue changes, natural fusions and overall rigidity.  Educated patient on proper care for spine and joints to reduce progression of degenerative changes.   Supine IT band stretch 3 x 30 seconds both LE's, patient continued to need vc's on avoiding overly aggressive stretching.  Left hip IT band rolling x 3-4 min Left hip IT band and glut STM with theragun x 3-4 min Butterfly stretch in supine x 5 Patient instructed that visit was complete but he was welcome to rest before leaving.  He remained for an extended period of time and had  questions on specific stretching for anterior hip.   12/31/2023 Initial Evaluation & HEP created     PATIENT EDUCATION:  Education details: PT eval findings, anticipated POC, progress with PT, and initial HEP Person educated: Patient Education method: Explanation, Demonstration, and Handouts Education comprehension: verbalized understanding, returned demonstration, and needs further education  HOME EXERCISE PROGRAM: Access Code: [email or web address] URL: [email or web address] Date: 02/12/2024 Prepared by: Delon Haddock  Exercises - Supine Hip Internal and External Rotation  - 1 x daily - 7 x weekly - 1 sets - 10 reps - 5 hold - Seated Hamstring Stretch  - 1 x daily - 7 x weekly - 2 sets - 10 reps - 20-30s hold - Supine ITB Stretch with Strap  - 1 x daily - 7 x weekly - 2 sets - 20-30 hold - Supine Hamstring Stretch with Strap  - 1 x daily - 7 x weekly - 2 sets - 20-30s hold - Supine Bridge  - 1 x daily - 7 x weekly - 2 sets - 5 reps - 10 hold - Standard Lunge  - 1 x daily - 7 x weekly - 1 sets - 10 reps - Lateral Lunge  - 1 x daily - 7 x weekly - 1 sets - 10 reps - Side Stepping with Resistance at Feet  - 1 x daily - 7 x weekly - 3 sets - 10 reps - Standing Hamstring Stretch on Chair  - 1 x daily - 7 x weekly - 3 sets - 10 reps - Theatre Manager with Chair and Counter Support  - 1 x daily - 7 x weekly - 3 sets - 10 reps  ASSESSMENT:  CLINICAL IMPRESSION: [place] was more receptive to instruction today but still directs the time per each rep (for example, he's instructed to hold for 30 sec but chooses to hold for 1-2 minutes)  and needs reminders that his session is 45 minutes and we won't have time to move on to other exercises if we are reviewing and spending so much time on the same stretches that he has been doing each session and at home.  We, once again, educated patient on the nature of our tissues as we age and that we want to be consistent with stretching to avoid losing  function but that he is not going to gain the flexibility and resume the length of the muscle that he had when he was in his 20's and 30's.  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  We completed 4 stretches today with good technique and less aggressive approach.  However, patient held the stretches for far longer time period that instructed and had numerous questions that needed further explanation.  These subjects have been discussed and explained  during multiple visits. These behaviors impede our ability more effective therapy today.    Eval: Patient is a 78 y.o. male who was seen today for physical therapy evaluation and treatment for left hip pain. Ankith presents to skilled therapy with chronic left hip pain that recently flared up 3-4 months ago. He has pain instantly in the mornings when he gets up and it usually goes away with movement, but recently he has not gotten any relief. He walks 5x a week for 45 mins with a walking group. He enjoys gardening and doing yard work but his left hip gives him some pain but he pushes through it. His sleep is greatly disturbed by his left hip pain. Based on evaluation noted muscle weakness, decreased ROM, poor posture, and decreased flexibility. Patient is motivated and wants to be educated on how to manage pain. Patient will benefit from skilled PT to address the below impairments and improve overall function.   OBJECTIVE IMPAIRMENTS: Abnormal gait, decreased balance, decreased coordination, decreased mobility, difficulty walking, decreased ROM, decreased strength, hypomobility, impaired perceived functional ability, increased muscle spasms, impaired flexibility, impaired sensation, improper body mechanics, postural dysfunction, and pain.   ACTIVITY LIMITATIONS: bending, sitting, standing, squatting, sleeping, stairs, dressing, hygiene/grooming, and locomotion level  PARTICIPATION LIMITATIONS: meal prep, cleaning, laundry,  interpersonal relationship, community activity, and yard work  PERSONAL FACTORS: Age and 1-2 comorbidities: OA; peripheral neuropathy  are also affecting patient's functional outcome.   REHAB POTENTIAL: Good  CLINICAL DECISION MAKING: Evolving/moderate complexity  EVALUATION COMPLEXITY: Moderate   GOALS: Goals reviewed with patient? Yes  SHORT TERM GOALS: Target date: 01/28/2024  Patient will be independent with initial HEP. Baseline:  Goal status: MET 01/13/24  2.  Patient will report > or = to 30% improvement in sleep quality since starting PT. Baseline:  Goal status: INITIAL  3.  Patient will be able to perform 5STS test with no losses of balance. Baseline: anterior LOB Goal status: MET 01/29/24  4.  Patient will be able to participate in  to establish baseline for functional test.  Baseline:  Goal status: MET 01/13/24   LONG TERM GOALS: Target date: 02/25/2024  Patient will demonstrate independence in advanced HEP. Baseline:  Goal status: In progress  2.  Patient will report > or = to 50% improvement in left hip pain since starting PT. Baseline:  Goal status: In Progress  3.  Patient will be able to garden with < or = to 3/10 left hip pain. Baseline:  Goal status: In Progress  4.  Patient will demonstrate at least 4+/5 strength on left for improved performance of sit to stand and community negotiation. Baseline:  Goal status: In Progress  5.  Patient will score > or = to 66/80 on LEFS due to improved function and decreased disability. Baseline: 56/80  Goal status: In progress    PLAN:  PT FREQUENCY: 2x/week  PT DURATION: 8 weeks  PLANNED INTERVENTIONS: 97164- PT Re-evaluation, 97110-Therapeutic exercises, 97530- Therapeutic activity, 97112- Neuromuscular re-education, 97535- Self Care, 02859- Manual therapy, [phone]- Gait training, [phone]- Canalith repositioning, [id number]- Aquatic Therapy, [phone]- Electrical stimulation (unattended), [phone]- Electrical  stimulation (manual), [id number]- Vasopneumatic device, [id number]- Ultrasound, [id number]- Traction (mechanical), [id number]- Ionotophoresis 4mg /ml Dexamethasone, [id number] (1-2 muscles), [id number] (3+ muscles)- Dry Needling, Patient/Family education, Balance training, Stair training, Taping, Joint mobilization, Joint manipulation, Spinal manipulation, Spinal mobilization, Vestibular training, Cryotherapy, and Moist heat  PLAN FOR NEXT SESSION:  NuStep, hip flexibility; postural strengthening; hip strengthening, gait mechanics   Artez Regis B. Peyton Spengler, PT  02/12/24 8:34 PM [facility] [address], [address] [place], [place] [address] Phone # [phone] Fax [phone]

## 2024-02-17 ENCOUNTER — Ambulatory Visit

## 2024-02-17 DIAGNOSIS — R262 Difficulty in walking, not elsewhere classified: Secondary | ICD-10-CM

## 2024-02-17 DIAGNOSIS — M6281 Muscle weakness (generalized): Secondary | ICD-10-CM | POA: Diagnosis not present

## 2024-02-17 DIAGNOSIS — R293 Abnormal posture: Secondary | ICD-10-CM | POA: Diagnosis not present

## 2024-02-17 DIAGNOSIS — M25552 Pain in left hip: Secondary | ICD-10-CM | POA: Diagnosis not present

## 2024-02-17 DIAGNOSIS — R252 Cramp and spasm: Secondary | ICD-10-CM

## 2024-02-17 NOTE — Therapy (Signed)
 OUTPATIENT PHYSICAL THERAPY LOWER EXTREMITY TREATMENT NOTE   [name] MRN: [id number] DOB:1945-10-22, 78 y.o., male Today's Date: 02/17/2024  END OF SESSION:  PT End of Session - 02/17/24 1456     Visit Number 13    Number of Visits 16    Date for Recertification  02/25/24    Authorization Type [employer] Approved 16 vls 12/31/23-02/26/24    Authorization - Visit Number 13    Authorization - Number of Visits 16    Progress Note Due on Visit 20    PT Start Time 1452    PT Stop Time 1535    PT Time Calculation (min) 43 min    Activity Tolerance Patient tolerated treatment well    Behavior During Therapy WFL for tasks assessed/performed            Past Medical History:  Diagnosis Date   Abnormality of gait    Arthritis    Atrial fibrillation (HCC)    BPH (benign prostatic hyperplasia)    Bradycardia    Cardiomyopathy (HCC)    Cervical disc disorder    Chicken pox    Closed fracture of one or more phalanges of foot    Clotting disorder    low platelets   Combined systolic and diastolic cardiac dysfunction    DDD (degenerative disc disease), cervical    Depression    Elevated PSA    Fracture of foot bone, right, closed 1964   GERD (gastroesophageal reflux disease)    Hammer toe    Hayfever    Hearing loss    Heart murmur    Hyperlipidemia    Neoplasm of uncertain behavior of skin    Pilonidal cyst with abscess    Polyneuropathy    Rheumatic fever    Seasonal allergies    Talipes cavus    Tinnitus    Past Surgical History:  Procedure Laterality Date   COLONOSCOPY     2006 normal exam   INGUINAL HERNIA REPAIR     PILONIDAL CYST EXCISION  1966   WISDOM TOOTH EXTRACTION     Patient Active Problem List   Diagnosis Date Noted   Malignant neoplasm of prostate (HCC) 04/19/2022   Bilateral carpal tunnel syndrome 08/18/2020   Midline low back pain without sciatica 09/30/2019   Atrophy of muscle of right lower leg 09/13/2019   Lumbar  radiculopathy 09/13/2019   Peripheral neuropathy 09/13/2019   Gait abnormality 08/19/2019   Paresthesia 08/19/2019   Neck pain 08/19/2019   Memory loss 08/19/2019   Chronic diastolic heart failure (HCC) 03/21/2016   Murmur, cardiac 01/15/2016   Dyspnea 01/15/2016   Abnormal ECG 01/15/2016   Hypertriglyceridemia 01/15/2016    PCP: Merna Huxley, NP  REFERRING PROVIDER: Merna Huxley, NP  REFERRING DIAG: [phone] (ICD-10-CM) - Left leg pain   THERAPY DIAG:  Pain in left hip  Muscle weakness (generalized)  Cramp and spasm  Abnormal posture  Difficulty in walking, not elsewhere classified  Rationale for Evaluation and Treatment: Rehabilitation  ONSET DATE: 3-4 months ago  SUBJECTIVE:   SUBJECTIVE STATEMENT: Patient states the IT band is more irritable today due to climbing up on ladder to help the painters painting his house.    Eval: Patient presents with left hip pain that is usually worse in the mornings. He went to Dr. 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 He reports Dr. Hiram said he does not need a hip replacement now, but he will  in the future. He went on two trips to [place] and the required a lot of sitting. He struggled to sit in the very low chairs and he would like to be able to do that. He walks 5x a week with a group for 45 mins.   PERTINENT HISTORY: Peripheral neuropathy ; OA; Depression; DDD; Hx low back pain PAIN:  02/17/24 Are you having pain? Yes: NPRS scale: 1-2/10 Pain location: front and side of his left quad Pain description: achy Aggravating factors: getting out of bed; walking;  Relieving factors: stretching   PRECAUTIONS: None  RED FLAGS: None   WEIGHT BEARING RESTRICTIONS: No  FALLS:  Has patient fallen in last 6 months? No  LIVING ENVIRONMENT: Lives with: lives alone Lives in: House/apartment Stairs: No Has following equipment at home: He has a cane, walker, shower chair, but he does not use  anything  OCCUPATION: Retired   PLOF: Independent, Independent with basic ADLs, Independent with household mobility without device, Independent with community mobility without device, Independent with gait, Independent with transfers, and Leisure: Working in the yard, garden , working on cars  PATIENT GOALS: To get rid of pain  NEXT MD VISIT: PRN  OBJECTIVE:  Note: Objective measures were completed at Evaluation unless otherwise noted.  DIAGNOSTIC FINDINGS: Patient reports Lt hip arthritis (Dr. Hiram)   PATIENT SURVEYS:  LEFS: 56/80 70%   COGNITION: Overall cognitive status: Within functional limits for tasks assessed     SENSATION: Patient had peripheral neuropathy   MUSCLE LENGTH: Hamstrings: decreased bilateral Lt > Rt    POSTURE: rounded shoulders, forward head, increased thoracic kyphosis, and weight shift right  PALPATION: Tenderness around Lt hip joint. Patient has a deep achy pain in left hip pain.  LOWER EXTREMITY ROM: Left hip PROM limited in all directions  Lt >Rt  ; patient unable to achieve FABER position on left. Patient has pain at end ranges.  LOWER EXTREMITY MMT: Grossly 4+/5 on right; 4 to 4+/5 on Lt some pain with left hip abduction testing;   FUNCTIONAL TESTS:  5 times sit to stand: 9.18 sec no UE support;  uncontrolled descend; anterior LOB requiring stepping strategy   Timed up and go (TUG): 5.00 sec     : 1757.2 feet GAIT:  Comments: Right lateral trunk lean; antalgic gait                                                                                                                               TREATMENT DATE:  02/17/24 Nustep x 5 min level 5 (patient reminded to back off on aggressiveness Supine IT band stretch 3 x 30 left Patient wanted to review several of the stretches on his HEP and then mentioned that there were some stretches that we had given him that weren't on his HEP.  PT examined HEP and all stretches that we had done in  clinic were on the HEP.  Asked patient if he recalls  what was not included on the HEP and he could not recall.  Also reminded patient that we don't give every single Standing adductor lunge stretch 3 x 30 Standing [place] stretch 3 x 30 Standing hamstring stretch while reviewing all other HEP 3 x 30 Trigger Point Dry Needling Initial Treatment: Pt instructed on Dry Needling rational, procedures, and possible side effects. Pt instructed to expect mild to moderate muscle soreness later in the day and/or into the next day.  Pt instructed in methods to reduce muscle soreness. Pt instructed to continue prescribed HEP. Because Dry Needling was performed over or adjacent to a lung field, pt was educated on S/S of pneumothorax and to seek immediate medical attention should they occur.  Patient was educated on signs and symptoms of infection and other risk factors and advised to seek medical attention should they occur.  Patient verbalized understanding of these instructions and education.  Patient Verbal Consent Given: Yes Education Handout Provided: Yes Muscles Treated: lateral quad Electrical Stimulation Performed: No Treatment Response/Outcome: Skilled palpation used to identify taut bands and trigger points.  Once identified, dry needling techniques used to treat these areas.  Twitch response ellicited along with palpable elongation of muscle.  Following treatment, patient reported the left leg felt less full.     02/12/24 Standing hamstring stretch 3 x 30 Standing quad/hip flexor stretch 3 x 30 Seated piriformis stretch 3 x 30 Time expired due to conversation and instruction  02/10/24 Nustep x 6 min level 6 increased to level 7  (Patient was being very aggressive on the bike and it was suggested that he slow down and reduce his resistance level.  Patient states well I am at a lower resistance (he was at the same resistance of 7 that he started out on) and goes on to explain that  when you're biking, you have to stay at a certain pace.  PT explains to patient that this may be true but he is in a rehab state and that we are trying to get the bursa to calm down and that his aggressive approach is causing irritation.   Seated piriformis stretch 3 x 30 sec Supine combo hip IR/ER stretch x 5 each side holding 10 sec each Hip IT band and adductor stretch 2 x 30 each LE Side lying on right side with IT band rolling and manual STM Moist heat with premodulated estim to left lateral hip x 15 min   12/31/2023 Initial Evaluation & HEP created     PATIENT EDUCATION:  Education details: PT eval findings, anticipated POC, progress with PT, and initial HEP Person educated: Patient Education method: Explanation, Demonstration, and Handouts Education comprehension: verbalized understanding, returned demonstration, and needs further education  HOME EXERCISE PROGRAM: Access Code: [email or web address] URL: [email or web address] Date: 02/12/2024 Prepared by: Delon Haddock  Exercises - Supine Hip Internal and External Rotation  - 1 x daily - 7 x weekly - 1 sets - 10 reps - 5 hold - Seated Hamstring Stretch  - 1 x daily - 7 x weekly - 2 sets - 10 reps - 20-30s hold - Supine ITB Stretch with Strap  - 1 x daily - 7 x weekly - 2 sets - 20-30 hold - Supine Hamstring Stretch with Strap  - 1 x daily - 7 x weekly - 2 sets - 20-30s hold - Supine Bridge  - 1 x daily - 7 x weekly - 2 sets - 5 reps - 10 hold - Standard Lunge  - 1 x  daily - 7 x weekly - 1 sets - 10 reps - Lateral Lunge  - 1 x daily - 7 x weekly - 1 sets - 10 reps - Side Stepping with Resistance at Feet  - 1 x daily - 7 x weekly - 3 sets - 10 reps - Standing Hamstring Stretch on Chair  - 1 x daily - 7 x weekly - 3 sets - 10 reps - Quadricep Stretch with Chair and Counter Support  - 1 x daily - 7 x weekly - 3 sets - 10 reps  ASSESSMENT:  CLINICAL IMPRESSION: Landyn was interested in reviewing more IT band stretches.  PT  explained that there are only so many ways to stretch the IT band and what we had given him and had been doing the past several visits are it.  Explained to patient that there is also rolling that is helpful.  He then asked to review his HEP.  We completed this and did DN today to see if this would release some tension to the lateral aspect of the hip.  He reported that the leg felt less full after treatment.      Eval: Patient is a 78 y.o. male who was seen today for physical therapy evaluation and treatment for left hip pain. Lajarvis presents to skilled therapy with chronic left hip pain that recently flared up 3-4 months ago. He has pain instantly in the mornings when he gets up and it usually goes away with movement, but recently he has not gotten any relief. He walks 5x a week for 45 mins with a walking group. He enjoys gardening and doing yard work but his left hip gives him some pain but he pushes through it. His sleep is greatly disturbed by his left hip pain. Based on evaluation noted muscle weakness, decreased ROM, poor posture, and decreased flexibility. Patient is motivated and wants to be educated on how to manage pain. Patient will benefit from skilled PT to address the below impairments and improve overall function.   OBJECTIVE IMPAIRMENTS: Abnormal gait, decreased balance, decreased coordination, decreased mobility, difficulty walking, decreased ROM, decreased strength, hypomobility, impaired perceived functional ability, increased muscle spasms, impaired flexibility, impaired sensation, improper body mechanics, postural dysfunction, and pain.   ACTIVITY LIMITATIONS: bending, sitting, standing, squatting, sleeping, stairs, dressing, hygiene/grooming, and locomotion level  PARTICIPATION LIMITATIONS: meal prep, cleaning, laundry, interpersonal relationship, community activity, and yard work  PERSONAL FACTORS: Age and 1-2 comorbidities: OA; peripheral neuropathy  are also affecting patient's  functional outcome.   REHAB POTENTIAL: Good  CLINICAL DECISION MAKING: Evolving/moderate complexity  EVALUATION COMPLEXITY: Moderate   GOALS: Goals reviewed with patient? Yes  SHORT TERM GOALS: Target date: 01/28/2024  Patient will be independent with initial HEP. Baseline:  Goal status: MET 01/13/24  2.  Patient will report > or = to 30% improvement in sleep quality since starting PT. Baseline:  Goal status: INITIAL  3.  Patient will be able to perform 5STS test with no losses of balance. Baseline: anterior LOB Goal status: MET 01/29/24  4.  Patient will be able to participate in  to establish baseline for functional test.  Baseline:  Goal status: MET 01/13/24   LONG TERM GOALS: Target date: 02/25/2024  Patient will demonstrate independence in advanced HEP. Baseline:  Goal status: MET 02/17/24  2.  Patient will report > or = to 50% improvement in left hip pain since starting PT. Baseline:  Goal status: In Progress  3.  Patient will be able  to garden with < or = to 3/10 left hip pain. Baseline:  Goal status: In Progress  4.  Patient will demonstrate at least 4+/5 strength on left for improved performance of sit to stand and community negotiation. Baseline:  Goal status: In Progress  5.  Patient will score > or = to 66/80 on LEFS due to improved function and decreased disability. Baseline: 56/80  Goal status: In progress    PLAN:  PT FREQUENCY: 2x/week  PT DURATION: 8 weeks  PLANNED INTERVENTIONS: 97164- PT Re-evaluation, 97110-Therapeutic exercises, 97530- Therapeutic activity, [id number]- Neuromuscular re-education, 97535- Self Care, [id number]- Manual therapy, [id number]- Gait training, [phone]- Canalith repositioning, [id number]- Aquatic Therapy, [id number]- Electrical stimulation (unattended), [id number]- Electrical stimulation (manual), [id number]- Vasopneumatic device, [id number]- Ultrasound, [id number]- Traction (mechanical), [id number]- Ionotophoresis 4mg /ml Dexamethasone, 79439 (1-2 muscles),  [id number] (3+ muscles)- Dry Needling, Patient/Family education, Balance training, Stair training, Taping, Joint mobilization, Joint manipulation, Spinal manipulation, Spinal mobilization, Vestibular training, Cryotherapy, and Moist heat  PLAN FOR NEXT SESSION:  NuStep, hip flexibility; postural strengthening; hip strengthening, gait mechanics   Timothy Trudell B. Cartier Mapel, PT 02/17/24 4:59 PM [facility] [address] [place], [place] [address] Phone # [phone] Fax [phone]

## 2024-02-19 ENCOUNTER — Ambulatory Visit

## 2024-02-19 DIAGNOSIS — R262 Difficulty in walking, not elsewhere classified: Secondary | ICD-10-CM | POA: Diagnosis not present

## 2024-02-19 DIAGNOSIS — M6281 Muscle weakness (generalized): Secondary | ICD-10-CM | POA: Diagnosis not present

## 2024-02-19 DIAGNOSIS — M25552 Pain in left hip: Secondary | ICD-10-CM

## 2024-02-19 DIAGNOSIS — R293 Abnormal posture: Secondary | ICD-10-CM

## 2024-02-19 DIAGNOSIS — R252 Cramp and spasm: Secondary | ICD-10-CM

## 2024-02-19 NOTE — Therapy (Signed)
 OUTPATIENT PHYSICAL THERAPY LOWER EXTREMITY TREATMENT NOTE   [name] MRN: [id number] DOB:1945-05-15, 78 y.o., male Today's Date: 02/19/2024  END OF SESSION:  PT End of Session - 02/19/24 1454     Visit Number 14    Number of Visits 16    Date for Recertification  02/25/24    Authorization Type [employer] Approved 16 vls 12/31/23-02/26/24    Authorization - Visit Number 14    Authorization - Number of Visits 16    Progress Note Due on Visit 20    PT Start Time 1445    Activity Tolerance Patient tolerated treatment well    Behavior During Therapy [facility] for tasks assessed/performed            Past Medical History:  Diagnosis Date   Abnormality of gait    Arthritis    Atrial fibrillation (HCC)    BPH (benign prostatic hyperplasia)    Bradycardia    Cardiomyopathy (HCC)    Cervical disc disorder    Chicken pox    Closed fracture of one or more phalanges of foot    Clotting disorder    low platelets   Combined systolic and diastolic cardiac dysfunction    DDD (degenerative disc disease), cervical    Depression    Elevated PSA    Fracture of foot bone, right, closed 1964   GERD (gastroesophageal reflux disease)    Hammer toe    Hayfever    Hearing loss    Heart murmur    Hyperlipidemia    Neoplasm of uncertain behavior of skin    Pilonidal cyst with abscess    Polyneuropathy    Rheumatic fever    Seasonal allergies    Talipes cavus    Tinnitus    Past Surgical History:  Procedure Laterality Date   COLONOSCOPY     2006 normal exam   INGUINAL HERNIA REPAIR     PILONIDAL CYST EXCISION  1966   WISDOM TOOTH EXTRACTION     Patient Active Problem List   Diagnosis Date Noted   Malignant neoplasm of prostate (HCC) 04/19/2022   Bilateral carpal tunnel syndrome 08/18/2020   Midline low back pain without sciatica 09/30/2019   Atrophy of muscle of right lower leg 09/13/2019   Lumbar radiculopathy 09/13/2019   Peripheral neuropathy 09/13/2019   Gait  abnormality 08/19/2019   Paresthesia 08/19/2019   Neck pain 08/19/2019   Memory loss 08/19/2019   Chronic diastolic heart failure (HCC) 03/21/2016   Murmur, cardiac 01/15/2016   Dyspnea 01/15/2016   Abnormal ECG 01/15/2016   Hypertriglyceridemia 01/15/2016    PCP: Merna Huxley, NP  REFERRING PROVIDER: Merna Huxley, NP  REFERRING DIAG: [phone] (ICD-10-CM) - Left leg pain   THERAPY DIAG:  Pain in left hip  Muscle weakness (generalized)  Abnormal posture  Difficulty in walking, not elsewhere classified  Cramp and spasm  Rationale for Evaluation and Treatment: Rehabilitation  ONSET DATE: 3-4 months ago  SUBJECTIVE:   SUBJECTIVE STATEMENT: Patient states doing ok today.  Felt that the DN did help.     Eval: Patient presents with left hip pain that is usually worse in the mornings. He went to Dr. 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 He reports Dr. Hiram said he does not need a hip replacement now, but he will in the future. He went on two trips to [place] and the required a lot of sitting. He struggled to sit in the very low  chairs and he would like to be able to do that. He walks 5x a week with a group for 45 mins.   PERTINENT HISTORY: Peripheral neuropathy ; OA; Depression; DDD; Hx low back pain PAIN:  02/17/24 Are you having pain? Yes: NPRS scale: 1-2/10 Pain location: front and side of his left quad Pain description: achy Aggravating factors: getting out of bed; walking;  Relieving factors: stretching   PRECAUTIONS: None  RED FLAGS: None   WEIGHT BEARING RESTRICTIONS: No  FALLS:  Has patient fallen in last 6 months? No  LIVING ENVIRONMENT: Lives with: lives alone Lives in: House/apartment Stairs: No Has following equipment at home: He has a cane, walker, shower chair, but he does not use anything  OCCUPATION: Retired   PLOF: Independent, Independent with basic ADLs, Independent with household mobility without device,  Independent with community mobility without device, Independent with gait, Independent with transfers, and Leisure: Working in the yard, garden , working on cars  PATIENT GOALS: To get rid of pain  NEXT MD VISIT: PRN  OBJECTIVE:  Note: Objective measures were completed at Evaluation unless otherwise noted.  DIAGNOSTIC FINDINGS: Patient reports Lt hip arthritis (Dr. Hiram)   PATIENT SURVEYS:  LEFS: 56/80 70%   COGNITION: Overall cognitive status: Within functional limits for tasks assessed     SENSATION: Patient had peripheral neuropathy   MUSCLE LENGTH: Hamstrings: decreased bilateral Lt > Rt    POSTURE: rounded shoulders, forward head, increased thoracic kyphosis, and weight shift right  PALPATION: Tenderness around Lt hip joint. Patient has a deep achy pain in left hip pain.  LOWER EXTREMITY ROM: Left hip PROM limited in all directions  Lt >Rt  ; patient unable to achieve FABER position on left. Patient has pain at end ranges.  LOWER EXTREMITY MMT: Grossly 4+/5 on right; 4 to 4+/5 on Lt some pain with left hip abduction testing;   FUNCTIONAL TESTS:  5 times sit to stand: 9.18 sec no UE support;  uncontrolled descend; anterior LOB requiring stepping strategy   Timed up and go (TUG): 5.00 sec     : 1757.2 feet GAIT:  Comments: Right lateral trunk lean; antalgic gait                                                                                                                               TREATMENT DATE:  02/19/24 Nustep x 5 min level 5 (patient reminded to back off on aggressiveness Supine IT band stretch 2 x 30 both Supine adductor stretch 2 x 30  both Supine butterfly stretch 5 x 30 both Standing adductor lunge stretch 3 x 30 both Bridging x 20 Standing Captain Morgan stretch 3 x 30 both Balance tasks:  Cone touches 3 x 10  Lunges to BOSU fwd x 10 each LE,  patient began having some bursa pain on left so we instructed him to stop but he continued  attempting the lateral lunges.  He stopped after  1-2 reps each side once PT explained that we do not want the bursa to become overly irritated again.   Ice to left hip initially in sitting but patient kept shifting as if he was not able to get the ice to the correct spot, asked if patient would like to do ice in side lying.  Patient opted for this, typically we do right side lying but patient wanted to lie on left side.  Did 10 min lying on left hip.    02/17/24 Nustep x 5 min level 5 (patient reminded to back off on aggressiveness Supine IT band stretch 3 x 30 left Patient wanted to review several of the stretches on his HEP and then mentioned that there were some stretches that we had given him that weren't on his HEP.  PT examined HEP and all stretches that we had done in clinic were on the HEP.  Asked patient if he recalls what was not included on the HEP and he could not recall.  Also reminded patient that we don't give every single Standing adductor lunge stretch 3 x 30 Standing Captain Morgan stretch 3 x 30 Standing hamstring stretch while reviewing all other HEP 3 x 30 Trigger Point Dry Needling Initial Treatment: Pt instructed on Dry Needling rational, procedures, and possible side effects. Pt instructed to expect mild to moderate muscle soreness later in the day and/or into the next day.  Pt instructed in methods to reduce muscle soreness. Pt instructed to continue prescribed HEP. Because Dry Needling was performed over or adjacent to a lung field, pt was educated on S/S of pneumothorax and to seek immediate medical attention should they occur.  Patient was educated on signs and symptoms of infection and other risk factors and advised to seek medical attention should they occur.  Patient verbalized understanding of these instructions and education.  Patient Verbal Consent Given: Yes Education Handout Provided: Yes Muscles Treated: lateral quad Electrical Stimulation Performed:  No Treatment Response/Outcome: Skilled palpation used to identify taut bands and trigger points.  Once identified, dry needling techniques used to treat these areas.  Twitch response ellicited along with palpable elongation of muscle.  Following treatment, patient reported the left leg felt less full.     02/12/24 Standing hamstring stretch 3 x 30 Standing quad/hip flexor stretch 3 x 30 Seated piriformis stretch 3 x 30 Time expired due to conversation and instruction  12/31/2023  Initial Evaluation & HEP created     PATIENT EDUCATION:  Education details: PT eval findings, anticipated POC, progress with PT, and initial HEP Person educated: Patient Education method: Explanation, Demonstration, and Handouts Education comprehension: verbalized understanding, returned demonstration, and needs further education  HOME EXERCISE PROGRAM: Access Code: [email or web address] URL: [email or web address] Date: 02/12/2024 Prepared by: Delon Haddock  Exercises - Supine Hip Internal and External Rotation  - 1 x daily - 7 x weekly - 1 sets - 10 reps - 5 hold - Seated Hamstring Stretch  - 1 x daily - 7 x weekly - 2 sets - 10 reps - 20-30s hold - Supine ITB Stretch with Strap  - 1 x daily - 7 x weekly - 2 sets - 20-30 hold - Supine Hamstring Stretch with Strap  - 1 x daily - 7 x weekly - 2 sets - 20-30s hold - Supine Bridge  - 1 x daily - 7 x weekly - 2 sets - 5 reps - 10 hold - Standard Lunge  - 1 x daily - 7 x weekly - 1  sets - 10 reps - Lateral Lunge  - 1 x daily - 7 x weekly - 1 sets - 10 reps - Side Stepping with Resistance at Feet  - 1 x daily - 7 x weekly - 3 sets - 10 reps - Standing Hamstring Stretch on Chair  - 1 x daily - 7 x weekly - 3 sets - 10 reps - Quadricep Stretch with Chair and Counter Support  - 1 x daily - 7 x weekly - 3 sets - 10 reps  ASSESSMENT:  CLINICAL IMPRESSION: Marc Thornton is reaching max potential.  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  He is very well motivated.  He is doing a little better with following PT instruction vs self directing but continues to be overly aggressive at times which exacerbates his bursa symptoms.       Eval: Patient is a 78 y.o. male who was seen today for physical therapy evaluation and treatment for left hip pain. Marc Thornton presents to skilled therapy with chronic left hip pain that recently flared up 3-4 months ago. He has pain instantly in the mornings when he gets up and it usually goes away with movement, but recently he has not gotten any relief. He walks 5x a week for 45 mins with a walking group. He enjoys gardening and doing yard work but his left hip gives him some pain but he pushes through it. His sleep is greatly disturbed by his left hip pain. Based on evaluation noted muscle weakness, decreased ROM, poor posture, and decreased flexibility. Patient is motivated and wants to be educated on how to manage pain. Patient will benefit from skilled PT to address the below impairments and improve overall function.   OBJECTIVE IMPAIRMENTS: Abnormal gait, decreased balance, decreased coordination, decreased mobility, difficulty walking, decreased ROM, decreased strength, hypomobility, impaired perceived functional ability, increased muscle spasms, impaired flexibility, impaired sensation, improper body mechanics, postural dysfunction, and pain.   ACTIVITY LIMITATIONS: bending, sitting, standing, squatting, sleeping, stairs, dressing, hygiene/grooming, and locomotion level  PARTICIPATION LIMITATIONS: meal prep, cleaning, laundry, interpersonal relationship, community activity, and yard work  PERSONAL FACTORS: Age and 1-2 comorbidities: OA; peripheral neuropathy  are also affecting patient's functional outcome.   REHAB POTENTIAL: Good  CLINICAL DECISION MAKING: Evolving/moderate complexity  EVALUATION COMPLEXITY: Moderate   GOALS: Goals reviewed with patient? Yes  SHORT TERM GOALS:  Target date: 01/28/2024  Patient will be independent with initial HEP. Baseline:  Goal status: MET 01/13/24  2.  Patient will report > or = to 30% improvement in sleep quality since starting PT. Baseline:  Goal status: INITIAL  3.  Patient will be able to perform 5STS test with no losses of balance. Baseline: anterior LOB Goal status: MET 01/29/24  4.  Patient will be able to participate in  to establish baseline for functional test.  Baseline:  Goal status: MET 01/13/24   LONG TERM GOALS: Target date: 02/25/2024  Patient will demonstrate independence in advanced HEP. Baseline:  Goal status: MET 02/17/24  2.  Patient will report > or = to 50% improvement in left hip pain since starting PT. Baseline:  Goal status: In Progress  3.  Patient will be able to garden with < or = to 3/10 left hip pain. Baseline:  Goal status: In Progress  4.  Patient will demonstrate at least 4+/5 strength on left for improved performance of sit to stand and community negotiation. Baseline:  Goal status: In  Progress  5.  Patient will score > or = to 66/80 on LEFS due to improved function and decreased disability. Baseline: 56/80  Goal status: In progress    PLAN:  PT FREQUENCY: 2x/week  PT DURATION: 8 weeks  PLANNED INTERVENTIONS: 97164- PT Re-evaluation, 97110-Therapeutic exercises, 97530- Therapeutic activity, 97112- Neuromuscular re-education, 97535- Self Care, [id number]- Manual therapy, [phone]- Gait training, [phone]- Canalith repositioning, [id number]- Aquatic Therapy, [phone]- Electrical stimulation (unattended), [phone]- Electrical stimulation (manual), [id number]- Vasopneumatic device, [id number]- Ultrasound, [id number]- Traction (mechanical), [id number]- Ionotophoresis 4mg /ml Dexamethasone, 20560 (1-2 muscles), 20561 (3+ muscles)- Dry Needling, Patient/Family education, Balance training, Stair training, Taping, Joint mobilization, Joint manipulation, Spinal manipulation, Spinal mobilization, Vestibular training,  Cryotherapy, and Moist heat  PLAN FOR NEXT SESSION:  NuStep, hip flexibility; postural strengthening; hip strengthening, gait mechanics   Paytyn Mesta B. Ashir Kunz, PT 02/19/24 3:44 PM [facility] [address], [address] [place], [place] [address] Phone # [phone] Fax [phone]

## 2024-02-24 ENCOUNTER — Ambulatory Visit

## 2024-02-24 DIAGNOSIS — R262 Difficulty in walking, not elsewhere classified: Secondary | ICD-10-CM | POA: Diagnosis not present

## 2024-02-24 DIAGNOSIS — R252 Cramp and spasm: Secondary | ICD-10-CM

## 2024-02-24 DIAGNOSIS — M6281 Muscle weakness (generalized): Secondary | ICD-10-CM

## 2024-02-24 DIAGNOSIS — M25552 Pain in left hip: Secondary | ICD-10-CM

## 2024-02-24 DIAGNOSIS — R293 Abnormal posture: Secondary | ICD-10-CM

## 2024-02-24 NOTE — Therapy (Signed)
 OUTPATIENT PHYSICAL THERAPY LOWER EXTREMITY TREATMENT NOTE PHYSICAL THERAPY DISCHARGE SUMMARY  Visits from Start of Care: 15  Current functional level related to goals / functional outcomes: See below   Remaining deficits: See below   Education / Equipment: See Below  Patient agrees to discharge. Patient goals were met. Patient is being discharged due to meeting the stated rehab goals.    [name] MRN: [id number] DOB:08-04-1945, 78 y.o., male Today's Date: 02/24/2024  END OF SESSION:  PT End of Session - 02/24/24 1508     Visit Number 15    Number of Visits 16    Date for Recertification  02/25/24    Authorization Type [employer] Approved 16 vls 12/31/23-02/26/24    Authorization - Visit Number 15    Authorization - Number of Visits 16    Progress Note Due on Visit 20    PT Start Time 1455    PT Stop Time 1535    PT Time Calculation (min) 40 min    Activity Tolerance Patient tolerated treatment well    Behavior During Therapy WFL for tasks assessed/performed            Past Medical History:  Diagnosis Date   Abnormality of gait    Arthritis    Atrial fibrillation (HCC)    BPH (benign prostatic hyperplasia)    Bradycardia    Cardiomyopathy (HCC)    Cervical disc disorder    Chicken pox    Closed fracture of one or more phalanges of foot    Clotting disorder    low platelets   Combined systolic and diastolic cardiac dysfunction    DDD (degenerative disc disease), cervical    Depression    Elevated PSA    Fracture of foot bone, right, closed 1964   GERD (gastroesophageal reflux disease)    Hammer toe    Hayfever    Hearing loss    Heart murmur    Hyperlipidemia    Neoplasm of uncertain behavior of skin    Pilonidal cyst with abscess    Polyneuropathy    Rheumatic fever    Seasonal allergies    Talipes cavus    Tinnitus    Past Surgical History:  Procedure Laterality Date   COLONOSCOPY     2006 normal exam   INGUINAL HERNIA  REPAIR     PILONIDAL CYST EXCISION  1966   WISDOM TOOTH EXTRACTION     Patient Active Problem List   Diagnosis Date Noted   Malignant neoplasm of prostate (HCC) 04/19/2022   Bilateral carpal tunnel syndrome 08/18/2020   Midline low back pain without sciatica 09/30/2019   Atrophy of muscle of right lower leg 09/13/2019   Lumbar radiculopathy 09/13/2019   Peripheral neuropathy 09/13/2019   Gait abnormality 08/19/2019   Paresthesia 08/19/2019   Neck pain 08/19/2019   Memory loss 08/19/2019   Chronic diastolic heart failure (HCC) 03/21/2016   Murmur, cardiac 01/15/2016   Dyspnea 01/15/2016   Abnormal ECG 01/15/2016   Hypertriglyceridemia 01/15/2016    PCP: Merna Huxley, NP  REFERRING PROVIDER: Merna Huxley, NP  REFERRING DIAG: [phone] (ICD-10-CM) - Left leg pain   THERAPY DIAG:  Pain in left hip  Muscle weakness (generalized)  Abnormal posture  Difficulty in walking, not elsewhere classified  Cramp and spasm  Rationale for Evaluation and Treatment: Rehabilitation  ONSET DATE: 3-4 months ago  SUBJECTIVE:   SUBJECTIVE STATEMENT: Patient was about 10 min late.  He explains that he was on a  call with the [employer] and got behind getting ready to leave.  He states no new issues.  Doing ok, no hip pain at the moment.       Eval: Patient presents with left hip pain that is usually worse in the mornings. He went to Dr. 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 He reports Dr. Hiram said he does not need a hip replacement now, but he will in the future. He went on two trips to [place] and the required a lot of sitting. He struggled to sit in the very low chairs and he would like to be able to do that. He walks 5x a week with a group for 45 mins.   PERTINENT HISTORY: Peripheral neuropathy ; OA; Depression; DDD; Hx low back pain PAIN:  02/24/24 Are you having pain? Yes: NPRS scale: 0/10 Pain location: front and side of his left quad Pain description:  achy Aggravating factors: getting out of bed; walking;  Relieving factors: stretching   PRECAUTIONS: None  RED FLAGS: None   WEIGHT BEARING RESTRICTIONS: No  FALLS:  Has patient fallen in last 6 months? No  LIVING ENVIRONMENT: Lives with: lives alone Lives in: House/apartment Stairs: No Has following equipment at home: He has a cane, walker, shower chair, but he does not use anything  OCCUPATION: Retired   PLOF: Independent, Independent with basic ADLs, Independent with household mobility without device, Independent with community mobility without device, Independent with gait, Independent with transfers, and Leisure: Working in the yard, garden , working on cars  PATIENT GOALS: To get rid of pain  NEXT MD VISIT: PRN  OBJECTIVE:  Note: Objective measures were completed at Evaluation unless otherwise noted.  DIAGNOSTIC FINDINGS: Patient reports Lt hip arthritis (Dr. Hiram)   PATIENT SURVEYS:  Initial eval: LEFS: 56/80= 70%  02/24/24: LEFS: 54/80= 68%   COGNITION: Overall cognitive status: Within functional limits for tasks assessed     SENSATION: Patient had peripheral neuropathy   MUSCLE LENGTH: Hamstrings: decreased bilateral Lt > Rt    POSTURE: rounded shoulders, forward head, increased thoracic kyphosis, and weight shift right  PALPATION: Tenderness around Lt hip joint. Patient has a deep achy pain in left hip pain.  LOWER EXTREMITY ROM: Left hip PROM limited in all directions  Lt >Rt  ; patient unable to achieve FABER position on left. Patient has pain at end ranges.  LOWER EXTREMITY MMT: Grossly 4+/5 on right; 4 to 4+/5 on Lt some pain with left hip abduction testing;   FUNCTIONAL TESTS:  5 times sit to stand: 9.18 sec no UE support;  uncontrolled descend; anterior LOB requiring stepping strategy   Timed up and go (TUG): 5.00 sec     : 1757.2 feet   02/24/24: 5 times sit to stand: 7.73 sec no UE support;  uncontrolled descend; anterior LOB  requiring stepping strategy   Timed up and go (TUG): 4.90 sec   GAIT:  Comments: Right lateral trunk lean; antalgic gait  TREATMENT DATE:  02/24/24 Nustep x 6 min level 5 Reviewed entire HEP Thorough discussion and guidance on DC plan, how to progress, reviewed activities that will irritate the bursa.  Discussed the the main underlying issue is likely the hip OA which has likely progressed since he saw Dr. Melodi.  But that he also is just very rigid and will likely have to just be diligent with his HEP and avoid sitting in one position for a prolonged period of time.   DC assessment completed  02/19/24 Nustep x 5 min level 5 (patient reminded to back off on aggressiveness Supine IT band stretch 2 x 30 both Supine adductor stretch 2 x 30  both Supine butterfly stretch 5 x 30 both Standing adductor lunge stretch 3 x 30 both Bridging x 20 Standing Captain Morgan stretch 3 x 30 both Balance tasks:  Cone touches 3 x 10  Lunges to BOSU fwd x 10 each LE,  patient began having some bursa pain on left so we instructed him to stop but he continued attempting the lateral lunges.  He stopped after 1-2 reps each side once PT explained that we do not want the bursa to become overly irritated again.   Ice to left hip initially in sitting but patient kept shifting as if he was not able to get the ice to the correct spot, asked if patient would like to do ice in side lying.  Patient opted for this, typically we do right side lying but patient wanted to lie on left side.  Did 10 min lying on left hip.    02/17/24 Nustep x 5 min level 5 (patient reminded to back off on aggressiveness Supine IT band stretch 3 x 30 left Patient wanted to review several of the stretches on his HEP and then mentioned that there were some stretches that we had given him that weren't on his HEP.   PT examined HEP and all stretches that we had done in clinic were on the HEP.  Asked patient if he recalls what was not included on the HEP and he could not recall.  Also reminded patient that we don't give every single Standing adductor lunge stretch 3 x 30 Standing Captain Morgan stretch 3 x 30 Standing hamstring stretch while reviewing all other HEP 3 x 30 Trigger Point Dry Needling Initial Treatment: Pt instructed on Dry Needling rational, procedures, and possible side effects. Pt instructed to expect mild to moderate muscle soreness later in the day and/or into the next day.  Pt instructed in methods to reduce muscle soreness. Pt instructed to continue prescribed HEP. Because Dry Needling was performed over or adjacent to a lung field, pt was educated on S/S of pneumothorax and to seek immediate medical attention should they occur.  Patient was educated on signs and symptoms of infection and other risk factors and advised to seek medical attention should they occur.  Patient verbalized understanding of these instructions and education.  Patient Verbal Consent Given: Yes Education Handout Provided: Yes Muscles Treated: lateral quad Electrical Stimulation Performed: No Treatment Response/Outcome: Skilled palpation used to identify taut bands and trigger points.  Once identified, dry needling techniques used to treat these areas.  Twitch response ellicited along with palpable elongation of muscle.  Following treatment, patient reported the left leg felt less full.     02/12/24 Standing hamstring stretch 3 x 30 Standing quad/hip flexor stretch 3 x 30 Seated piriformis stretch 3 x 30 Time expired due to conversation and instruction  12/31/2023  Initial Evaluation & HEP created     PATIENT EDUCATION:  Education details: PT eval findings, anticipated POC, progress with PT, and initial HEP Person educated: Patient Education method: Explanation, Demonstration, and  Handouts Education comprehension: verbalized understanding, returned demonstration, and needs further education  HOME EXERCISE PROGRAM: Access Code: [email or web address] URL: [email or web address] Date: 02/12/2024 Prepared by: Delon Haddock  Exercises - Supine Hip Internal and External Rotation  - 1 x daily - 7 x weekly - 1 sets - 10 reps - 5 hold - Seated Hamstring Stretch  - 1 x daily - 7 x weekly - 2 sets - 10 reps - 20-30s hold - Supine ITB Stretch with Strap  - 1 x daily - 7 x weekly - 2 sets - 20-30 hold - Supine Hamstring Stretch with Strap  - 1 x daily - 7 x weekly - 2 sets - 20-30s hold - Supine Bridge  - 1 x daily - 7 x weekly - 2 sets - 5 reps - 10 hold - Standard Lunge  - 1 x daily - 7 x weekly - 1 sets - 10 reps - Lateral Lunge  - 1 x daily - 7 x weekly - 1 sets - 10 reps - Side Stepping with Resistance at Feet  - 1 x daily - 7 x weekly - 3 sets - 10 reps - Standing Hamstring Stretch on Chair  - 1 x daily - 7 x weekly - 3 sets - 10 reps - Theatre Manager with Chair and Counter Support  - 1 x daily - 7 x weekly - 3 sets - 10 reps  ASSESSMENT:  CLINICAL IMPRESSION: Jr has reached max potential.  He is actually a very active, fairly fit patient but has a high level of joint immobility and rigidity that has not been resolved tremendously with the amount of stretching we have done.  He has good underlying strength and function.  His functional scores improved.  His LEFS score is about the same.  He should continue to do well with commitment to his HEP and avoiding prolonged sitting at his desk.  We will DC at this time with max potential met.       Eval: Patient is a 78 y.o. male who was seen today for physical therapy evaluation and treatment for left hip pain. Marc Thornton presents to skilled therapy with chronic left hip pain that recently flared up 3-4 months ago. He has pain instantly in the mornings when he gets up and it usually goes away with movement, but recently he has  not gotten any relief. He walks 5x a week for 45 mins with a walking group. He enjoys gardening and doing yard work but his left hip gives him some pain but he pushes through it. His sleep is greatly disturbed by his left hip pain. Based on evaluation noted muscle weakness, decreased ROM, poor posture, and decreased flexibility. Patient is motivated and wants to be educated on how to manage pain. Patient will benefit from skilled PT to address the below impairments and improve overall function.   OBJECTIVE IMPAIRMENTS: Abnormal gait, decreased balance, decreased coordination, decreased mobility, difficulty walking, decreased ROM, decreased strength, hypomobility, impaired perceived functional ability, increased muscle spasms, impaired flexibility, impaired sensation, improper body mechanics, postural dysfunction, and pain.   ACTIVITY LIMITATIONS: bending, sitting, standing, squatting, sleeping, stairs, dressing, hygiene/grooming, and locomotion level  PARTICIPATION LIMITATIONS: meal prep, cleaning, laundry, interpersonal relationship, community activity, and yard work  PERSONAL FACTORS: Age and 1-2 comorbidities: OA; peripheral  neuropathy  are also affecting patient's functional outcome.   REHAB POTENTIAL: Good  CLINICAL DECISION MAKING: Evolving/moderate complexity  EVALUATION COMPLEXITY: Moderate   GOALS: Goals reviewed with patient? Yes  SHORT TERM GOALS: Target date: 01/28/2024  Patient will be independent with initial HEP. Baseline:  Goal status: MET 01/13/24  2.  Patient will report > or = to 30% improvement in sleep quality since starting PT. Baseline:  Goal status: MET 01/29/24  3.  Patient will be able to perform 5STS test with no losses of balance. Baseline: anterior LOB Goal status: MET 01/29/24  4.  Patient will be able to participate in  to establish baseline for functional test.  Baseline:  Goal status: MET 01/13/24   LONG TERM GOALS: Target date:  02/25/2024  Patient will demonstrate independence in advanced HEP. Baseline:  Goal status: MET 02/17/24  2.  Patient will report > or = to 50% improvement in left hip pain since starting PT. Baseline:  Goal status: MET on most days 02/24/24  3.  Patient will be able to garden with < or = to 3/10 left hip pain. Baseline:  Goal status: Has not been gardening   4.  Patient will demonstrate at least 4+/5 strength on left for improved performance of sit to stand and community negotiation. Baseline:  Goal status: MET 02/24/24  5.  Patient will score > or = to 66/80 on LEFS due to improved function and decreased disability. Baseline: 56/80  Goal status: NOT MET    PLAN:  PT FREQUENCY: 2x/week  PT DURATION: 8 weeks  PLANNED INTERVENTIONS: 97164- PT Re-evaluation, 97110-Therapeutic exercises, 97530- Therapeutic activity, 97112- Neuromuscular re-education, 97535- Self Care, 02859- Manual therapy, [id number]- Gait training, [phone]- Canalith repositioning, [id number]- Aquatic Therapy, [phone]- Electrical stimulation (unattended), [phone]- Electrical stimulation (manual), [id number]- Vasopneumatic device, [id number]- Ultrasound, [id number]- Traction (mechanical), [id number]- Ionotophoresis 4mg /ml Dexamethasone, [id number] (1-2 muscles), [id number] (3+ muscles)- Dry Needling, Patient/Family education, Balance training, Stair training, Taping, Joint mobilization, Joint manipulation, Spinal manipulation, Spinal mobilization, Vestibular training, Cryotherapy, and Moist heat  PLAN FOR NEXT SESSION:  DC with max potential reached.    Delon B. Emitt Maglione, PT 02/24/24 5:38 PM [facility] [address] [place], [place] [address] Phone # [phone] Fax [phone]

## 2024-03-12 ENCOUNTER — Encounter: Payer: Self-pay | Admitting: Adult Health

## 2024-03-12 ENCOUNTER — Ambulatory Visit: Payer: No Typology Code available for payment source | Admitting: Adult Health

## 2024-03-12 VITALS — BP 120/70 | HR 57 | Temp 99.0°F | Ht 71.0 in | Wt 186.0 lb

## 2024-03-12 DIAGNOSIS — I739 Peripheral vascular disease, unspecified: Secondary | ICD-10-CM

## 2024-03-12 DIAGNOSIS — E782 Mixed hyperlipidemia: Secondary | ICD-10-CM

## 2024-03-12 DIAGNOSIS — Z23 Encounter for immunization: Secondary | ICD-10-CM

## 2024-03-12 DIAGNOSIS — Z8546 Personal history of malignant neoplasm of prostate: Secondary | ICD-10-CM

## 2024-03-12 DIAGNOSIS — E559 Vitamin D deficiency, unspecified: Secondary | ICD-10-CM

## 2024-03-12 DIAGNOSIS — Z0000 Encounter for general adult medical examination without abnormal findings: Secondary | ICD-10-CM

## 2024-03-12 DIAGNOSIS — I5032 Chronic diastolic (congestive) heart failure: Secondary | ICD-10-CM

## 2024-03-12 DIAGNOSIS — I422 Other hypertrophic cardiomyopathy: Secondary | ICD-10-CM

## 2024-03-12 LAB — COMPREHENSIVE METABOLIC PANEL WITH GFR
ALT: 14 U/L (ref 0–53)
AST: 24 U/L (ref 0–37)
Albumin: 4.3 g/dL (ref 3.5–5.2)
Alkaline Phosphatase: 65 U/L (ref 39–117)
BUN: 28 mg/dL — ABNORMAL HIGH (ref 6–23)
CO2: 30 meq/L (ref 19–32)
Calcium: 9.4 mg/dL (ref 8.4–10.5)
Chloride: 103 meq/L (ref 96–112)
Creatinine, Ser: 0.89 mg/dL (ref 0.40–1.50)
GFR: 81.28 mL/min (ref >60.00)
Glucose, Bld: 81 mg/dL (ref 70–99)
Potassium: 4.3 meq/L (ref 3.5–5.1)
Sodium: 139 meq/L (ref 135–145)
Total Bilirubin: 1 mg/dL (ref 0.2–1.2)
Total Protein: 7.2 g/dL (ref 6.0–8.3)

## 2024-03-12 LAB — LIPID PANEL
Cholesterol: 132 mg/dL (ref 0–200)
HDL: 39 mg/dL — ABNORMAL LOW (ref >39.00)
LDL Cholesterol: 76 mg/dL (ref 0–99)
NonHDL: 92.56
Total CHOL/HDL Ratio: 3
Triglycerides: 84 mg/dL (ref 0.0–149.0)
VLDL: 16.8 mg/dL (ref 0.0–40.0)

## 2024-03-12 LAB — VITAMIN D 25 HYDROXY (VIT D DEFICIENCY, FRACTURES): VITD: 32 ng/mL (ref 30.00–100.00)

## 2024-03-12 LAB — CBC
HCT: 41.8 % (ref 39.0–52.0)
Hemoglobin: 14.1 g/dL (ref 13.0–17.0)
MCHC: 33.7 g/dL (ref 30.0–36.0)
MCV: 93.7 fl (ref 78.0–100.0)
Platelets: 140 K/uL — ABNORMAL LOW (ref 150.0–400.0)
RBC: 4.46 Mil/uL (ref 4.22–5.81)
RDW: 13.3 % (ref 11.5–15.5)
WBC: 7.2 K/uL (ref 4.0–10.5)

## 2024-03-12 LAB — PSA: PSA: 3.55 ng/mL (ref 0.10–4.00)

## 2024-03-12 LAB — TSH: TSH: 1.49 u[IU]/mL (ref 0.35–5.50)

## 2024-03-12 NOTE — Progress Notes (Signed)
 Subjective:    Patient ID: Marc Thornton, male    DOB: July 27, 1945, 78 y.o.   MRN: [id number]  HPI Patient presents for yearly preventative medicine examination. He is a 78 year old male who  has a past medical history of Abnormality of gait, Arthritis, Atrial fibrillation (HCC), BPH (benign prostatic hyperplasia), Bradycardia, Cardiomyopathy (HCC), Cervical disc disorder, Chicken pox, Closed fracture of one or more phalanges of foot, Clotting disorder, Combined systolic and diastolic cardiac dysfunction, DDD (degenerative disc disease), cervical, Depression, Elevated PSA, Fracture of foot bone, right, closed (1964), GERD (gastroesophageal reflux disease), Hammer toe, Hayfever, Hearing loss, Heart murmur, Hyperlipidemia, Neoplasm of uncertain behavior of skin, Pilonidal cyst with abscess, Polyneuropathy, Rheumatic fever, Seasonal allergies, Talipes cavus, and Tinnitus.  CHF -managed by cardiology.  He does have some limitation on functional ability on formal stress testing although he did not have any symptoms during activities of daily living.  He does not require diuretics at this time.  Evidence of on echo of reduced diastolic relaxation properties of the left ventricle.  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  This was confirmed by MRI of typical findings on EKG.  He has no history of complex ventricular arrhythmia, history of syncope, or family history of sudden cardiac death or arrhythmia.  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  Cognitive decline with family history of vascular dementia. He is currently doing a study through the [facility].    Prostate Cancer - underwent cryosurgical ablation of the prostate in October 2024 at [facility].  Vitamin D  deficiency  - takes 2000  units daily Last vitamin D  Lab Results  Component Value Date   VD25OH 30.41 03/12/2023   Hyperlipidemia - not currently on medication. He  Lab Results  Component Value Date   CHOL 149 03/12/2023   HDL 38.20 (L) 03/12/2023   LDLCALC 90 03/12/2023   TRIG 108.0 03/12/2023   CHOLHDL 4 03/12/2023    All immunizations and health maintenance protocols were reviewed with the patient and needed orders were placed.  Appropriate screening laboratory values were ordered for the patient including screening of hyperlipidemia, renal function and hepatic function. If indicated by BPH, a PSA was ordered.  Medication reconciliation,  past medical history, social history, problem list and allergies were reviewed in detail with the patient  Goals were established with regard to weight loss, exercise, and  diet in compliance with medications  Review of Systems  Constitutional: Negative.   HENT: Negative.    Eyes: Negative.   Respiratory: Negative.    Cardiovascular: Negative.   Gastrointestinal: Negative.   Endocrine: Negative.   Genitourinary: Negative.   Musculoskeletal:  Positive for arthralgias, back pain and gait problem.  Skin: Negative.   Allergic/Immunologic: Negative.   Hematological: Negative.   Psychiatric/Behavioral: Negative.    All other systems reviewed and are negative.  Past Medical History:  Diagnosis Date   Abnormality of gait    Arthritis    Atrial fibrillation (HCC)    BPH (benign prostatic hyperplasia)    Bradycardia    Cardiomyopathy (HCC)    Cervical disc disorder    Chicken pox    Closed fracture of one or more phalanges of foot    Clotting disorder    low platelets   Combined systolic and diastolic cardiac dysfunction  DDD (degenerative disc disease), cervical    Depression    Elevated PSA    Fracture of foot bone, right, closed 1964   GERD (gastroesophageal reflux disease)    Hammer toe    Hayfever    Hearing loss    Heart murmur     Hyperlipidemia    Neoplasm of uncertain behavior of skin    Pilonidal cyst with abscess    Polyneuropathy    Rheumatic fever    Seasonal allergies    Talipes cavus    Tinnitus     Social History   Socioeconomic History   Marital status: Widowed    Spouse name: Not on file   Number of children: Not on file   Years of education: Not on file   Highest education level: Professional school degree (e.g., MD, DDS, DVM, JD)  Occupational History   Occupation: Retired  Tobacco Use   Smoking status: Former    Current packs/day: 0.00    Average packs/day: 2.0 packs/day for 9.0 years (18.0 ttl pk-yrs)    Types: Cigarettes    Start date: 29    Quit date: 1969    Years since quitting: 56.9   Smokeless tobacco: Former  Building Services Engineer status: Never Used  Substance and Sexual Activity   Alcohol use: Yes    Comment: on occasion, maybe 3-4 times per month   Drug use: No   Sexual activity: Not on file  Other Topics Concern   Not on file  Social History Narrative   Lives alone   Right handed   Caffeine: 1-2 cups tea/day and 1 cup coffee some  days   Social Drivers of Corporate Investment Banker Strain: Low Risk  (12/29/2023)   Overall Financial Resource Strain (CARDIA)    Difficulty of Paying Living Expenses: Not hard at all  Food Insecurity: No Food Insecurity (12/29/2023)   Hunger Vital Sign    Worried About Running Out of Food in the Last Year: Never true    Ran Out of Food in the Last Year: Never true  Transportation Needs: No Transportation Needs (12/29/2023)   PRAPARE - Administrator, Civil Service (Medical): No    Lack of Transportation (Non-Medical): No  Physical Activity: Sufficiently Active (12/29/2023)   Exercise Vital Sign    Days of Exercise per Week: 5 days    Minutes of Exercise per Session: 30 min  Stress: No Stress Concern Present (12/29/2023)   [employer] of Occupational Health - Occupational Stress Questionnaire    Feeling of Stress:  Only a little  Social Connections: Socially Isolated (12/29/2023)   Social Connection and Isolation Panel    Frequency of Communication with Friends and Family: More than three times a week    Frequency of Social Gatherings with Friends and Family: More than three times a week    Attends Religious Services: Never    Database Administrator or Organizations: No    Attends Banker Meetings: Not on file    Marital Status: Widowed  Intimate Partner Violence: Not At Risk (08/21/2022)   Humiliation, Afraid, Rape, and Kick questionnaire    Fear of Current or Ex-Partner: No    Emotionally Abused: No    Physically Abused: No    Sexually Abused: No    Past Surgical History:  Procedure Laterality Date   COLONOSCOPY     2006 normal exam   INGUINAL HERNIA REPAIR     PILONIDAL CYST EXCISION  1966   WISDOM TOOTH EXTRACTION      Family History  Problem Relation Age of Onset   Arthritis Mother    Breast cancer Mother    Arthritis Father    Heart disease Father    Stroke Father    Hypertension Father    Prostate cancer Father    Dementia Father        vascular    Stroke Paternal Grandfather    Stroke Maternal Grandmother    Dementia Paternal Aunt        vascular   Dementia Paternal Uncle        vascular   Colon cancer Neg Hx    Esophageal cancer Neg Hx    Stomach cancer Neg Hx    Rectal cancer Neg Hx     Allergies  Allergen Reactions   Covid-19 (Subunit) Vaccine     Per pt  developed dizziness and heart issues   Metoprolol  Other (See Comments)   Oxybutynin Chloride Other (See Comments)    Current Outpatient Medications on File Prior to Visit  Medication Sig Dispense Refill   acetaminophen (TYLENOL) 325 MG tablet Take by mouth as needed.     Betaine, Trimethylglycine, (TMG, TRIMETHYLGLYCINE, PO) Take by mouth. Taking 2 capsules     cholecalciferol (VITAMIN D3) 25 MCG (1000 UNIT) tablet Take 1,000 Units by mouth daily.     ibuprofen (ADVIL) 200 MG tablet Take 200  mg by mouth as needed.     ketoconazole (NIZORAL) 2 % shampoo Apply 1 application topically as directed. PRN     Naproxen Sodium (ALEVE PO) Take by mouth as needed. (Patient not taking: Reported on 03/12/2024)     silver sulfADIAZINE (SILVADENE) 1 % cream Apply topically. (Patient not taking: Reported on 03/12/2024)     triamcinolone  cream (KENALOG) 0.1 % Apply 1 application topically daily as needed. (Patient not taking: Reported on 03/12/2024)     No current facility-administered medications on file prior to visit.    BP 120/70   Pulse (!) 57   Temp 99 F (37.2 C) (Tympanic)   Ht 5' 11 (1.803 m)   Wt 186 lb (84.4 kg)   SpO2 97%   BMI 25.94 kg/m       Objective:   Physical Exam Vitals and nursing note reviewed.  Constitutional:      General: He is not in acute distress.    Appearance: Normal appearance. He is not ill-appearing.  HENT:     Head: Normocephalic and atraumatic.     Right Ear: Tympanic membrane, ear canal and external ear normal. There is no impacted cerumen.     Left Ear: Tympanic membrane, ear canal and external ear normal. There is no impacted cerumen.     Nose: Nose normal. No congestion or rhinorrhea.     Mouth/Throat:     Mouth: Mucous membranes are moist.     Pharynx: Oropharynx is clear.  Eyes:     Extraocular Movements: Extraocular movements intact.     Conjunctiva/sclera: Conjunctivae normal.     Pupils: Pupils are equal, round, and reactive to light.  Neck:     Vascular: No carotid bruit.  Cardiovascular:     Rate and Rhythm: Normal rate and regular rhythm.     Pulses: Normal pulses.     Heart sounds: No murmur heard.    No friction rub. No gallop.  Pulmonary:     Effort: Pulmonary effort is normal.     Breath sounds: Normal breath sounds.  Abdominal:     General: Abdomen is flat. Bowel sounds are normal. There is no distension.     Palpations: Abdomen is soft. There is no mass.     Tenderness: There is no abdominal tenderness. There is no  guarding or rebound.     Hernia: No hernia is present.  Musculoskeletal:        General: Normal range of motion.     Cervical back: Normal range of motion and neck supple.  Lymphadenopathy:     Cervical: No cervical adenopathy.  Skin:    General: Skin is warm and dry.     Capillary Refill: Capillary refill takes less than 2 seconds.  Neurological:     General: No focal deficit present.     Mental Status: He is alert and oriented to person, place, and time.  Psychiatric:        Mood and Affect: Mood normal.        Behavior: Behavior normal.        Thought Content: Thought content normal.        Judgment: Judgment normal.        Assessment & Plan:  1. Routine general medical examination at a health care facility (Primary) Today patient counseled on age appropriate routine health concerns for screening and prevention, each reviewed and up to date or declined. Immunizations reviewed and up to date or declined. Labs ordered and reviewed. Risk factors for depression reviewed and negative. Hearing function and visual acuity are intact. ADLs screened and addressed as needed. Functional ability and level of safety reviewed and appropriate. Education, counseling and referrals performed based on assessed risks today. Patient provided with a copy of personalized plan for preventive services.   2. Chronic diastolic heart failure (HCC)  - Euvolemic without diuretics. He appears well and is enjoying traveling   - Lipid panel; Future - TSH; Future - CBC; Future - Comprehensive metabolic panel with GFR; Future - Comprehensive metabolic panel with GFR - CBC - TSH - Lipid panel  3. Hypertrophic cardiomyopathy (HCC) - Follow up with Cardiology as directed  - Lipid panel; Future - TSH; Future - CBC; Future - Comprehensive metabolic panel with GFR; Future - Comprehensive metabolic panel with GFR - CBC - TSH - Lipid panel  4. Small vessel disease - Follow up with Neurology as directed -  Lipid panel; Future - TSH; Future - CBC; Future - Comprehensive metabolic panel with GFR; Future - Comprehensive metabolic panel with GFR - CBC - TSH - Lipid panel  5. History of prostate cancer - Per urology  - PSA; Future - PSA  6. Mixed hyperlipidemia - Consider statin. He understands that his insurance may not pat for the ' Omega Check' lab  - Lipid panel; Future - TSH; Future - CBC; Future - Comprehensive metabolic panel with GFR; Future - Comprehensive metabolic panel with GFR - CBC - TSH - Lipid panel - OmegaCheck(TM) (EPA+DPA+DHA); Future - OmegaCheck(TM) (EPA+DPA+DHA)  7. Vitamin D  deficiency  - VITAMIN D  25 Hydroxy (Vit-D Deficiency, Fractures); Future - VITAMIN D  25 Hydroxy (Vit-D Deficiency, Fractures)  8. Need for influenza vaccination  - Flu vaccine HIGH DOSE PF(Fluzone Trivalent)   Devan Babino, NP

## 2025-03-15 ENCOUNTER — Encounter: Admitting: Adult Health
